# Patient Record
Sex: Female | Born: 1935 | Race: White | Hispanic: No | Smoking: Former smoker
Health system: Southern US, Community
[De-identification: ages and names within clinical notes are randomized; demographics above are authoritative.]

## PROBLEM LIST (undated history)

## (undated) DIAGNOSIS — J189 Pneumonia, unspecified organism: Secondary | ICD-10-CM

## (undated) DIAGNOSIS — R51 Headache: Secondary | ICD-10-CM

## (undated) DIAGNOSIS — I739 Peripheral vascular disease, unspecified: Secondary | ICD-10-CM

## (undated) DIAGNOSIS — I10 Essential (primary) hypertension: Secondary | ICD-10-CM

## (undated) DIAGNOSIS — R112 Nausea with vomiting, unspecified: Secondary | ICD-10-CM

## (undated) DIAGNOSIS — T7840XA Allergy, unspecified, initial encounter: Secondary | ICD-10-CM

## (undated) DIAGNOSIS — E785 Hyperlipidemia, unspecified: Secondary | ICD-10-CM

## (undated) DIAGNOSIS — I4891 Unspecified atrial fibrillation: Secondary | ICD-10-CM

## (undated) DIAGNOSIS — M810 Age-related osteoporosis without current pathological fracture: Secondary | ICD-10-CM

## (undated) DIAGNOSIS — Z9889 Other specified postprocedural states: Secondary | ICD-10-CM

## (undated) DIAGNOSIS — K219 Gastro-esophageal reflux disease without esophagitis: Secondary | ICD-10-CM

## (undated) DIAGNOSIS — I499 Cardiac arrhythmia, unspecified: Secondary | ICD-10-CM

## (undated) HISTORY — PX: FRACTURE SURGERY: SHX138

## (undated) HISTORY — PX: CARDIAC ELECTROPHYSIOLOGY MAPPING AND ABLATION: SHX1292

## (undated) HISTORY — PX: BUNIONECTOMY: SHX129

## (undated) HISTORY — DX: Allergy, unspecified, initial encounter: T78.40XA

## (undated) HISTORY — DX: Hyperlipidemia, unspecified: E78.5

## (undated) HISTORY — DX: Age-related osteoporosis without current pathological fracture: M81.0

## (undated) HISTORY — DX: Essential (primary) hypertension: I10

## (undated) SURGERY — Surgical Case
Anesthesia: *Unknown

---

## 1999-09-24 ENCOUNTER — Ambulatory Visit (HOSPITAL_COMMUNITY): Admission: RE | Admit: 1999-09-24 | Discharge: 1999-09-24 | Payer: Self-pay | Admitting: Obstetrics & Gynecology

## 2006-05-06 ENCOUNTER — Encounter: Admission: RE | Admit: 2006-05-06 | Discharge: 2006-05-06 | Payer: Self-pay | Admitting: Cardiology

## 2006-05-12 ENCOUNTER — Encounter (INDEPENDENT_AMBULATORY_CARE_PROVIDER_SITE_OTHER): Payer: Self-pay | Admitting: Cardiology

## 2006-05-12 ENCOUNTER — Ambulatory Visit (HOSPITAL_COMMUNITY): Admission: RE | Admit: 2006-05-12 | Discharge: 2006-05-12 | Payer: Self-pay | Admitting: Cardiology

## 2007-01-08 ENCOUNTER — Emergency Department (HOSPITAL_COMMUNITY): Admission: EM | Admit: 2007-01-08 | Discharge: 2007-01-08 | Payer: Self-pay | Admitting: Emergency Medicine

## 2007-09-14 ENCOUNTER — Ambulatory Visit (HOSPITAL_COMMUNITY): Admission: RE | Admit: 2007-09-14 | Discharge: 2007-09-14 | Payer: Self-pay | Admitting: Cardiology

## 2007-09-22 ENCOUNTER — Encounter: Admission: RE | Admit: 2007-09-22 | Discharge: 2007-09-22 | Payer: Self-pay | Admitting: Cardiology

## 2008-01-02 ENCOUNTER — Encounter: Admission: RE | Admit: 2008-01-02 | Discharge: 2008-01-02 | Payer: Self-pay | Admitting: Cardiology

## 2008-07-13 ENCOUNTER — Emergency Department (HOSPITAL_COMMUNITY): Admission: EM | Admit: 2008-07-13 | Discharge: 2008-07-13 | Payer: Self-pay | Admitting: Emergency Medicine

## 2008-09-25 ENCOUNTER — Ambulatory Visit (HOSPITAL_COMMUNITY): Admission: RE | Admit: 2008-09-25 | Discharge: 2008-09-25 | Payer: Self-pay | Admitting: Cardiology

## 2009-01-18 ENCOUNTER — Inpatient Hospital Stay (HOSPITAL_COMMUNITY): Admission: AD | Admit: 2009-01-18 | Discharge: 2009-01-23 | Payer: Self-pay | Admitting: Cardiology

## 2009-03-25 ENCOUNTER — Ambulatory Visit (HOSPITAL_COMMUNITY): Admission: RE | Admit: 2009-03-25 | Discharge: 2009-03-25 | Payer: Self-pay | Admitting: Cardiology

## 2009-03-25 ENCOUNTER — Encounter (INDEPENDENT_AMBULATORY_CARE_PROVIDER_SITE_OTHER): Payer: Self-pay | Admitting: Cardiology

## 2009-08-23 DIAGNOSIS — G43709 Chronic migraine without aura, not intractable, without status migrainosus: Secondary | ICD-10-CM | POA: Insufficient documentation

## 2010-06-19 ENCOUNTER — Ambulatory Visit (HOSPITAL_COMMUNITY)
Admission: AD | Admit: 2010-06-19 | Discharge: 2010-06-19 | Payer: Self-pay | Source: Home / Self Care | Admitting: Cardiology

## 2010-10-09 ENCOUNTER — Ambulatory Visit (HOSPITAL_COMMUNITY)
Admission: RE | Admit: 2010-10-09 | Discharge: 2010-10-09 | Payer: Self-pay | Source: Home / Self Care | Attending: Cardiovascular Disease | Admitting: Cardiovascular Disease

## 2010-12-22 LAB — BASIC METABOLIC PANEL
BUN: 21 mg/dL (ref 6–23)
CO2: 27 mEq/L (ref 19–32)
Chloride: 110 mEq/L (ref 96–112)
GFR calc non Af Amer: >60 mL/min (ref >60)
Potassium: 4.5 mEq/L (ref 3.5–5.1)
Sodium: 144 mEq/L (ref 135–145)

## 2010-12-22 LAB — TSH: TSH: 1.675 u[IU]/mL (ref 0.350–4.500)

## 2010-12-22 LAB — CBC
HCT: 47.4 % — ABNORMAL HIGH (ref 36.0–46.0)
Hemoglobin: 15.8 g/dL — ABNORMAL HIGH (ref 12.0–15.0)
MCHC: 33.3 g/dL (ref 30.0–36.0)
Platelets: 151 10*3/uL (ref 150–400)

## 2010-12-22 LAB — PROTIME-INR: Prothrombin Time: 26.2 seconds — ABNORMAL HIGH (ref 11.6–15.2)

## 2010-12-22 LAB — MAGNESIUM: Magnesium: 2.2 mg/dL (ref 1.5–2.5)

## 2010-12-25 LAB — CBC
HCT: 48.7 % — ABNORMAL HIGH (ref 36.0–46.0)
MCH: 30.3 pg (ref 26.0–34.0)
MCHC: 33.1 g/dL (ref 30.0–36.0)
MCV: 91.7 fL (ref 78.0–100.0)
RDW: 14.4 % (ref 11.5–15.5)

## 2010-12-25 LAB — BASIC METABOLIC PANEL
BUN: 20 mg/dL (ref 6–23)
GFR calc Af Amer: >60 mL/min (ref >60)
Sodium: 142 mEq/L (ref 135–145)

## 2011-01-19 LAB — PROTIME-INR
INR: 2.5 — ABNORMAL HIGH (ref 0.00–1.49)
Prothrombin Time: 28.4 seconds — ABNORMAL HIGH (ref 11.6–15.2)

## 2011-01-21 LAB — DIFFERENTIAL
Basophils Absolute: 0 10*3/uL (ref 0.0–0.1)
Basophils Relative: 0 % (ref 0–1)
Eosinophils Absolute: 0.3 10*3/uL (ref 0.0–0.7)
Eosinophils Relative: 5 % (ref 0–5)
Lymphocytes Relative: 21 % (ref 12–46)
Monocytes Absolute: 0.8 10*3/uL (ref 0.1–1.0)
Monocytes Relative: 12 % (ref 3–12)
Neutro Abs: 4.5 10*3/uL (ref 1.7–7.7)
Neutrophils Relative %: 55 % (ref 43–77)

## 2011-01-21 LAB — BASIC METABOLIC PANEL
BUN: 26 mg/dL — ABNORMAL HIGH (ref 6–23)
BUN: 26 mg/dL — ABNORMAL HIGH (ref 6–23)
BUN: 35 mg/dL — ABNORMAL HIGH (ref 6–23)
BUN: 36 mg/dL — ABNORMAL HIGH (ref 6–23)
CO2: 31 mEq/L (ref 19–32)
Calcium: 8.8 mg/dL (ref 8.4–10.5)
Calcium: 9.2 mg/dL (ref 8.4–10.5)
Calcium: 9.2 mg/dL (ref 8.4–10.5)
Chloride: 101 mEq/L (ref 96–112)
Creatinine, Ser: 1.14 mg/dL (ref 0.4–1.2)
Creatinine, Ser: 1.17 mg/dL (ref 0.4–1.2)
Creatinine, Ser: 2.08 mg/dL — ABNORMAL HIGH (ref 0.4–1.2)
GFR calc Af Amer: 28 mL/min — ABNORMAL LOW (ref >60)
GFR calc non Af Amer: 23 mL/min — ABNORMAL LOW (ref >60)
GFR calc non Af Amer: 36 mL/min — ABNORMAL LOW (ref >60)
GFR calc non Af Amer: 45 mL/min — ABNORMAL LOW (ref >60)
Glucose, Bld: 111 mg/dL — ABNORMAL HIGH (ref 70–99)
Glucose, Bld: 118 mg/dL — ABNORMAL HIGH (ref 70–99)
Potassium: 4.3 mEq/L (ref 3.5–5.1)
Sodium: 140 mEq/L (ref 135–145)

## 2011-01-21 LAB — CARDIAC PANEL(CRET KIN+CKTOT+MB+TROPI)
CK, MB: 1.7 ng/mL (ref 0.3–4.0)
CK, MB: 2 ng/mL (ref 0.3–4.0)
CK, MB: 2.3 ng/mL (ref 0.3–4.0)
Total CK: 66 U/L (ref 7–177)
Total CK: 72 U/L (ref 7–177)
Total CK: 81 U/L (ref 7–177)
Troponin I: <0.01 ng/mL (ref 0.00–0.06)
Troponin I: <0.01 ng/mL (ref 0.00–0.06)

## 2011-01-21 LAB — CBC
HCT: 41 % (ref 36.0–46.0)
Hemoglobin: 14 g/dL (ref 12.0–15.0)
Hemoglobin: 14.6 g/dL (ref 12.0–15.0)
MCHC: 33.7 g/dL (ref 30.0–36.0)
MCHC: 34.1 g/dL (ref 30.0–36.0)
Platelets: 171 10*3/uL (ref 150–400)
RBC: 4.38 MIL/uL (ref 3.87–5.11)
RBC: 4.54 MIL/uL (ref 3.87–5.11)
RDW: 13.7 % (ref 11.5–15.5)
WBC: 4.8 10*3/uL (ref 4.0–10.5)
WBC: 6.8 10*3/uL (ref 4.0–10.5)

## 2011-01-21 LAB — PROTIME-INR
INR: 2 — ABNORMAL HIGH (ref 0.00–1.49)
INR: 2.1 — ABNORMAL HIGH (ref 0.00–1.49)
INR: 2.2 — ABNORMAL HIGH (ref 0.00–1.49)
Prothrombin Time: 23.5 seconds — ABNORMAL HIGH (ref 11.6–15.2)
Prothrombin Time: 24.8 seconds — ABNORMAL HIGH (ref 11.6–15.2)
Prothrombin Time: 25 seconds — ABNORMAL HIGH (ref 11.6–15.2)
Prothrombin Time: 25.9 seconds — ABNORMAL HIGH (ref 11.6–15.2)

## 2011-01-21 LAB — COMPREHENSIVE METABOLIC PANEL
ALT: 18 U/L (ref 0–35)
AST: 25 U/L (ref 0–37)
CO2: 31 mEq/L (ref 19–32)
Calcium: 9.7 mg/dL (ref 8.4–10.5)
Chloride: 99 mEq/L (ref 96–112)
GFR calc non Af Amer: 55 mL/min — ABNORMAL LOW (ref >60)

## 2011-01-21 LAB — BRAIN NATRIURETIC PEPTIDE
Pro B Natriuretic peptide (BNP): 243 pg/mL — ABNORMAL HIGH (ref 0.0–100.0)
Pro B Natriuretic peptide (BNP): 296 pg/mL — ABNORMAL HIGH (ref 0.0–100.0)
Pro B Natriuretic peptide (BNP): 91 pg/mL (ref 0.0–100.0)

## 2011-02-24 NOTE — Cardiovascular Report (Signed)
NAMEHANIFA, ANTONETTI NO.:  [id number]   MEDICAL RECORD NO.:  [id number]          PATIENT TYPE:  OIB   LOCATION:  2854                         FACILITY:  [facility]   PHYSICIAN:  Cristy Hilts. Jacinto Halim, MD       DATE OF BIRTH:  08/24/1928   DATE OF PROCEDURE:  09/14/2007  DATE OF DISCHARGE:                            CARDIAC CATHETERIZATION   PROCEDURE PERFORMED:  Direct current cardioversion.   INDICATION:  Atrial fibrillation, shortness of breath and fatigue.   TECHNIQUE:  Using Pentothal for deep sedation, initially 100 joules of  synchronized direct current cardioversion was delivered to the patient  which was unsuccessful, and then 120 joules x2 was delivered with  successful cardioversion from atrial fibrillation to sinus bradycardia.  Patient tolerated the procedure.  No immediate complications noted.      Cristy Hilts. Jacinto Halim, MD  Electronically Signed     JRG/MEDQ  D:  09/14/2007  T:  09/14/2007  Job:  [id number]

## 2011-02-24 NOTE — H&P (Signed)
NAMEAUDRY, Lutz                  ACCOUNT NO.:  [id number]   MEDICAL RECORD NO.:  [id number]          PATIENT TYPE:  INP   LOCATION:                               FACILITY:  [facility]   PHYSICIAN:  Vonna Kotyk R. Jacinto Halim, MD       DATE OF BIRTH:  1928-03-04   DATE OF ADMISSION:  01/18/2009  DATE OF DISCHARGE:                              HISTORY & PHYSICAL   REASON FOR ADMISSION:  1. Intractable back pain from spinal stenosis.  2. Acute on chronic diastolic heart failure.   HISTORY:  [name] is a pleasant 77 year old female with  paroxysmal atrial fibrillation.  She has recently undergone direct  current cardioversion sometime in December - January 2009 and 2010.  Since then she was also placed on Multaz for maintaining sinus rhythm.  She is extremely symptomatic with atrial fibrillation.  She was doing  well until about a month ago when she started noticing increasing lower  extremity edema.  She was recently in [place] about 2 weeks ago where  she had increasing shortness of breath, increasing lower extremity edema  where she went to the emergency room and was found to have mild  congestive heart failure.  She was eventually discharged home with  reassurance and to follow-up with orthopedic surgery for her severe back  pain and spinal stenosis.  She has been taking fentanyl patch and also  Percocet without any relief of her back pain.   The patient describes shortness of breath as a continuous increase on  lying down over the last 3-4 weeks but has especially gotten worse in  the last 1 week.  She was seen by my partner about 2 to 3 days ago and  was placed on Lasix.  With this she has had significant improvement in  her lower extremity edema but however she is still extremely short of  breath.   More importantly her issues have been severe back pain and inability to  walk to the bathroom without having to scream out loud.  She was seen by  Dr. Ethelene Hal and  was advised back surgery  because of severe spinal  stenosis and complex spinal stenosis.  She has had radicular  pain in  both the lower extremities this for the last 2-3 weeks and it has been  getting worse.  She has not had any bowel or bladder incontinence.   REVIEW OF SYSTEMS:  She has not had any bleeding diathesis on Coumadin.  She has no significant bowel or bladder disorder.  No neurologic  weakness.  She has not had any chest pain.  Other symptoms are negative.   PRESENT MEDICATIONS:  1. Coumadin 5 mg p.o. daily as per INR.  2. Lipitor 10 mg p.o. daily.  3. Timolol 10 mg p.o. b.i.d.  4. Calcium 600  mg p.o. b.i.d.  5. Fish oil capsule 1200 mg p.o. daily.  6. Allegra 180 mg p.o. daily.  7. Effexor XR 150 mg p.o. daily.  8. Multaq 400 mg p.o. b.i.d..  9. Fentanyl patch 50 mcg q.72 h.  10.Neurontin 300 mg p.o. b.i.d..  11.Actonel 70 mg p.o. q. 1 week.  12.Lasix 20 mg p.o. daily.  13.Klor-Con 10 mEq p.o. daily.  14.Vitamin D 50,000 International  Units p.o. daily.  15.Oxycodone, 10/325 p.o. daily.  16.Hydrocodone 5/500 q.6h p.r.n.Marland Kitchen   ALLERGIES:  IVP CONTRAST, CODEINE, SULFA AND LATEX.   PHYSICAL EXAMINATION:  She is well- built and well-nourished.  She  appears to be in acute distress.  VITAL SIGNS:  Include a heart rate of 58 beats per minute, respirations  16, blood pressure 130/86 mmHg.  Weight of 184 pounds.  CARDIOVASCULAR:  S1-S2 was normal without any gallops or murmur.  CHEST:  Clear.  ABDOMEN:  Soft.  EXTREMITIES:  Edema.  Peripheral arterial exam was normal.   IMPRESSION:  1. Severe spinal stenosis with intractable back pain with radicular      pain to her legs.  2. Acute on chronic diastolic heart failure.  3. Paroxysmal atrial fibrillation maintaining sinus rhythm.  4. Fluid overload state.  I suspect Multaq is  probably contributing      to her lower extremity edema.   RECOMMENDATIONS:  Given the patient is actually very minimal because of  pain in spite of being on narcotic,  we are going to admit her to the  hospital for pain control and also for control of her acute on chronic  diastolic heart failure.  I have discussed these findings with Dr. Ethelene Hal  who is going to have his partner, Dr. Shon Baton to see the patient in the  inpatient setting.   If Dr. Shon Baton feels that inpatient spinal surgery is required, she can  certainly undergo this surgery with acceptable cardiovascular risk.   I am going to hold off on her Multaq as I  think it has caused her to  have fluid overload state.   Her last echocardiogram was in January 18, 2009 which revealed normal LV  systolic function.   Her last stress test was on July 18, 2008 which  revealed no evidence  of ischemia and normal ejection fraction.  Please see orders for  medications and lab.      Cristy Hilts. Jacinto Halim, MD  Electronically Signed     JRG/MEDQ  D:  01/18/2009  T:  01/18/2009  Job:  [id number]

## 2011-02-24 NOTE — Op Note (Signed)
[name], [name] NO.:  [id number]   MEDICAL RECORD NO.:  [id number]          PATIENT TYPE:  OIB   LOCATION:  2899                         FACILITY:  [facility]   PHYSICIAN:  Cristy Hilts. Jacinto Halim, MD       DATE OF BIRTH:  04-24-1928   DATE OF PROCEDURE:  09/25/2008  DATE OF DISCHARGE:  09/25/2008                               OPERATIVE REPORT   PROCEDURE PERFORMED:  Synchronized direct current cardioversion for  atrial fibrillation, new onset.   HISTORY:  [name] is a 77 year old female with history of paroxysmal  atrial fibrillation.  She gets extremely symptomatic with generalized  fatigue, not feeling well with recurrent atrial fibrillation.  She had  undergone cardioversion in 2007 with was successful and had maintained  sinus rhythm, recently has been having short runs of palpitations and  generalized weakness.  However, about a week ago, she developed  significant marked lethargy and she was found to be in atrial  fibrillation.  Now, she is loaded with Multaq 4 mg p.o. b.i.d. and she  is brought in for an outpatient direct current cardioversion.  Her INR  has been therapeutic, and she has been on chronic Coumadin.   PROCEDURE DATA:  Using 90 mg of propofol for achieving deep sedation,  120 joules of synchronized biphasic direct current  was delivered to the  patient with successful cardioversion of atrial fibrillation to normal  sinus rhythm.  The patient tolerated the procedure well.  No immediate  complications.      Cristy Hilts. Jacinto Halim, MD  Electronically Signed     JRG/MEDQ  D:  09/25/2008  T:  09/26/2008  Job:  [id number]   cc:   Larina Earthly, M.D.

## 2011-02-24 NOTE — H&P (Signed)
[name], BUSSA NO.:  [id number]   MEDICAL RECORD NO.:  [id number]          PATIENT TYPE:  AMB   LOCATION:  ENDO                         FACILITY:  [facility]   PHYSICIAN:  Ritta Slot, MD     DATE OF BIRTH:  04/27/1928   DATE OF ADMISSION:  03/25/2009  DATE OF DISCHARGE:  03/25/2009                              HISTORY & PHYSICAL   This is a TEE-guided cardioversion.   PATIENT PROFILE:  [name] is a very pleasant 77 year old Caucasian  female who has had paroxysmal atrial fibrillation for a year.  She has  been treated with medical therapy including amiodarone, Multaq, and now  sotalol 120 mg p.o. b.i.d.  She has been seen by EP for evaluation of  atrial fibrillation and it was decided to proceed with cardioversion.  Initially, she should maintain sinus rhythm.  She is symptomatic,  treated with severe lethargy and shortness of breath.  After TEE was  performed, which demonstrated no evidence of LAA thrombus, but simply  small amount of smoke with a pulse wave velocity of 35 cm/sec in the  left atrial appendage, it was decided to proceed with cardioversion.   She was given 40 mg of propofol intravenously by general anesthesia Dr.  Michelle Piper.  She underwent 100 joules synchronized shock x1 restoring normal  sinus rhythm straight away.  She remained in normal sinus rhythm without  any return of atrial fibrillation.   PLAN:  She will be discharged home after 2 hours and continue on with  the medication.  She will follow up with Dr. Jacinto Halim in due course in the  next couple of weeks.      Ritta Slot, MD  Electronically Signed     HS/MEDQ  D:  03/25/2009  T:  03/26/2009  Job:  [id number]

## 2011-02-24 NOTE — Consult Note (Signed)
[name], Lutz NO.:  [id number]   MEDICAL RECORD NO.:  [id number]          PATIENT TYPE:  INP   LOCATION:  4728                         FACILITY:  [facility]   PHYSICIAN:  Alvy Beal, MD    DATE OF BIRTH:  1928/03/28   DATE OF CONSULTATION:  01/22/2009  DATE OF DISCHARGE:                                 CONSULTATION   REASON FOR CONSULTATION:  Bilateral lower extremity burning  dysesthesias.  History obtained from the patient, her daughter, and the  medical records.   HISTORY OF PRESENT ILLNESS:  [name] is a very active 77 year old woman who  has never complained of any significant back pain.  She would have  occasional discomfort but nothing of any great severity that would  require her to see a physician.  She has a history of acute on chronic  diastolic heart failure and in December or early January was started on  medication called, Multaq.  She states that she had a cardioversion and  was started on the medication.  Shortly after starting this medication,  she started noting nighttime burning dysesthesias.  During the day, she  would have no symptoms.  She was able to be active.  She was able to  ambulate.  She had no difficulty bending, twisting, turning, and never  complained of any significant back pain.  The pain persisted despite  medications.  Ultimately, she was on vacation with her daughter in [place]  and the pain got so severe that an MRI was done.  She recently returned  here and because of the severity of the symptoms was admitted on January 18, 2009.  The Multaq was eventually stopped.  She has now been off that  medication for the last 5-7 days.  She reports that since being off the  medication, nighttime burning dysesthesias that she was suffering with  have begun to improve.  There is still symptoms but overall she is  noting improvement for the first time since she began the medication.   Because the MRI showed significant changes,  Orthopedic Spine  consultation was requested.   At no point in time that she noted history of back pain.   PAST MEDICAL HISTORY:  1. Recent diagnosis of severe spinal stenosis.  2. Acute on chronic diastolic heart failure.  3. Paroxysmal AFib, maintaining sinus rhythm.  4. Fluid overload.   MEDICATIONS:  States she is on Coumadin, Lipitor, timolol, calcium, fish  oil, Allegra, and Effexor.  She recently stopped the Multaq and fentanyl  patch which was also recently discontinued.  Neurontin which was causing  her some balance problems and Actonel, Lasix, Klor-Con, and vitamin D.   She is also on Vicodin and was on Percocet in the past.   ALLERGIES:  She is allergic to IVP CONTRAST, CODEINE, SULFA, and LATEX.   PHYSICAL EXAMINATION:  She is a pleasant woman who is alert and oriented  x3.  Cranial nerves II-XII were tested, they are intact.  No thoracic,  lumbar or cervical pain with range of motion.  She has excellent forward  flexion and extension without pain.  She has no pain or discomfort with  direct palpation.  She has no hip, knee or ankle pain with isolated  joint range of motion.  Negative nerve root tension signs in the lower  extremity.  2+ symmetrical deep tendon reflexes bilaterally.  Absent  clonus.  Negative Babinski.  No Achilles reflex.  Intact distal pulses  in the lower extremity.   IMAGING:  Her MRI from [facility] was reviewed.  It does show a grade 2  degenerative spondylolisthesis at L4-L5 with posterior disk protrusion  causing significant spinal stenosis.   IMPRESSION AND PLAN:  At this point in time, the patient reports no  acute traumatic event or significant back pain.  The MRI findings I  believe are more of a chronic condition and although they are  significantly based on MRI, clinically she has no symptoms that would  relate to them.  She has no radiculopathy, she has no focal neurological  deficits, and she has no radicular leg pain.  More importantly,  there is  also no significant back pain.  At this point in time, I do not feel as  though she has intractable back pain.  My recommendation is to convert  her to Lyrica for the burning dysesthesias that she has and using oral  pain medication.  I would stop the IV Dilaudid, the fentanyl patch, and  continue her generalized therapy program.  If she does develop back pain  or significant radiculopathy, then she may require something surgical to  be done, but at this point I do not feel her symptoms are related to her  spine.  More than likely what has occurred as a result of a side effect  of the Multaq, she developed a peripheral neuropathy and has been  complaining of dysesthesias related to medicine side effect.  Once this  medicine was stopped, her symptoms have begun to resolve.  I am happy to  see her again in the future in about 2 months to track her overall  performance.  I will set her up with physical therapy to be done as an  outpatient and more than likely if she does well on the oral medications  we prescribed today, she can be discharged tomorrow.      Alvy Beal, MD  Electronically Signed     DDB/MEDQ  D:  01/22/2009  T:  01/23/2009  Job:  [id number]

## 2011-02-24 NOTE — Discharge Summary (Signed)
[name], [name]                  ACCOUNT NO.:  [id number]   MEDICAL RECORD NO.:  [id number]          PATIENT TYPE:  INP   LOCATION:  4728                         FACILITY:  [facility]   PHYSICIAN:  Cristy Hilts. Jacinto Halim, MD       DATE OF BIRTH:  March 25, 1928   DATE OF ADMISSION:  01/18/2009  DATE OF DISCHARGE:  01/23/2009                               DISCHARGE SUMMARY   DISCHARGE DIAGNOSES:  1. Intractable back pain improved secondary to spinal stenosis.  2. Acute on chronic diastolic heart failure resolved.  3. Volume overload secondary to Multaq.  4. Paroxysmal atrial fibrillation, now back on Cardizem and      maintaining sinus bradycardia at discharge.  5. Mild renal insufficiency, improved at discharge.  6. Anticoagulation with Coumadin, stable during hospitalization.  7. Severe chronic pain, now improved.   DISCHARGE CONDITION:  Improved.   PROCEDURES:  None.   DISCHARGE MEDICATIONS:  1. Lipitor 10 mg at bedtime as before.  2. Timolol 10 mg twice a day.  3. Effexor XR 150 mg every morning.  4. Coumadin 2.5 mg every evening.  5. Stop Multaq.  6. Lasix 20 mg as needed daily.  7. Potassium 10 mEq as needed daily which she will continue.  8. Calcium 600 plus vitamin D daily.  9. Percocet 5/325 one tablet every 4-6 hours as needed.  10.Fish oil as before 1200 mg daily.  11.Cardizem CD 120 mg daily this is new.  12.Allegra 180 mg daily as before.  13.Robaxin 500 mg 1 every 8 hours as needed for spasms.  14.Lyrica 75 mg one every evening.  15.Actonel weekly as before.  16.Vitamin D 50,000 units weekly as before.  17.Stop fentanyl, stop Neurontin, stop Cafergot, stop oxycodone, and      stop hydrocodone.   DISCHARGE INSTRUCTIONS:  Low-sodium heart-healthy diet.  May walk up  steps.  May shower.   FOLLOWUP:  1. Follow up with Dr. Jacinto Halim on February 01, 2009 at 10:45.  Please      reschedule as this is not a good time for you.  2. Follow up with Dr. Shon Baton in 4 weeks.   HISTORY OF  PRESENT ILLNESS:  A 77 year old female with history of atrial  fib, was seen by Dr. Jacinto Halim in the office on January 18, 2009 for  intractable back pain from spinal stenosis.  She has a history of PAF  and undergone direct cardioversion in December or January.  Since then,  she had been placed on Multaq for sinus rhythm.  The patient has  multiple symptoms with her AFib and had been doing well until a month  prior to the admission, and she began having increasing lower extremity  edema, then increasing shortness of breath.  She was placed on Lasix and  potassium, and she also had severe back pain and spinal stenosis and is  on fentanyl patch and Percocet without relief.  Shortness of breath  continued.  Her back pain was severe, inability to walk to the bathroom  without screaming.  She was admitted by Dr. Jacinto Halim for further  management.   ALLERGIES:  IVP CONTRAST, CODEINE, SULFA, and LATEX.   The patient was admitted and placed on IV Dilaudid.  During the weekend,  she had increasing back pain.  Also, her creatinine started to climb  slightly, so her Lasix was decreased and she was improving from diuretic  standpoint as well as from edema.  Has a history of EF greater than 55%.  The patient would have episodic PAF.  We stopped her Multaq due to the  edema and she was stabilizing.  Ortho consult with Dr. Shon Baton was done  with medication adjustment, stopping most pain medicines and then slowly  adding back.   The patient slowly improved and actually at discharge, she was stable.  She had had some atrial fib the morning of discharge.  She was started  on Cardizem CD 120.  She walked and her heart rate remained sinus  rhythm, so she was felt stable for discharge though if she is home and  has continued episodes of PAF, she would most likely need to go home on  sotalol.   PHYSICAL EXAMINATION:  VITAL SIGNS:  At discharge, blood pressure  123/81, pulse 82, temperature 97.2, sat oxygen stable.   HEART:  On discharge, heart sounds.  LUNGS:  Clear.  No rhonchi.  LOWER EXTREMITIES:  Without edema.  ABDOMEN:  Soft, nontender.  Positive bowel sounds.   LABORATORY DATA:  On admission, hemoglobin 14.6, hematocrit 43, WBC 4.8,  platelets 161 and prior to discharge, hemoglobin 13, hematocrit 40, WBC  6.8, platelets 171.  PT on admission 23.5, INR of 2, this maintained  therapeutic and at discharge, her protime is 24.8 with INR of 2.1.   Comprehensive metabolic panel on admission, sodium 138, potassium 3.7,  chloride 99, CO2 of 31, glucose 129, BUN 23, creatinine 0.98, total bili  0.6, alkaline phos 70, SGOT 25, SGPT 18, calcium 9.7.  BNP was 296 on  admission, the next day 243 and prior to discharge 91.   Follow-up basic metabolic panel was stable and at discharge, sodium 140,  potassium 4.4, chloride 101, CO2 of 31, glucose 96, BUN 26, creatinine  1.14.   Cardiac panels were negative for MI with CK 72, MB 2 and troponin I less  than 0.01.   RADIOLOGY:  Chest x-ray on admission, mild cardiac enlargement without  acute pulmonary process.   EKGs, initially sinus rhythm.  No acute changes.  Prior to discharge,  she had some atrial fib, no acute ST changes.   HOSPITAL COURSE:  As stated, the patient was admitted, underwent  diuresing for volume overload secondary to Multaq.  Her Multaq was  discontinued.  She did have some paroxysmal AFib and was started on p.o.  Cardizem.  Her back pain was her biggest problem during the  hospitalization.  Ortho consult was obtained and medication  adjustments with discontinuing fentanyl patch, Neurontin, oxycodone and  hydrocodone.  She was started on Lyrica with understanding she did not  want to be on that any long period of time.  The patient will follow up  as an outpatient as well as with Physical Therapy through Dr. Shon Baton.      Darcella Gasman. [place], N.P.      Cristy Hilts. Jacinto Halim, MD  Electronically Signed    LRI/MEDQ  D:  01/23/2009  T:   01/24/2009  Job:  [id number]   cc:   Alvy Beal, MD  Larina Earthly, M.D.  Dr. Ethelene Hal

## 2011-02-27 NOTE — Op Note (Signed)
NAMECHERRI, YERA                  ACCOUNT NO.:  [id number]   MEDICAL RECORD NO.:  [id number]          PATIENT TYPE:  AMB   LOCATION:  ENDO                         FACILITY:  [facility]   PHYSICIAN:  Vonna Kotyk R. Jacinto Halim, MD       DATE OF BIRTH:  01-19-1928   DATE OF PROCEDURE:  DATE OF DISCHARGE:                                 OPERATIVE REPORT   SURGEON:  Vonna Kotyk R. Jacinto Halim, MD.   PROCEDURE:  Transesophageal esophageal echocardiographically guided  electrical cardioversion.   INDICATIONS:  Miss Wendt is a 77 year old female with new onset of atrial  fibrillation.  Given the fact this is new onset atrial fibrillation and she  is well anticoagulated beyond 2 weeks, she was brought to the endoscopy  suite for a TEE-guided cardioversion.   DESCRIPTION OF PROCEDURE:  After the clot in the left atrial appendage and  the left atrium was ruled out, a direct-current  cardioversion was performed  using Pentothal for deep sedation; 80 mg Pentothal was administered  intravenously.  Direct-current cardioversion was performed in a synchronized  fashion using a biphasic defibrillator initially at 100 J x1, then 120 J x2,  with successful cardioversion from atrial fibrillation to normal sinus  rhythm.  The patient tolerated the procedure.  No immediate complication  noted.      Cristy Hilts. Jacinto Halim, MD  Electronically Signed     JRG/MEDQ  D:  05/12/2006  T:  05/12/2006  Job:  [id number]   cc:   Larina Earthly, M.D.

## 2011-04-02 ENCOUNTER — Ambulatory Visit (HOSPITAL_COMMUNITY)
Admission: RE | Admit: 2011-04-02 | Discharge: 2011-04-02 | Disposition: A | Discharge status: Home / Self Care | Payer: Medicare Other | Source: Ambulatory Visit | Attending: Cardiology | Admitting: Cardiology

## 2011-04-02 DIAGNOSIS — Z79899 Other long term (current) drug therapy: Secondary | ICD-10-CM | POA: Insufficient documentation

## 2011-04-02 DIAGNOSIS — M810 Age-related osteoporosis without current pathological fracture: Secondary | ICD-10-CM | POA: Insufficient documentation

## 2011-04-02 DIAGNOSIS — Z7901 Long term (current) use of anticoagulants: Secondary | ICD-10-CM | POA: Insufficient documentation

## 2011-04-02 DIAGNOSIS — I4891 Unspecified atrial fibrillation: Secondary | ICD-10-CM | POA: Insufficient documentation

## 2011-04-02 DIAGNOSIS — F329 Major depressive disorder, single episode, unspecified: Secondary | ICD-10-CM | POA: Insufficient documentation

## 2011-04-02 DIAGNOSIS — F3289 Other specified depressive episodes: Secondary | ICD-10-CM | POA: Insufficient documentation

## 2011-04-02 DIAGNOSIS — E7800 Pure hypercholesterolemia, unspecified: Secondary | ICD-10-CM | POA: Insufficient documentation

## 2011-04-07 NOTE — Op Note (Signed)
  NAMESOCORRO, KANITZ NO.:  [id number]  MEDICAL RECORD NO.:  [id number]  LOCATION:  [facility]                         FACILITY:  [facility]  PHYSICIAN:  Pamella Pert, MD DATE OF BIRTH:  09-14-1928  DATE OF PROCEDURE:  04/02/2011 DATE OF DISCHARGE:                              OPERATIVE REPORT   PROCEDURE PERFORMED:  Symptomatic direct current cardioversion.  INDICATION:  Ms. Jeslin Bazinet is a patient with paroxysmal atrial fibrillation.  She gets extremely symptomatic with atrial fibrillation and has had at least 4 cardioversions in the past for paroxysmal atrial fibrillation.  She is presently maintained on sinus rhythm on sotalol. However, she presented to my office to establish with me and to was back in atrial fibrillation, surely enough.  EKG demonstrates atrial fibrillation.  After explaining the risks, benefits, and alternatives, [facility].TECHNIQUE:  Using 100 mg of Pentothal to achieve deep sedation, synchronized direct current cardioversion was performed by generating 100 joules of direct current.  She converted to sinus rhythm from atrial fibrillation with first shock.  She tolerated the procedure well.  No immediate complications were noted.  RECOMMENDATIONS:  She will be discharged home today with outpatient followup.  I may consider her for EP ablation, which she has considered in the past.  I may even consider increasing her sotalol to 120 mg twice a day.     Pamella Pert, MD     JRG/MEDQ  D:  04/02/2011  T:  04/03/2011  Job:  [id number]  Electronically Signed by Yates Decamp MD on 04/07/2011 10:23:02 AM

## 2011-04-29 ENCOUNTER — Ambulatory Visit (HOSPITAL_COMMUNITY)
Admission: RE | Admit: 2011-04-29 | Discharge: 2011-04-29 | Disposition: A | Discharge status: Home / Self Care | Payer: Medicare Other | Source: Ambulatory Visit | Attending: Cardiology | Admitting: Cardiology

## 2011-04-29 DIAGNOSIS — M129 Arthropathy, unspecified: Secondary | ICD-10-CM | POA: Insufficient documentation

## 2011-04-29 DIAGNOSIS — Z7901 Long term (current) use of anticoagulants: Secondary | ICD-10-CM | POA: Insufficient documentation

## 2011-04-29 DIAGNOSIS — I4891 Unspecified atrial fibrillation: Secondary | ICD-10-CM | POA: Insufficient documentation

## 2011-04-29 DIAGNOSIS — Z79899 Other long term (current) drug therapy: Secondary | ICD-10-CM | POA: Insufficient documentation

## 2011-04-29 LAB — CBC
HCT: 46.8 % — ABNORMAL HIGH (ref 36.0–46.0)
Hemoglobin: 15.4 g/dL — ABNORMAL HIGH (ref 12.0–15.0)
MCH: 29.7 pg (ref 26.0–34.0)
MCHC: 32.9 g/dL (ref 30.0–36.0)
RDW: 14.3 % (ref 11.5–15.5)

## 2011-04-29 LAB — BASIC METABOLIC PANEL
BUN: 27 mg/dL — ABNORMAL HIGH (ref 6–23)
CO2: 24 mEq/L (ref 19–32)
Chloride: 108 mEq/L (ref 96–112)
Creatinine, Ser: 0.79 mg/dL (ref 0.50–1.10)

## 2011-04-29 LAB — PROTIME-INR: Prothrombin Time: 28.4 seconds — ABNORMAL HIGH (ref 11.6–15.2)

## 2011-05-02 NOTE — Progress Notes (Signed)
  NAMEDOSHA, BROSHEARS NO.:  [id number]  MEDICAL RECORD NO.:  [id number]  LOCATION:  [facility]                         FACILITY:  [facility]  PHYSICIAN:  Pamella Pert, MD DATE OF BIRTH:  1928-06-05                                PROGRESS NOTE   PROCEDURE PERFORMED: Synchronized direct current cardioversion.  INDICATIONS: Recurrent asymptomatic atrial fibrillation in spite of being on antiarrhythmic therapy.  BRIEF HISTORY: [name] is an 77 year old patient who has recurrent paroxysmal atrial fibrillation.  She is extremely symptomatic with atrial fibrillation in the form of fatigue.  She had underwent successful cardioversion just about a week ago.  Called our office yesterday, complaining about fatigue and new onset of atrial fibrillation, and EKG confirmed shows back in atrial fibrillation.  [facility].  TECHNIQUE: Using 40 mg of propofol to achieve conscious sedation, 100 joules was synchronized direct current cardioversion was performed by delivery of 100 joules of electricity to the patient.  The patient converted from atrial fibrillation to sinus bradycardia.  The patient tolerated procedure well.  No immediate complications were noted.     Pamella Pert, MD     JRG/MEDQ  D:  04/29/2011  T:  04/29/2011  Job:  [id number]  Electronically Signed by Yates Decamp MD on 05/02/2011 02:00:15 PM

## 2011-05-25 ENCOUNTER — Ambulatory Visit (HOSPITAL_COMMUNITY)
Admission: RE | Admit: 2011-05-25 | Discharge: 2011-05-25 | Disposition: A | Discharge status: Home / Self Care | Payer: Medicare Other | Source: Ambulatory Visit | Attending: Cardiology | Admitting: Cardiology

## 2011-05-25 DIAGNOSIS — I4891 Unspecified atrial fibrillation: Secondary | ICD-10-CM | POA: Insufficient documentation

## 2011-05-25 DIAGNOSIS — R5381 Other malaise: Secondary | ICD-10-CM | POA: Insufficient documentation

## 2011-05-25 DIAGNOSIS — Z7901 Long term (current) use of anticoagulants: Secondary | ICD-10-CM | POA: Insufficient documentation

## 2011-05-25 LAB — BASIC METABOLIC PANEL
BUN: 26 mg/dL — ABNORMAL HIGH (ref 6–23)
CO2: 27 mEq/L (ref 19–32)
Chloride: 106 mEq/L (ref 96–112)
Creatinine, Ser: 0.81 mg/dL (ref 0.50–1.10)
Glucose, Bld: 86 mg/dL (ref 70–99)

## 2011-05-25 LAB — CBC
HCT: 46.5 % — ABNORMAL HIGH (ref 36.0–46.0)
MCHC: 34 g/dL (ref 30.0–36.0)
MCV: 89.3 fL (ref 78.0–100.0)
RDW: 14.4 % (ref 11.5–15.5)

## 2011-05-27 NOTE — Progress Notes (Signed)
  [name], [name] NO.:  [id number]  MEDICAL RECORD NO.:  [id number]  LOCATION:  [facility]                         FACILITY:  [facility]  PHYSICIAN:  Pamella Pert, MD DATE OF BIRTH:  [birth year]                                PROGRESS NOTE   PROCEDURE PERFORMED: Synchronized direct current cardioversion using propofol for achieving conscious sedation.  INDICATION: [name] is an 77 year old very pleasant female with recurrent atrial fibrillation, paroxysmal atrial fibrillation.  She is extremely symptomatic with atrial fibrillation.  She has had unfortunately third cardioversion within the last couple months.  She came into my office today complaining of fatigue and recurrence of atrial fibrillation.  She was confirmed to have atrial fibrillation and she is now scheduled for elective cardioversion this evening.  The patient is on chronic Coumadin therapy and her INR has been therapeutic.  TECHNIQUE: After obtaining informed consent, discussing risks, benefits, and alternatives to cardioversion, 100 joules of synchronized direct current was delivered to the patient with successful cardioversion from atrial fibrillation to sinus rhythm.  A 90 mg of propofol was utilized for achieving conscious sedation by Anesthesia.  The patient tolerated procedure well.  No immediate complications.  RECOMMENDATIONS: The patient will be discharged home on the home medications.  Her sotalol has been increased to 120 mg in the morning and 180 mg in the evening.  The evening dose has been increased from 120-180.     Pamella Pert, MD     JRG/MEDQ  D:  05/25/2011  T:  05/26/2011  Job:  [id number]  Electronically Signed by Yates Decamp MD on 05/27/2011 07:06:49 PM

## 2011-06-30 ENCOUNTER — Ambulatory Visit (HOSPITAL_COMMUNITY)
Admission: RE | Admit: 2011-06-30 | Discharge: 2011-06-30 | Disposition: A | Discharge status: Home / Self Care | Payer: Medicare Other | Source: Ambulatory Visit | Attending: Cardiology | Admitting: Cardiology

## 2011-06-30 DIAGNOSIS — R9431 Abnormal electrocardiogram [ECG] [EKG]: Secondary | ICD-10-CM | POA: Insufficient documentation

## 2011-07-13 LAB — DIFFERENTIAL
Basophils Relative: 0
Eosinophils Absolute: 0.2
Monocytes Relative: 11
Neutrophils Relative %: 52

## 2011-07-13 LAB — TSH: TSH: 1.951

## 2011-07-13 LAB — COMPREHENSIVE METABOLIC PANEL
ALT: 21
Alkaline Phosphatase: 59
CO2: 31
Chloride: 103
Glucose, Bld: 80
Potassium: 4.2
Sodium: 140
Total Bilirubin: 0.9
Total Protein: 6.2

## 2011-07-13 LAB — CBC
HCT: 42.3
Hemoglobin: 14.2
MCHC: 33.6
MCV: 92.4
RBC: 4.58

## 2011-07-13 LAB — PROTIME-INR: Prothrombin Time: 26.5 — ABNORMAL HIGH

## 2011-07-20 LAB — APTT: aPTT: 34

## 2011-07-20 LAB — PROTIME-INR: Prothrombin Time: 28.5 — ABNORMAL HIGH

## 2011-07-30 DIAGNOSIS — M4800 Spinal stenosis, site unspecified: Secondary | ICD-10-CM | POA: Insufficient documentation

## 2011-09-29 DIAGNOSIS — T50995A Adverse effect of other drugs, medicaments and biological substances, initial encounter: Secondary | ICD-10-CM | POA: Insufficient documentation

## 2011-09-30 DIAGNOSIS — K219 Gastro-esophageal reflux disease without esophagitis: Secondary | ICD-10-CM | POA: Insufficient documentation

## 2011-10-26 ENCOUNTER — Encounter (HOSPITAL_COMMUNITY): Payer: Self-pay | Admitting: Pharmacy Technician

## 2011-10-27 ENCOUNTER — Other Ambulatory Visit (HOSPITAL_COMMUNITY): Payer: Self-pay | Admitting: Cardiology

## 2011-10-27 DIAGNOSIS — I4891 Unspecified atrial fibrillation: Secondary | ICD-10-CM

## 2011-10-28 ENCOUNTER — Ambulatory Visit (HOSPITAL_COMMUNITY)
Admission: RE | Admit: 2011-10-28 | Discharge: 2011-10-28 | Disposition: A | Discharge status: Home / Self Care | Payer: Medicare Other | Source: Ambulatory Visit | Attending: Cardiology | Admitting: Cardiology

## 2011-10-28 ENCOUNTER — Other Ambulatory Visit: Payer: Self-pay

## 2011-10-28 ENCOUNTER — Encounter (HOSPITAL_COMMUNITY)
Admission: RE | Disposition: A | Discharge status: Home / Self Care | Payer: Self-pay | Source: Ambulatory Visit | Attending: Cardiology

## 2011-10-28 DIAGNOSIS — Z01810 Encounter for preprocedural cardiovascular examination: Secondary | ICD-10-CM | POA: Insufficient documentation

## 2011-10-28 SURGERY — CARDIOVERSION
Anesthesia: Monitor Anesthesia Care

## 2011-10-28 NOTE — H&P (Signed)
  Please see paper chart  

## 2011-10-28 NOTE — Progress Notes (Signed)
12 Lead EKG appears to be NSR, Ekg faxed to Dr. Jacinto Halim who instructed for pt to be discharged and f/u in his office in 2 weeks, pt informed.

## 2012-03-22 DIAGNOSIS — I872 Venous insufficiency (chronic) (peripheral): Secondary | ICD-10-CM | POA: Insufficient documentation

## 2012-03-29 ENCOUNTER — Encounter (HOSPITAL_BASED_OUTPATIENT_CLINIC_OR_DEPARTMENT_OTHER): Payer: Medicare Other | Attending: General Surgery

## 2012-03-29 DIAGNOSIS — S81009A Unspecified open wound, unspecified knee, initial encounter: Secondary | ICD-10-CM | POA: Insufficient documentation

## 2012-03-29 DIAGNOSIS — S0180XA Unspecified open wound of other part of head, initial encounter: Secondary | ICD-10-CM | POA: Insufficient documentation

## 2012-03-29 DIAGNOSIS — W19XXXA Unspecified fall, initial encounter: Secondary | ICD-10-CM | POA: Insufficient documentation

## 2012-03-29 DIAGNOSIS — I4891 Unspecified atrial fibrillation: Secondary | ICD-10-CM | POA: Insufficient documentation

## 2012-03-29 DIAGNOSIS — E785 Hyperlipidemia, unspecified: Secondary | ICD-10-CM | POA: Insufficient documentation

## 2012-03-29 DIAGNOSIS — IMO0002 Reserved for concepts with insufficient information to code with codable children: Secondary | ICD-10-CM | POA: Insufficient documentation

## 2012-03-29 DIAGNOSIS — I10 Essential (primary) hypertension: Secondary | ICD-10-CM | POA: Insufficient documentation

## 2012-03-29 DIAGNOSIS — Z7901 Long term (current) use of anticoagulants: Secondary | ICD-10-CM | POA: Insufficient documentation

## 2012-03-29 DIAGNOSIS — M810 Age-related osteoporosis without current pathological fracture: Secondary | ICD-10-CM | POA: Insufficient documentation

## 2012-03-29 NOTE — H&P (Signed)
[name], [name] NO.:  [id number]  MEDICAL RECORD NO.:  [id number]  LOCATION:  [facility]                         FACILITY:  [facility]  PHYSICIAN:  Joanne Gavel, M.D.        DATE OF BIRTH:  1928-03-30  DATE OF ADMISSION:  03/29/2012 DATE OF DISCHARGE:                             HISTORY & PHYSICAL   CHIEF COMPLAINT:  Wounds of right leg.  HISTORY OF PRESENT ILLNESS:  This patient fell approximately 2 weeks ago and tore some skin from the anterior surface of the leg.  She also has extensive bruising on the right side of her body.  They have been treating the wound with triple antibiotic ointment.  PAST MEDICAL HISTORY:  Significant for, 1. Hyperlipidemia. 2. Osteoporosis. 3. Migraine headache. 4. Mild renal insufficiency. 5. Atrial fibrillation. 6. Skin cancer.  PAST SURGICAL HISTORY: 1. Several colonoscopies. 2. Cataract surgery. 3. Bunion surgery. 4. Vein stripping. 5. Facial skin cancer removal. 6. Tonsillectomy. 7. Adenoidectomy.  CIGARETTES:  None for many years.  ALCOHOL:  Occasional wine.  MEDICATIONS:  Protonix, sotalol, Effexor, Coumadin, Bystolic, Allegra, vitamin D, Actonel, pravastatin, doxycycline, tramadol, acidophilus, and Imodium.  ALLERGIES:  LATEX, SULFA, FLUORIDE, IVP DYE, and AMIODARONE.  REVIEW OF SYSTEMS:  As above.  PHYSICAL EXAMINATION:  VITAL SIGNS:  Temperature 98.4, pulse 66 and regular, respirations 18, blood pressure 120/80. GENERAL:  Well developed, well nourished with some bruising on the right cheek. CHEST:  Clear. HEART:  Regular rhythm. ABDOMEN:  Not examined. EXTREMITIES:  Examination of the right lower extremity reveals good peripheral pulses, many spider veins, bruising on the anterior surface of the leg.  There is a 3.4 x 1.3 very superficial wound with the skin torn off and a small wound 0.6 x 0.2 also very superficial.  IMPRESSION:  Posttraumatic wound, right leg.  PLAN OF TREATMENT:  Start with Santyl  and Profore Lite.  When the wound cleans up little bit, I believe we will be able to switch to collagen.     Joanne Gavel, M.D.     RA/MEDQ  D:  03/29/2012  T:  03/29/2012  Job:  [id number]

## 2012-04-12 ENCOUNTER — Encounter (HOSPITAL_BASED_OUTPATIENT_CLINIC_OR_DEPARTMENT_OTHER): Payer: Medicare Other | Attending: General Surgery

## 2012-04-12 DIAGNOSIS — E785 Hyperlipidemia, unspecified: Secondary | ICD-10-CM | POA: Insufficient documentation

## 2012-04-12 DIAGNOSIS — X58XXXA Exposure to other specified factors, initial encounter: Secondary | ICD-10-CM | POA: Insufficient documentation

## 2012-04-12 DIAGNOSIS — S81009A Unspecified open wound, unspecified knee, initial encounter: Secondary | ICD-10-CM | POA: Insufficient documentation

## 2012-04-12 DIAGNOSIS — M810 Age-related osteoporosis without current pathological fracture: Secondary | ICD-10-CM | POA: Insufficient documentation

## 2012-04-12 DIAGNOSIS — Z79899 Other long term (current) drug therapy: Secondary | ICD-10-CM | POA: Insufficient documentation

## 2012-04-12 DIAGNOSIS — I4891 Unspecified atrial fibrillation: Secondary | ICD-10-CM | POA: Insufficient documentation

## 2012-04-12 DIAGNOSIS — S81809A Unspecified open wound, unspecified lower leg, initial encounter: Secondary | ICD-10-CM | POA: Insufficient documentation

## 2012-04-12 DIAGNOSIS — Z7901 Long term (current) use of anticoagulants: Secondary | ICD-10-CM | POA: Insufficient documentation

## 2012-04-26 ENCOUNTER — Encounter (HOSPITAL_BASED_OUTPATIENT_CLINIC_OR_DEPARTMENT_OTHER): Payer: Medicare Other

## 2012-04-26 NOTE — Progress Notes (Signed)
[facility]  NAMELAKAISHA, DANISH NO.:  [id number]  MEDICAL RECORD NO.:  [id number]      DATE OF BIRTH:  1927-10-14  PHYSICIAN:  Ardath Sax, M.D.           VISIT DATE:                                  OFFICE VISIT   She is an 78 year old lady who suffered trauma to the anterior aspect of her right leg.  She is a diabetic.  She has had a heart surgery before and is presently on Coumadin.  She was seen in the wound clinic for the first time on June 18 because of this trauma to her anterior aspect of her right leg.  We cleaned the wound and started her on collagen dressings every day and they healed up this wound very nicely.  She is able to go back to her doctor to follow up on this and send her back here p.r.n. but for in the present it is well healed with no problems. Today, all her vital signs were normal including her temperature.  She had a blood pressure of 120/80.  We cleared this up with collagen dressings and everything is totally healed.     Ardath Sax, M.D.     PP/MEDQ  D:  04/26/2012  T:  04/26/2012  Job:  [id number]

## 2012-10-17 ENCOUNTER — Ambulatory Visit
Admission: RE | Admit: 2012-10-17 | Discharge: 2012-10-17 | Disposition: A | Discharge status: Home / Self Care | Payer: Medicare Other | Source: Ambulatory Visit | Attending: Orthopedic Surgery | Admitting: Orthopedic Surgery

## 2012-10-17 ENCOUNTER — Other Ambulatory Visit: Payer: Self-pay | Admitting: Orthopedic Surgery

## 2012-10-17 DIAGNOSIS — M79642 Pain in left hand: Secondary | ICD-10-CM

## 2013-08-16 ENCOUNTER — Ambulatory Visit: Payer: Self-pay | Admitting: Podiatry

## 2013-09-18 DIAGNOSIS — L03116 Cellulitis of left lower limb: Secondary | ICD-10-CM | POA: Insufficient documentation

## 2013-09-26 ENCOUNTER — Encounter (HOSPITAL_BASED_OUTPATIENT_CLINIC_OR_DEPARTMENT_OTHER): Payer: Medicare Other | Attending: General Surgery

## 2013-09-26 DIAGNOSIS — W19XXXA Unspecified fall, initial encounter: Secondary | ICD-10-CM | POA: Insufficient documentation

## 2013-09-26 DIAGNOSIS — S8010XA Contusion of unspecified lower leg, initial encounter: Secondary | ICD-10-CM | POA: Insufficient documentation

## 2013-09-26 DIAGNOSIS — E785 Hyperlipidemia, unspecified: Secondary | ICD-10-CM | POA: Insufficient documentation

## 2013-09-26 DIAGNOSIS — Z7901 Long term (current) use of anticoagulants: Secondary | ICD-10-CM | POA: Insufficient documentation

## 2013-09-26 DIAGNOSIS — K219 Gastro-esophageal reflux disease without esophagitis: Secondary | ICD-10-CM | POA: Insufficient documentation

## 2013-09-26 DIAGNOSIS — I4891 Unspecified atrial fibrillation: Secondary | ICD-10-CM | POA: Insufficient documentation

## 2013-09-26 DIAGNOSIS — Z79899 Other long term (current) drug therapy: Secondary | ICD-10-CM | POA: Insufficient documentation

## 2013-09-26 DIAGNOSIS — I739 Peripheral vascular disease, unspecified: Secondary | ICD-10-CM | POA: Insufficient documentation

## 2013-09-27 NOTE — H&P (Signed)
[name], SAMSON NO.:  [id number]  MEDICAL RECORD NO.:  [id number]  LOCATION:  [facility]                         FACILITY:  [facility]  PHYSICIAN:  Joanne Gavel, M.D.        DATE OF BIRTH:  04-30-1928  DATE OF ADMISSION:  09/26/2013 DATE OF DISCHARGE:                             HISTORY & PHYSICAL   CHIEF COMPLAINT:  Wound, left leg.  HISTORY OF PRESENT ILLNESS:  This patient fell several weeks ago, developed a sizable hematoma which has been throbbing.  She has been placed on doxycycline, but has had essentially no wound care.  PAST MEDICAL HISTORY:  Significant for GERD, hypercholesterolemia, atrial fibrillation treated by cardiac ablation, peripheral vascular disease, arthritis, and sinusitis.  PAST SURGICAL HISTORY:  Cardio ablation, vein stripping, and bunionectomy.  SOCIAL HISTORY:  Cigarettes none.  Alcohol none.  MEDICATIONS:  Coumadin, sotalol, venlafaxine, Actonel, pravastatin, Protonix, Allegra, doxycycline, and Tylenol.  ALLERGIES:  Contrast media, latex, sulfa antibiotics, and __________.  REVIEW OF SYSTEMS:  As above.  PHYSICAL EXAMINATION:  VITAL SIGNS:  Temperature 98.3, pulse 64, respirations 17, blood pressure 123/83. GENERAL APPEARANCE:  Well developed, well nourished, no distress. CHEST:  Clear. HEART:  Regular rhythm. EXTREMITIES:  The lower extremity reveals a large hematoma 3.5 x 3.6. After the hematoma was evacuated, there were areas of overhang circumferentially around a sizable skin defect.  There is no sign of infection, although there is a little congestion around this wound.  ABI was not measured.  Peripheral pulses are weakly palpable.  IMPRESSION:  Trauma with a subcutaneous hematoma and full-thickness skin loss.  PLAN OF TREATMENT:  We will start with silver alginate packing since the skin opening is so large, we may be able to apply a VAC.  We will see her in 7 days.     Joanne Gavel, M.D.     RA/MEDQ  D:   09/26/2013  T:  09/27/2013  Job:  [id number]

## 2013-10-17 ENCOUNTER — Encounter (HOSPITAL_BASED_OUTPATIENT_CLINIC_OR_DEPARTMENT_OTHER): Payer: Medicare Other | Attending: General Surgery

## 2013-10-17 DIAGNOSIS — L97909 Non-pressure chronic ulcer of unspecified part of unspecified lower leg with unspecified severity: Principal | ICD-10-CM

## 2013-10-17 DIAGNOSIS — I87339 Chronic venous hypertension (idiopathic) with ulcer and inflammation of unspecified lower extremity: Secondary | ICD-10-CM | POA: Insufficient documentation

## 2013-11-14 ENCOUNTER — Encounter (HOSPITAL_BASED_OUTPATIENT_CLINIC_OR_DEPARTMENT_OTHER): Payer: Medicare Other | Attending: General Surgery

## 2013-11-14 DIAGNOSIS — I87339 Chronic venous hypertension (idiopathic) with ulcer and inflammation of unspecified lower extremity: Secondary | ICD-10-CM | POA: Insufficient documentation

## 2013-11-14 DIAGNOSIS — L97909 Non-pressure chronic ulcer of unspecified part of unspecified lower leg with unspecified severity: Principal | ICD-10-CM

## 2014-01-08 ENCOUNTER — Encounter: Payer: Self-pay | Admitting: Podiatry

## 2014-01-08 ENCOUNTER — Ambulatory Visit (INDEPENDENT_AMBULATORY_CARE_PROVIDER_SITE_OTHER): Payer: Medicare Other | Admitting: Podiatry

## 2014-01-08 VITALS — BP 145/87 | HR 72 | Resp 16 | Ht 68.0 in | Wt 165.0 lb

## 2014-01-08 DIAGNOSIS — B351 Tinea unguium: Secondary | ICD-10-CM

## 2014-01-08 DIAGNOSIS — M79609 Pain in unspecified limb: Secondary | ICD-10-CM

## 2014-01-09 NOTE — Progress Notes (Signed)
Patient ID: [name], female   DOB: Oct 01, 1928, 80 y.o.   MRN: [id number]  Subjective: This patient presents for debridement of symptomatic mycotic toenails. The last visit for this service was 05/24/2013.  Objective: Elongated, discolored, hypertrophic, incurvated, toenails x10 noted  Assessment: Neglected symptomatic onychomycoses x10  Plan: Nails x10 are debrided without any bleeding. Reappoint at three-month intervals.

## 2014-04-07 ENCOUNTER — Inpatient Hospital Stay (HOSPITAL_COMMUNITY)
Admission: EM | Admit: 2014-04-07 | Discharge: 2014-04-11 | DRG: 469 | Disposition: A | Discharge status: Skilled Nursing Facility | Payer: Medicare Other | Attending: Internal Medicine | Admitting: Internal Medicine

## 2014-04-07 ENCOUNTER — Encounter (HOSPITAL_COMMUNITY): Payer: Self-pay | Admitting: Emergency Medicine

## 2014-04-07 DIAGNOSIS — J690 Pneumonitis due to inhalation of food and vomit: Secondary | ICD-10-CM

## 2014-04-07 DIAGNOSIS — D62 Acute posthemorrhagic anemia: Secondary | ICD-10-CM | POA: Diagnosis not present

## 2014-04-07 DIAGNOSIS — S72009A Fracture of unspecified part of neck of unspecified femur, initial encounter for closed fracture: Principal | ICD-10-CM

## 2014-04-07 DIAGNOSIS — E785 Hyperlipidemia, unspecified: Secondary | ICD-10-CM | POA: Diagnosis present

## 2014-04-07 DIAGNOSIS — I482 Chronic atrial fibrillation, unspecified: Secondary | ICD-10-CM

## 2014-04-07 DIAGNOSIS — W19XXXA Unspecified fall, initial encounter: Secondary | ICD-10-CM

## 2014-04-07 DIAGNOSIS — M810 Age-related osteoporosis without current pathological fracture: Secondary | ICD-10-CM | POA: Diagnosis present

## 2014-04-07 DIAGNOSIS — I4891 Unspecified atrial fibrillation: Secondary | ICD-10-CM | POA: Diagnosis present

## 2014-04-07 DIAGNOSIS — Z79899 Other long term (current) drug therapy: Secondary | ICD-10-CM

## 2014-04-07 DIAGNOSIS — W010XXA Fall on same level from slipping, tripping and stumbling without subsequent striking against object, initial encounter: Secondary | ICD-10-CM | POA: Diagnosis present

## 2014-04-07 DIAGNOSIS — E669 Obesity, unspecified: Secondary | ICD-10-CM

## 2014-04-07 DIAGNOSIS — Z87891 Personal history of nicotine dependence: Secondary | ICD-10-CM

## 2014-04-07 DIAGNOSIS — I10 Essential (primary) hypertension: Secondary | ICD-10-CM | POA: Diagnosis present

## 2014-04-07 DIAGNOSIS — S72001P Fracture of unspecified part of neck of right femur, subsequent encounter for closed fracture with malunion: Secondary | ICD-10-CM

## 2014-04-07 DIAGNOSIS — Z7901 Long term (current) use of anticoagulants: Secondary | ICD-10-CM

## 2014-04-07 DIAGNOSIS — S72001A Fracture of unspecified part of neck of right femur, initial encounter for closed fracture: Secondary | ICD-10-CM

## 2014-04-07 HISTORY — DX: Peripheral vascular disease, unspecified: I73.9

## 2014-04-07 HISTORY — DX: Other specified postprocedural states: Z98.890

## 2014-04-07 HISTORY — DX: Pneumonia, unspecified organism: J18.9

## 2014-04-07 HISTORY — DX: Headache: R51

## 2014-04-07 HISTORY — DX: Other specified postprocedural states: R11.2

## 2014-04-07 HISTORY — DX: Unspecified atrial fibrillation: I48.91

## 2014-04-07 HISTORY — DX: Cardiac arrhythmia, unspecified: I49.9

## 2014-04-07 HISTORY — DX: Gastro-esophageal reflux disease without esophagitis: K21.9

## 2014-04-07 NOTE — ED Notes (Addendum)
Per EMS- pt was on newly polished hard wood floors, fell and landed on right hip. Shortening and rotation noted. Good pedal pulse. 9/10 pain. Denies LOC/head/neck pain. Given 100 Fentanyl to 20 G L Hand PIV.

## 2014-04-08 ENCOUNTER — Emergency Department (HOSPITAL_COMMUNITY): Payer: Medicare Other

## 2014-04-08 ENCOUNTER — Inpatient Hospital Stay (HOSPITAL_COMMUNITY): Payer: Medicare Other | Admitting: Anesthesiology

## 2014-04-08 ENCOUNTER — Encounter (HOSPITAL_COMMUNITY): Payer: Self-pay | Admitting: Internal Medicine

## 2014-04-08 ENCOUNTER — Encounter (HOSPITAL_COMMUNITY)
Admission: EM | Disposition: A | Discharge status: Skilled Nursing Facility | Payer: Self-pay | Source: Home / Self Care | Attending: Internal Medicine

## 2014-04-08 ENCOUNTER — Encounter (HOSPITAL_COMMUNITY): Payer: Medicare Other | Admitting: Anesthesiology

## 2014-04-08 ENCOUNTER — Inpatient Hospital Stay (HOSPITAL_COMMUNITY): Payer: Medicare Other

## 2014-04-08 DIAGNOSIS — I4891 Unspecified atrial fibrillation: Secondary | ICD-10-CM | POA: Diagnosis present

## 2014-04-08 DIAGNOSIS — I10 Essential (primary) hypertension: Secondary | ICD-10-CM

## 2014-04-08 DIAGNOSIS — M810 Age-related osteoporosis without current pathological fracture: Secondary | ICD-10-CM | POA: Diagnosis present

## 2014-04-08 DIAGNOSIS — S72001A Fracture of unspecified part of neck of right femur, initial encounter for closed fracture: Secondary | ICD-10-CM

## 2014-04-08 DIAGNOSIS — E785 Hyperlipidemia, unspecified: Secondary | ICD-10-CM

## 2014-04-08 DIAGNOSIS — S72009A Fracture of unspecified part of neck of unspecified femur, initial encounter for closed fracture: Principal | ICD-10-CM

## 2014-04-08 HISTORY — PX: HEMIARTHROPLASTY HIP: SUR652

## 2014-04-08 HISTORY — PX: HIP ARTHROPLASTY: SHX981

## 2014-04-08 LAB — URINALYSIS, ROUTINE W REFLEX MICROSCOPIC
Bilirubin Urine: NEGATIVE
Glucose, UA: NEGATIVE mg/dL
KETONES UR: NEGATIVE mg/dL
Leukocytes, UA: NEGATIVE
NITRITE: NEGATIVE
Protein, ur: NEGATIVE mg/dL
Specific Gravity, Urine: 1.027 (ref 1.005–1.030)
Urobilinogen, UA: 0.2 mg/dL (ref 0.0–1.0)
pH: 5 (ref 5.0–8.0)

## 2014-04-08 LAB — PROTIME-INR
INR: 1.79 — AB (ref 0.00–1.49)
Prothrombin Time: 20.8 seconds — ABNORMAL HIGH (ref 11.6–15.2)

## 2014-04-08 LAB — TYPE AND SCREEN
ABO/RH(D): O POS
ANTIBODY SCREEN: NEGATIVE

## 2014-04-08 LAB — CBC WITH DIFFERENTIAL/PLATELET
Basophils Absolute: 0 10*3/uL (ref 0.0–0.1)
Basophils Relative: 0 % (ref 0–1)
Eosinophils Absolute: 0.1 10*3/uL (ref 0.0–0.7)
Eosinophils Relative: 1 % (ref 0–5)
HCT: 42.4 % (ref 36.0–46.0)
HEMOGLOBIN: 13.5 g/dL (ref 12.0–15.0)
LYMPHS ABS: 1.2 10*3/uL (ref 0.7–4.0)
LYMPHS PCT: 16 % (ref 12–46)
MCH: 27.7 pg (ref 26.0–34.0)
MCHC: 31.8 g/dL (ref 30.0–36.0)
MCV: 86.9 fL (ref 78.0–100.0)
MONOS PCT: 6 % (ref 3–12)
Monocytes Absolute: 0.4 10*3/uL (ref 0.1–1.0)
NEUTROS PCT: 77 % (ref 43–77)
Neutro Abs: 5.7 10*3/uL (ref 1.7–7.7)
PLATELETS: 149 10*3/uL — AB (ref 150–400)
RBC: 4.88 MIL/uL (ref 3.87–5.11)
RDW: 14.9 % (ref 11.5–15.5)
WBC: 7.4 10*3/uL (ref 4.0–10.5)

## 2014-04-08 LAB — BASIC METABOLIC PANEL
BUN: 25 mg/dL — ABNORMAL HIGH (ref 6–23)
CO2: 27 meq/L (ref 19–32)
Calcium: 9.2 mg/dL (ref 8.4–10.5)
Chloride: 103 mEq/L (ref 96–112)
Creatinine, Ser: 0.77 mg/dL (ref 0.50–1.10)
GFR calc Af Amer: 87 mL/min — ABNORMAL LOW (ref >90)
GFR calc non Af Amer: 75 mL/min — ABNORMAL LOW (ref >90)
GLUCOSE: 115 mg/dL — AB (ref 70–99)
POTASSIUM: 4.3 meq/L (ref 3.7–5.3)
SODIUM: 143 meq/L (ref 137–147)

## 2014-04-08 LAB — ABO/RH: ABO/RH(D): O POS

## 2014-04-08 LAB — URINE MICROSCOPIC-ADD ON

## 2014-04-08 LAB — MRSA PCR SCREENING: MRSA by PCR: NEGATIVE

## 2014-04-08 LAB — APTT: APTT: 27 s (ref 24–37)

## 2014-04-08 SURGERY — HEMIARTHROPLASTY, HIP, DIRECT ANTERIOR APPROACH, FOR FRACTURE
Anesthesia: General | Laterality: Right

## 2014-04-08 MED ORDER — OXYCODONE HCL 5 MG PO TABS
5.0000 mg | ORAL_TABLET | ORAL | Status: DC | PRN
Start: 2014-04-08 — End: 2014-04-09
  Administered 2014-04-08: 10 mg via ORAL
  Administered 2014-04-09: 5 mg via ORAL
  Administered 2014-04-09: 10 mg via ORAL
  Filled 2014-04-08: qty 1
  Filled 2014-04-08: qty 2

## 2014-04-08 MED ORDER — OXYCODONE HCL 5 MG PO TABS
ORAL_TABLET | ORAL | Status: AC
  Administered 2014-04-08: 10 mg via ORAL
  Filled 2014-04-08: qty 2

## 2014-04-08 MED ORDER — MIDAZOLAM HCL 2 MG/2ML IJ SOLN
INTRAMUSCULAR | Status: AC
  Filled 2014-04-08: qty 2

## 2014-04-08 MED ORDER — MIDAZOLAM HCL 2 MG/2ML IJ SOLN
INTRAMUSCULAR | Status: DC | PRN
  Administered 2014-04-08: 2 mg via INTRAVENOUS

## 2014-04-08 MED ORDER — NEOSTIGMINE METHYLSULFATE 10 MG/10ML IV SOLN
INTRAVENOUS | Status: DC | PRN
  Administered 2014-04-08: 2 mg via INTRAVENOUS

## 2014-04-08 MED ORDER — ROCURONIUM BROMIDE 50 MG/5ML IV SOLN
INTRAVENOUS | Status: AC
  Filled 2014-04-08: qty 1

## 2014-04-08 MED ORDER — ALUM & MAG HYDROXIDE-SIMETH 200-200-20 MG/5ML PO SUSP
30.0000 mL | ORAL | Status: DC | PRN
Start: 2014-04-08 — End: 2014-04-11

## 2014-04-08 MED ORDER — MORPHINE SULFATE 2 MG/ML IJ SOLN
0.5000 mg | INTRAMUSCULAR | Status: DC | PRN
  Filled 2014-04-08: qty 1

## 2014-04-08 MED ORDER — WARFARIN - PHARMACIST DOSING INPATIENT: Freq: Every day | Status: DC

## 2014-04-08 MED ORDER — VENLAFAXINE HCL 75 MG PO TABS
75.0000 mg | ORAL_TABLET | Freq: Every day | ORAL | Status: DC
  Administered 2014-04-08 – 2014-04-11 (×4): 75 mg via ORAL
  Filled 2014-04-08 (×4): qty 1

## 2014-04-08 MED ORDER — GLYCOPYRROLATE 0.2 MG/ML IJ SOLN
INTRAMUSCULAR | Status: DC | PRN
  Administered 2014-04-08: 0.4 mg via INTRAVENOUS

## 2014-04-08 MED ORDER — ONDANSETRON HCL 4 MG/2ML IJ SOLN
INTRAMUSCULAR | Status: AC
  Filled 2014-04-08: qty 2

## 2014-04-08 MED ORDER — METOCLOPRAMIDE HCL 5 MG/ML IJ SOLN
5.0000 mg | Freq: Three times a day (TID) | INTRAMUSCULAR | Status: DC | PRN
  Administered 2014-04-09: 10 mg via INTRAVENOUS
  Filled 2014-04-08 (×2): qty 2

## 2014-04-08 MED ORDER — ACETAMINOPHEN 325 MG PO TABS: 650.0000 mg | ORAL_TABLET | Freq: Four times a day (QID) | ORAL | Status: DC | PRN

## 2014-04-08 MED ORDER — HYDROCODONE-ACETAMINOPHEN 5-325 MG PO TABS
1.0000 | ORAL_TABLET | Freq: Four times a day (QID) | ORAL | Status: DC | PRN
  Administered 2014-04-08: 2 via ORAL
  Filled 2014-04-08: qty 2

## 2014-04-08 MED ORDER — ONDANSETRON HCL 4 MG/2ML IJ SOLN
4.0000 mg | Freq: Four times a day (QID) | INTRAMUSCULAR | Status: DC | PRN
  Administered 2014-04-08: 4 mg via INTRAVENOUS
  Filled 2014-04-08 (×2): qty 2

## 2014-04-08 MED ORDER — SODIUM CHLORIDE 0.9 % IR SOLN
Status: DC | PRN
  Administered 2014-04-08: 1000 mL

## 2014-04-08 MED ORDER — BISACODYL 10 MG RE SUPP
10.0000 mg | Freq: Every day | RECTAL | Status: DC | PRN
  Administered 2014-04-10: 10 mg via RECTAL
  Filled 2014-04-08: qty 1

## 2014-04-08 MED ORDER — METHOCARBAMOL 1000 MG/10ML IJ SOLN
500.0000 mg | Freq: Four times a day (QID) | INTRAMUSCULAR | Status: DC | PRN
  Filled 2014-04-08: qty 5

## 2014-04-08 MED ORDER — FENTANYL CITRATE 0.05 MG/ML IJ SOLN
INTRAMUSCULAR | Status: AC
  Filled 2014-04-08: qty 5

## 2014-04-08 MED ORDER — METHOCARBAMOL 500 MG PO TABS
ORAL_TABLET | ORAL | Status: AC
  Administered 2014-04-08: 500 mg via ORAL
  Filled 2014-04-08: qty 1

## 2014-04-08 MED ORDER — ONDANSETRON HCL 4 MG/2ML IJ SOLN
4.0000 mg | Freq: Once | INTRAMUSCULAR | Status: AC
  Administered 2014-04-08: 4 mg via INTRAVENOUS
  Filled 2014-04-08: qty 2

## 2014-04-08 MED ORDER — SIMVASTATIN 20 MG PO TABS: 20.0000 mg | ORAL_TABLET | Freq: Every day | ORAL | Status: DC

## 2014-04-08 MED ORDER — HYDROMORPHONE HCL PF 1 MG/ML IJ SOLN
0.5000 mg | INTRAMUSCULAR | Status: DC | PRN
  Administered 2014-04-08 (×4): 0.5 mg via INTRAVENOUS
  Filled 2014-04-08 (×4): qty 1

## 2014-04-08 MED ORDER — VITAMIN D (ERGOCALCIFEROL) 1.25 MG (50000 UNIT) PO CAPS
50000.0000 [IU] | ORAL_CAPSULE | ORAL | Status: DC
  Administered 2014-04-11: 50000 [IU] via ORAL
  Filled 2014-04-08: qty 1

## 2014-04-08 MED ORDER — ACETAMINOPHEN 650 MG RE SUPP: 650.0000 mg | Freq: Four times a day (QID) | RECTAL | Status: DC | PRN

## 2014-04-08 MED ORDER — DOCUSATE SODIUM 100 MG PO CAPS
100.0000 mg | ORAL_CAPSULE | Freq: Two times a day (BID) | ORAL | Status: DC
  Administered 2014-04-08 – 2014-04-11 (×6): 100 mg via ORAL
  Filled 2014-04-08 (×8): qty 1

## 2014-04-08 MED ORDER — HYDROMORPHONE HCL PF 1 MG/ML IJ SOLN
0.5000 mg | Freq: Once | INTRAMUSCULAR | Status: AC
  Administered 2014-04-08: 0.5 mg via INTRAVENOUS
  Filled 2014-04-08: qty 1

## 2014-04-08 MED ORDER — SODIUM CHLORIDE 0.9 % IV SOLN
INTRAVENOUS | Status: DC
  Administered 2014-04-08: 21:00:00 via INTRAVENOUS

## 2014-04-08 MED ORDER — PROPOFOL 10 MG/ML IV BOLUS
INTRAVENOUS | Status: DC | PRN
  Administered 2014-04-08: 50 mg via INTRAVENOUS

## 2014-04-08 MED ORDER — CEFAZOLIN SODIUM-DEXTROSE 2-3 GM-% IV SOLR
2.0000 g | Freq: Four times a day (QID) | INTRAVENOUS | Status: AC
  Administered 2014-04-08 – 2014-04-09 (×2): 2 g via INTRAVENOUS
  Filled 2014-04-08 (×2): qty 50

## 2014-04-08 MED ORDER — METOCLOPRAMIDE HCL 10 MG PO TABS: 5.0000 mg | ORAL_TABLET | Freq: Three times a day (TID) | ORAL | Status: DC | PRN

## 2014-04-08 MED ORDER — EPHEDRINE SULFATE 50 MG/ML IJ SOLN
INTRAMUSCULAR | Status: DC | PRN
  Administered 2014-04-08 (×2): 15 mg via INTRAVENOUS
  Administered 2014-04-08: 20 mg via INTRAVENOUS

## 2014-04-08 MED ORDER — HYDROMORPHONE HCL PF 1 MG/ML IJ SOLN
INTRAMUSCULAR | Status: AC
  Filled 2014-04-08: qty 1

## 2014-04-08 MED ORDER — HYDROCODONE-ACETAMINOPHEN 5-325 MG PO TABS: 1.0000 | ORAL_TABLET | Freq: Four times a day (QID) | ORAL | Status: DC | PRN

## 2014-04-08 MED ORDER — FENTANYL CITRATE 0.05 MG/ML IJ SOLN
25.0000 ug | INTRAMUSCULAR | Status: DC | PRN
  Administered 2014-04-08 (×2): 25 ug via INTRAVENOUS

## 2014-04-08 MED ORDER — HYDROMORPHONE HCL PF 1 MG/ML IJ SOLN: 0.1000 mg | INTRAMUSCULAR | Status: DC | PRN

## 2014-04-08 MED ORDER — POTASSIUM CHLORIDE ER 10 MEQ PO TBCR
10.0000 meq | EXTENDED_RELEASE_TABLET | Freq: Two times a day (BID) | ORAL | Status: DC
  Administered 2014-04-08 – 2014-04-11 (×7): 10 meq via ORAL
  Filled 2014-04-08 (×8): qty 1

## 2014-04-08 MED ORDER — SOTALOL HCL 120 MG PO TABS
120.0000 mg | ORAL_TABLET | Freq: Two times a day (BID) | ORAL | Status: DC
  Administered 2014-04-08 – 2014-04-09 (×3): 120 mg via ORAL
  Filled 2014-04-08 (×4): qty 1

## 2014-04-08 MED ORDER — ROCURONIUM BROMIDE 100 MG/10ML IV SOLN
INTRAVENOUS | Status: DC | PRN
  Administered 2014-04-08: 40 mg via INTRAVENOUS

## 2014-04-08 MED ORDER — LORATADINE 10 MG PO TABS
10.0000 mg | ORAL_TABLET | Freq: Every day | ORAL | Status: DC
  Administered 2014-04-08 – 2014-04-11 (×4): 10 mg via ORAL
  Filled 2014-04-08 (×4): qty 1

## 2014-04-08 MED ORDER — WARFARIN SODIUM 4 MG PO TABS
4.0000 mg | ORAL_TABLET | Freq: Once | ORAL | Status: AC
  Administered 2014-04-08: 4 mg via ORAL
  Filled 2014-04-08: qty 1

## 2014-04-08 MED ORDER — TRAMADOL HCL 50 MG PO TABS: 50.0000 mg | ORAL_TABLET | Freq: Four times a day (QID) | ORAL | Status: DC | PRN

## 2014-04-08 MED ORDER — PRAVASTATIN SODIUM 40 MG PO TABS
40.0000 mg | ORAL_TABLET | Freq: Every day | ORAL | Status: DC
  Administered 2014-04-09 – 2014-04-10 (×2): 40 mg via ORAL
  Filled 2014-04-08 (×4): qty 1

## 2014-04-08 MED ORDER — POLYETHYLENE GLYCOL 3350 17 G PO PACK
17.0000 g | PACK | Freq: Every day | ORAL | Status: DC | PRN
  Filled 2014-04-08 (×2): qty 1

## 2014-04-08 MED ORDER — FENTANYL CITRATE 0.05 MG/ML IJ SOLN
INTRAMUSCULAR | Status: DC | PRN
  Administered 2014-04-08 (×2): 50 ug via INTRAVENOUS

## 2014-04-08 MED ORDER — VITAMIN K1 10 MG/ML IJ SOLN
5.0000 mg | Freq: Once | INTRAVENOUS | Status: DC
  Filled 2014-04-08: qty 0.5

## 2014-04-08 MED ORDER — FENTANYL CITRATE 0.05 MG/ML IJ SOLN
INTRAMUSCULAR | Status: AC
  Administered 2014-04-08: 25 ug via INTRAVENOUS
  Filled 2014-04-08: qty 2

## 2014-04-08 MED ORDER — NEOSTIGMINE METHYLSULFATE 10 MG/10ML IV SOLN
INTRAVENOUS | Status: AC
  Filled 2014-04-08: qty 1

## 2014-04-08 MED ORDER — GLYCOPYRROLATE 0.2 MG/ML IJ SOLN
INTRAMUSCULAR | Status: AC
  Filled 2014-04-08: qty 3

## 2014-04-08 MED ORDER — LACTATED RINGERS IV SOLN
INTRAVENOUS | Status: DC | PRN
  Administered 2014-04-08 (×3): via INTRAVENOUS

## 2014-04-08 MED ORDER — SUCRALFATE 1 G PO TABS
1.0000 g | ORAL_TABLET | Freq: Three times a day (TID) | ORAL | Status: DC
  Administered 2014-04-08 – 2014-04-11 (×11): 1 g via ORAL
  Filled 2014-04-08 (×17): qty 1

## 2014-04-08 MED ORDER — LIDOCAINE HCL (CARDIAC) 20 MG/ML IV SOLN
INTRAVENOUS | Status: AC
  Filled 2014-04-08: qty 5

## 2014-04-08 MED ORDER — HYDROMORPHONE HCL PF 1 MG/ML IJ SOLN
1.0000 mg | INTRAMUSCULAR | Status: DC | PRN
  Administered 2014-04-08 – 2014-04-09 (×2): 1 mg via INTRAVENOUS
  Filled 2014-04-08 (×3): qty 1

## 2014-04-08 MED ORDER — MENTHOL 3 MG MT LOZG
1.0000 | LOZENGE | OROMUCOSAL | Status: DC | PRN
  Administered 2014-04-11: 3 mg via ORAL
  Filled 2014-04-08: qty 9

## 2014-04-08 MED ORDER — MORPHINE SULFATE 4 MG/ML IJ SOLN
4.0000 mg | Freq: Once | INTRAMUSCULAR | Status: AC
  Administered 2014-04-08: 4 mg via INTRAVENOUS
  Filled 2014-04-08: qty 1

## 2014-04-08 MED ORDER — ONDANSETRON HCL 4 MG PO TABS: 4.0000 mg | ORAL_TABLET | Freq: Four times a day (QID) | ORAL | Status: DC | PRN

## 2014-04-08 MED ORDER — PROPOFOL 10 MG/ML IV BOLUS
INTRAVENOUS | Status: AC
  Filled 2014-04-08: qty 20

## 2014-04-08 MED ORDER — CEFAZOLIN SODIUM-DEXTROSE 2-3 GM-% IV SOLR
INTRAVENOUS | Status: AC
  Filled 2014-04-08: qty 50

## 2014-04-08 MED ORDER — CEFAZOLIN SODIUM-DEXTROSE 2-3 GM-% IV SOLR
INTRAVENOUS | Status: DC | PRN
  Administered 2014-04-08: 2 g via INTRAVENOUS

## 2014-04-08 MED ORDER — PANTOPRAZOLE SODIUM 40 MG PO TBEC
40.0000 mg | DELAYED_RELEASE_TABLET | Freq: Two times a day (BID) | ORAL | Status: DC
  Administered 2014-04-08 – 2014-04-11 (×7): 40 mg via ORAL
  Filled 2014-04-08 (×7): qty 1

## 2014-04-08 MED ORDER — HYDROMORPHONE HCL PF 1 MG/ML IJ SOLN
0.5000 mg | INTRAMUSCULAR | Status: DC | PRN
  Administered 2014-04-08 – 2014-04-09 (×2): 0.5 mg via INTRAVENOUS
  Filled 2014-04-08: qty 1

## 2014-04-08 MED ORDER — POLYETHYLENE GLYCOL 3350 17 G PO PACK
17.0000 g | PACK | Freq: Every day | ORAL | Status: DC | PRN
  Filled 2014-04-08: qty 1

## 2014-04-08 MED ORDER — MORPHINE SULFATE 2 MG/ML IJ SOLN: 0.5000 mg | INTRAMUSCULAR | Status: DC | PRN

## 2014-04-08 MED ORDER — PHENYLEPHRINE HCL 10 MG/ML IJ SOLN
10.0000 mg | INTRAMUSCULAR | Status: DC | PRN
  Administered 2014-04-08: 80 ug/min via INTRAVENOUS

## 2014-04-08 MED ORDER — METHOCARBAMOL 500 MG PO TABS
500.0000 mg | ORAL_TABLET | Freq: Four times a day (QID) | ORAL | Status: DC | PRN
  Administered 2014-04-08 – 2014-04-11 (×5): 500 mg via ORAL
  Filled 2014-04-08 (×4): qty 1

## 2014-04-08 MED ORDER — ONDANSETRON HCL 4 MG/2ML IJ SOLN
4.0000 mg | Freq: Four times a day (QID) | INTRAMUSCULAR | Status: DC | PRN
  Administered 2014-04-08: 4 mg via INTRAVENOUS

## 2014-04-08 MED ORDER — EPHEDRINE SULFATE 50 MG/ML IJ SOLN
INTRAMUSCULAR | Status: AC
  Filled 2014-04-08: qty 1

## 2014-04-08 MED ORDER — PHENOL 1.4 % MT LIQD
1.0000 | OROMUCOSAL | Status: DC | PRN
  Filled 2014-04-08: qty 177

## 2014-04-08 SURGICAL SUPPLY — 38 items
BLADE SAGITTAL (BLADE) ×2
BLADE SAW THK.89X75X18XSGTL (BLADE) ×1 IMPLANT
CAPT HIP FX BIPOLAR/UNIPOLAR ×1 IMPLANT
COVER SURGICAL LIGHT HANDLE (MISCELLANEOUS) ×2 IMPLANT
DRAPE HIP W/POCKET STRL (DRAPE) ×2 IMPLANT
DRAPE INCISE IOBAN 85X60 (DRAPES) ×4 IMPLANT
DRAPE U-SHAPE 47X51 STRL (DRAPES) ×2 IMPLANT
DRSG MEPILEX BORDER 4X8 (GAUZE/BANDAGES/DRESSINGS) ×2 IMPLANT
DURAPREP 26ML APPLICATOR (WOUND CARE) ×2 IMPLANT
ELECT BLADE 6.5 EXT (BLADE) IMPLANT
ELECT CAUTERY BLADE 6.4 (BLADE) ×2 IMPLANT
ELECT REM PT RETURN 9FT ADLT (ELECTROSURGICAL) ×2
ELECTRODE REM PT RTRN 9FT ADLT (ELECTROSURGICAL) ×1 IMPLANT
EVACUATOR 1/8 PVC DRAIN (DRAIN) IMPLANT
GLOVE BIOGEL PI IND STRL 7.5 (GLOVE) ×1 IMPLANT
GLOVE BIOGEL PI IND STRL 8 (GLOVE) ×1 IMPLANT
GLOVE BIOGEL PI INDICATOR 7.5 (GLOVE) ×1
GLOVE BIOGEL PI INDICATOR 8 (GLOVE) ×1
GLOVE ORTHO TXT STRL SZ7.5 (GLOVE) ×4 IMPLANT
GOWN STRL REUS W/ TWL LRG LVL3 (GOWN DISPOSABLE) ×2 IMPLANT
GOWN STRL REUS W/ TWL XL LVL3 (GOWN DISPOSABLE) ×4 IMPLANT
GOWN STRL REUS W/TWL LRG LVL3 (GOWN DISPOSABLE) ×4
GOWN STRL REUS W/TWL XL LVL3 (GOWN DISPOSABLE) ×8
KIT BASIN OR (CUSTOM PROCEDURE TRAY) ×2 IMPLANT
KIT ROOM TURNOVER OR (KITS) ×2 IMPLANT
MANIFOLD NEPTUNE II (INSTRUMENTS) ×1 IMPLANT
NS IRRIG 1000ML POUR BTL (IV SOLUTION) ×2 IMPLANT
PACK TOTAL JOINT (CUSTOM PROCEDURE TRAY) ×2 IMPLANT
PAD ARMBOARD 7.5X6 YLW CONV (MISCELLANEOUS) ×4 IMPLANT
STAPLER VISISTAT 35W (STAPLE) ×2 IMPLANT
SUT ETHIBOND NAB CT1 #1 30IN (SUTURE) ×4 IMPLANT
SUT VIC AB 1 CTB1 27 (SUTURE) ×4 IMPLANT
SUT VIC AB 2-0 CT1 27 (SUTURE) ×4
SUT VIC AB 2-0 CT1 TAPERPNT 27 (SUTURE) ×2 IMPLANT
TOWEL OR 17X24 6PK STRL BLUE (TOWEL DISPOSABLE) ×2 IMPLANT
TOWEL OR 17X26 10 PK STRL BLUE (TOWEL DISPOSABLE) ×2 IMPLANT
TRAY FOLEY CATH 16FRSI W/METER (SET/KITS/TRAYS/PACK) IMPLANT
WATER STERILE IRR 1000ML POUR (IV SOLUTION) ×6 IMPLANT

## 2014-04-08 NOTE — Transfer of Care (Signed)
Immediate Anesthesia Transfer of Care Note  Patient: [name]  Procedure(s) Performed: Procedure(s): ARTHROPLASTY BIPOLAR HIP (Right)  Patient Location: PACU  Anesthesia Type: general  Level of Consciousness: sedated  Airway & Oxygen Therapy: Patient Spontanous Breathing and Patient connected to face mask oxygen  Post-op Assessment: Report given to PACU RN and Post -op Vital signs reviewed and stable  Post vital signs: Reviewed and stable  Complications: No apparent anesthesia complications

## 2014-04-08 NOTE — Anesthesia Postprocedure Evaluation (Signed)
  Anesthesia Post-op Note  Patient: [name]  Procedure(s) Performed: Procedure(s): ARTHROPLASTY BIPOLAR HIP (Right)  Patient Location: PACU  Anesthesia Type:General  Level of Consciousness: awake and alert   Airway and Oxygen Therapy: Patient Spontanous Breathing  Post-op Pain: none  Post-op Assessment: Post-op Vital signs reviewed, Patient's Cardiovascular Status Stable and Respiratory Function Stable  Post-op Vital Signs: Reviewed  Filed Vitals:   04/08/14 1624  BP: 155/89  Pulse:   Temp: 36.7 C  Resp:     Complications: No apparent anesthesia complications

## 2014-04-08 NOTE — ED Provider Notes (Signed)
CSN: [id number]     Arrival date & time 04/07/14  2333 History   First MD Initiated Contact with Patient 04/08/14 0012     Chief Complaint  Patient presents with  . Fall     (Consider location/radiation/quality/duration/timing/severity/associated sxs/prior Treatment) HPI Patient is an 80 year old woman who is anticoagulated with Coumadin for a history of atrial fibrillation.  Today, she bent over to try to pick up a paper clip, lost her balance, fell and landed against her right hip. She denies head trauma and loss of consciousness.  As the injury, she has had severe, 10 over 10 right-sided hip pain which is worse with any movement of the right leg and any attempts to move her torso. The pain is nonradiating. She denies paresthesias and motor weakness. She is not experience chest pain or shortness of breath.  The patient's daughter insists that the patient have operative repair reformed by Dr. Veverly Fells.     Past Medical History  Diagnosis Date  . Hyperlipidemia   . Allergy   . Osteoporosis   . Hypertension    Past Surgical History  Procedure Laterality Date  . Cardiac electrophysiology mapping and ablation     History reviewed. No pertinent family history. History  Substance Use Topics  . Smoking status: Former Research scientist (life sciences)  . Smokeless tobacco: Not on file  . Alcohol Use: Not on file   OB History   Grav Para Term Preterm Abortions TAB SAB Ect Mult Living                 Review of Systems Ten point review of symptoms performed and is negative with the exception of symptoms noted above.     Allergies  Amiodarone; Contrast media; Flecainide; Latex; Multaq; and Sulfa antibiotics  Home Medications   Prior to Admission medications   Medication Sig Start Date End Date Taking? Authorizing Provider  acetaminophen (TYLENOL) 500 MG tablet Take 500 mg by mouth every 6 (six) hours as needed. For pain    Historical Provider, MD  Cholecalciferol (VITAMIN D PO) Take by mouth.     Historical Provider, MD  fexofenadine (ALLEGRA) 180 MG tablet Take 180 mg by mouth every morning.    Historical Provider, MD  pantoprazole (PROTONIX) 40 MG tablet Take 40 mg by mouth every 12 (twelve) hours.    Historical Provider, MD  potassium chloride (K-DUR) 10 MEQ tablet Take 10 mEq by mouth daily.    Historical Provider, MD  pravastatin (PRAVACHOL) 40 MG tablet Take 40 mg by mouth at bedtime.    Historical Provider, MD  risedronate (ACTONEL) 35 MG tablet Take 35 mg by mouth every 7 (seven) days. Sunday. with water on empty stomach, nothing by mouth or lie down for next 30 minutes.    Historical Provider, MD  sotalol (BETAPACE) 160 MG tablet Take 160 mg by mouth 2 (two) times daily.    Historical Provider, MD  sucralfate (CARAFATE) 1 G tablet Take 1 g by mouth 4 (four) times daily -  with meals and at bedtime.    Historical Provider, MD  venlafaxine (EFFEXOR-XR) 150 MG 24 hr capsule Take 150 mg by mouth every morning.    Historical Provider, MD  warfarin (COUMADIN) 2.5 MG tablet Take 2.5-3.75 mg by mouth daily. Take 1 tablet all days except Wednesday & Saturday take 1.5 tablet    Historical Provider, MD   BP 157/91  Pulse 78  Temp(Src) 98.9 F (37.2 C) (Oral)  Resp 19  Ht 5\' 8"  (1.727 m)  Wt 167 lb (75.751 kg)  BMI 25.40 kg/m2  SpO2 87% Physical Exam Gen: well developed and well nourished appearing Head: NCAT Eyes: PERL, EOMI Nose: no epistaixis or rhinorrhea Mouth/throat: mucosa is moist and pink Neck: supple, no stridor Lungs: CTA B, no wheezing, rhonchi or rales CV: iirreg irreg, no murmur, extremities appear well perfused.  Abd: soft, notender, nondistended Back: no ttp, no cva ttp Skin: warm and dry Ext: right hip is shortened and externally rotated, no attempt made to range the right hip or knee due to fx identified on xray. Knee, lower leg, foot are nontender and without deformity, NVI.  LLE: normal to inspection and without ttp, both UE: nontender.  Neuro: CN ii-xii  grossly intact, no focal deficits Psyche; normal affect,  calm and cooperative.   ED Course  Procedures (including critical care time) Labs Review  Results for orders placed during the hospital encounter of 04/07/14 (from the past 24 hour(s))  CBC WITH DIFFERENTIAL     Status: Abnormal   Collection Time    04/08/14  1:21 AM      Result Value Ref Range   WBC 7.4  4.0 - 10.5 K/uL   RBC 4.88  3.87 - 5.11 MIL/uL   Hemoglobin 13.5  12.0 - 15.0 g/dL   HCT 42.4  36.0 - 46.0 %   MCV 86.9  78.0 - 100.0 fL   MCH 27.7  26.0 - 34.0 pg   MCHC 31.8  30.0 - 36.0 g/dL   RDW 14.9  11.5 - 15.5 %   Platelets 149 (*) 150 - 400 K/uL   Neutrophils Relative % 77  43 - 77 %   Neutro Abs 5.7  1.7 - 7.7 K/uL   Lymphocytes Relative 16  12 - 46 %   Lymphs Abs 1.2  0.7 - 4.0 K/uL   Monocytes Relative 6  3 - 12 %   Monocytes Absolute 0.4  0.1 - 1.0 K/uL   Eosinophils Relative 1  0 - 5 %   Eosinophils Absolute 0.1  0.0 - 0.7 K/uL   Basophils Relative 0  0 - 1 %   Basophils Absolute 0.0  0.0 - 0.1 K/uL  BASIC METABOLIC PANEL     Status: Abnormal   Collection Time    04/08/14  1:21 AM      Result Value Ref Range   Sodium 143  137 - 147 mEq/L   Potassium 4.3  3.7 - 5.3 mEq/L   Chloride 103  96 - 112 mEq/L   CO2 27  19 - 32 mEq/L   Glucose, Bld 115 (*) 70 - 99 mg/dL   BUN 25 (*) 6 - 23 mg/dL   Creatinine, Ser 0.77  0.50 - 1.10 mg/dL   Calcium 9.2  8.4 - 10.5 mg/dL   GFR calc non Af Amer 75 (*) >90 mL/min   GFR calc Af Amer 87 (*) >90 mL/min  TYPE AND SCREEN     Status: None   Collection Time    04/08/14  1:22 AM      Result Value Ref Range   ABO/RH(D) O POS     Antibody Screen PENDING     Sample Expiration 04/11/2014      Imaging Review Dg Hip Complete Right  04/08/2014   CLINICAL DATA:  Fall.  EXAM: RIGHT HIP - COMPLETE 2+ VIEW  COMPARISON:  None.  FINDINGS: Severely displaced and angulated fracture involving the proximal right femoral neck is noted. Femoral head is located within  the  acetabulum.  IMPRESSION: Displaced and angulated proximal right femoral neck fracture.   Electronically Signed   By: Sabino Dick M.D.   On: 04/08/2014 01:10    EKG: nsr, no acute ischemic changes, normal intervals, normal axis, normal qrs complex  MDM   DDX: hip contusion v. Fracture v. Dislocation.   XRAYS confirm right femoral neck fx with displacement.   We are agressively managing pain associated with hip fracture.   Case discussed with Dr. Onnie Graham of [facility] who will consult later this morning. Case discussed with Dr. Reece Levy who has agrees to admit to the [facility]. I will place temp orders on his behalf.   Of note, the patient refuses a foley catheter.    Elyn Peers, MD 04/08/14 [phone]

## 2014-04-08 NOTE — Progress Notes (Signed)
OT Cancellation Note  Patient Details Name: [name] MRN: [id number] DOB: 01/21/1928   Cancelled Treatment:    Reason Eval/Treat Not Completed: Other (comment) Pt's current D/C plan is SNF. No apparent immediate acute care OT needs, therefore will defer OT to SNF. If OT eval is needed please call Acute Rehab Dept. at [phone] or text page OT at [phone].    Almon Register [id number] 04/08/2014, 9:10 PM

## 2014-04-08 NOTE — Consult Note (Signed)
Reason for Consult:  Right hip fracture Referring Physician: [facility]  [name] is an 80 y.o. female.  HPI:   80 yo female who fell late last evening injuring her right hip.  Was admitted to the Hospitalist's service early this am.  Another Ortho group in town was consulted per the family's request, but then the family decided that they did no want the person on-call to address the injury.  I have been now consulted to evaluate and treat her right hip fracture.  She is a very active 80 yo female with no previous right hip pain.  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  She fully understands her situation and her daughter is at the bedside and also understands.  Past Medical History  Diagnosis Date  . Hyperlipidemia   . Allergy   . Osteoporosis   . Hypertension   . A-fib     On chronic anticoagulation    Past Surgical History  Procedure Laterality Date  . Cardiac electrophysiology mapping and ablation      Family History  Problem Relation Age of Onset  . Aneurysm Mother   . Tuberculosis Father     Social History:  reports that she has quit smoking. She does not have any smokeless tobacco history on file. Her alcohol and drug histories are not on file.  Allergies:  Allergies  Allergen Reactions  . Contrast Media [Iodinated Diagnostic Agents] Anaphylaxis  . Amiodarone     Pulmonary edema  . Flecainide     Parkinson like raction  . Latex Itching and Swelling  . Multaq [Dronedarone Hydrochloride] Swelling  . Sulfa Antibiotics Other (See Comments)    Makes her sicker & it causes her to have dark rings around her eyes    Medications: I have reviewed the patient's current medications.  Results for orders placed during the hospital encounter of 04/07/14 (from the past 48 hour(s))  CBC WITH DIFFERENTIAL     Status: Abnormal   Collection Time    04/08/14  1:21 AM      Result Value Ref Range   WBC 7.4  4.0 - 10.5 K/uL   RBC 4.88   3.87 - 5.11 MIL/uL   Hemoglobin 13.5  12.0 - 15.0 g/dL   HCT 42.4  36.0 - 46.0 %   MCV 86.9  78.0 - 100.0 fL   MCH 27.7  26.0 - 34.0 pg   MCHC 31.8  30.0 - 36.0 g/dL   RDW 14.9  11.5 - 15.5 %   Platelets 149 (*) 150 - 400 K/uL   Neutrophils Relative % 77  43 - 77 %   Neutro Abs 5.7  1.7 - 7.7 K/uL   Lymphocytes Relative 16  12 - 46 %   Lymphs Abs 1.2  0.7 - 4.0 K/uL   Monocytes Relative 6  3 - 12 %   Monocytes Absolute 0.4  0.1 - 1.0 K/uL   Eosinophils Relative 1  0 - 5 %   Eosinophils Absolute 0.1  0.0 - 0.7 K/uL   Basophils Relative 0  0 - 1 %   Basophils Absolute 0.0  0.0 - 0.1 K/uL  BASIC METABOLIC PANEL     Status: Abnormal   Collection Time    04/08/14  1:21 AM      Result Value Ref Range   Sodium 143  137 - 147 mEq/L   Potassium 4.3  3.7 - 5.3 mEq/L   Chloride 103  96 - 112 mEq/L   CO2 27  19 - 32 mEq/L   Glucose, Bld 115 (*) 70 - 99 mg/dL   BUN 25 (*) 6 - 23 mg/dL   Creatinine, Ser 0.77  0.50 - 1.10 mg/dL   Calcium 9.2  8.4 - 10.5 mg/dL   GFR calc non Af Amer 75 (*) >90 mL/min   GFR calc Af Amer 87 (*) >90 mL/min   Comment: (NOTE)     The eGFR has been calculated using the CKD EPI equation.     This calculation has not been validated in all clinical situations.     eGFR's persistently <90 mL/min signify possible Chronic Kidney     Disease.  PROTIME-INR     Status: Abnormal   Collection Time    04/08/14  1:21 AM      Result Value Ref Range   Prothrombin Time 20.8 (*) 11.6 - 15.2 seconds   INR 1.79 (*) 0.00 - 1.49  APTT     Status: None   Collection Time    04/08/14  1:21 AM      Result Value Ref Range   aPTT 27  24 - 37 seconds  TYPE AND SCREEN     Status: None   Collection Time    04/08/14  1:22 AM      Result Value Ref Range   ABO/RH(D) O POS     Antibody Screen NEG     Sample Expiration 04/11/2014    ABO/RH     Status: None   Collection Time    04/08/14  1:22 AM      Result Value Ref Range   ABO/RH(D) O POS      Dg Chest 1 View  04/08/2014    CLINICAL DATA:  Fall.  EXAM: CHEST - 1 VIEW  COMPARISON:  Chest radiograph October 09, 2010  FINDINGS: The cardiac silhouette remains mild to moderately enlarged. Tortuous, possibly ectatic calcified aortic is similar. Mild chronic interstitial changes, elevated right hemidiaphragm. No pleural effusions or focal consolidations. No pneumothorax. Soft tissue planes are nonsuspicious. Moderate age indeterminate approximate T12 compression fracture appears new from prior chest radiograph.  IMPRESSION: Stable cardiomegaly, chronic interstitial changes.  Age indeterminate at least moderate T12 compression fracture, recommend correlation with point tenderness.   Electronically Signed   By: Elon Alas   On: 04/08/2014 01:15   Dg Hip Complete Right  04/08/2014   CLINICAL DATA:  Fall.  EXAM: RIGHT HIP - COMPLETE 2+ VIEW  COMPARISON:  None.  FINDINGS: Severely displaced and angulated fracture involving the proximal right femoral neck is noted. Femoral head is located within the acetabulum.  IMPRESSION: Displaced and angulated proximal right femoral neck fracture.   Electronically Signed   By: Sabino Dick M.D.   On: 04/08/2014 01:10    ROS Blood pressure 114/78, pulse 85, temperature 97.6 F (36.4 C), temperature source Oral, resp. rate 21, height _0  (1.727 m), weight 75.751 kg (167 lb), SpO2 95.00%. Physical Exam  Musculoskeletal:       Right hip: She exhibits decreased range of motion, decreased strength and deformity.  Her right leg is shortened and externally rotated.  Assessment/Plan: Right hip displaced femoral neck fracture 1)  I have discussed with [name] the risks and benefits of surgery including the risks of acute blood loss anemia, nerve and vessel injury, fracture, infection, DVT, dislocation and even death.  She understands the goals are decreased pain and improved mobility. We will proceed to the  OR today.  Mcarthur Rossetti 04/08/2014, 10:47 AM

## 2014-04-08 NOTE — Clinical Social Work Placement (Signed)
Clinical Social Work Department CLINICAL SOCIAL WORK PLACEMENT NOTE 04/08/2014  Patient:  [name], [name]  Account Number:  [id number] Admit date:  04/07/2014  Clinical Social Worker:  Carrington Clamp, LCSWA  Date/time:  04/08/2014 11:37 AM  Clinical Social Work is seeking post-discharge placement for this patient at the following level of care:   [place]   (*CSW will update this form in Epic as items are completed)   04/08/2014  Patient/family provided with [facility] Department of Clinical Social Work's list of facilities offering this level of care within the geographic area requested by the patient (or if unable, by the patient's family).  04/08/2014  Patient/family informed of their freedom to choose among providers that offer the needed level of care, that participate in Medicare, Medicaid or managed care program needed by the patient, have an available bed and are willing to accept the patient.  04/08/2014  Patient/family informed of [facility]' ownership interest in [facility], as well as of the fact that they are under no obligation to receive care at this facility.  PASARR submitted to EDS on 04/08/2014 PASARR number received on 04/08/2014  FL2 transmitted to all facilities in geographic area requested by pt/family on   FL2 transmitted to all facilities within larger geographic area on   Patient informed that his/her managed care company has contracts with or will negotiate with  certain facilities, including the following:     Patient/family informed of bed offers received:   Patient chooses bed at  Physician recommends and patient chooses bed at    Patient to be transferred to  on   Patient to be transferred to facility by  Patient and family notified of transfer on  Name of family member notified:    The following physician request were entered in Epic:   Additional Comments:  [place], [place] Weekend Clinical Social  Worker [phone]

## 2014-04-08 NOTE — Anesthesia Procedure Notes (Signed)
Procedure Name: Intubation Date/Time: 04/08/2014 2:59 PM Performed by: Marinda Elk A Pre-anesthesia Checklist: Patient identified, Timeout performed, Emergency Drugs available, Suction available and Patient being monitored Patient Re-evaluated:Patient Re-evaluated prior to inductionOxygen Delivery Method: Circle system utilized Preoxygenation: Pre-oxygenation with 100% oxygen Intubation Type: IV induction Ventilation: Mask ventilation without difficulty Tube type: Oral Tube size: 7.5 mm Number of attempts: 1 Airway Equipment and Method: Stylet and Video-laryngoscopy Placement Confirmation: ETT inserted through vocal cords under direct vision,  positive ETCO2 and breath sounds checked- equal and bilateral Secured at: 21 cm Tube secured with: Tape Dental Injury: Teeth and Oropharynx as per pre-operative assessment

## 2014-04-08 NOTE — Anesthesia Preprocedure Evaluation (Addendum)
Anesthesia Evaluation  Patient identified by MRN, date of birth, ID band Patient awake    Reviewed: Allergy & Precautions, H&P , NPO status , Patient's Chart, lab work & pertinent test results, reviewed documented beta blocker date and time   Airway Mallampati: III TM Distance: >3 FB Neck ROM: Full    Dental no notable dental hx. (+) Teeth Intact, Dental Advisory Given   Pulmonary neg pulmonary ROS, former smoker,    Pulmonary exam normal       Cardiovascular hypertension, On Medications and On Home Beta Blockers     Neuro/Psych negative neurological ROS  negative psych ROS   GI/Hepatic negative GI ROS, Neg liver ROS,   Endo/Other  negative endocrine ROS  Renal/GU negative Renal ROS  negative genitourinary   Musculoskeletal   Abdominal   Peds  Hematology negative hematology ROS (+)   Anesthesia Other Findings   Reproductive/Obstetrics negative OB ROS                         Anesthesia Physical Anesthesia Plan  ASA: III  Anesthesia Plan: General   Post-op Pain Management:    Induction: Intravenous  Airway Management Planned: Oral ETT  Additional Equipment:   Intra-op Plan:   Post-operative Plan: Extubation in OR  Informed Consent: I have reviewed the patients History and Physical, chart, labs and discussed the procedure including the risks, benefits and alternatives for the proposed anesthesia with the patient or authorized representative who has indicated his/her understanding and acceptance.   Dental advisory given  Plan Discussed with: CRNA  Anesthesia Plan Comments:         Anesthesia Quick Evaluation

## 2014-04-08 NOTE — ED Notes (Signed)
Pt undressed, clothes cut. Pt does not want bra cut, states they do not make them anymore,.

## 2014-04-08 NOTE — Clinical Social Work Psychosocial (Signed)
Clinical Social Work Department BRIEF PSYCHOSOCIAL ASSESSMENT 04/08/2014  Patient:  [name], [name]     Account Number:  [id number]     Admit date:  04/07/2014  Clinical Social Worker:  Hubert Azure  Date/Time:  04/08/2014 09:44 AM  Referred by:  Physician  Date Referred:  04/08/2014 Referred for  SNF Placement   Other Referral:   Interview type:  Patient Other interview type:   CSW additoinally spoke with patient's daughter [name] who was present at bedside.    PSYCHOSOCIAL DATA Living Status:  HUSBAND Admitted from facility:   Level of care:   Primary support name:  [name] Primary support relationship to patient:  SPOUSE Degree of support available:   Good    CURRENT CONCERNS Current Concerns  Post-Acute Placement   Other Concerns:    SOCIAL WORK ASSESSMENT / PLAN CSW met with patient who was alert and oriented x4. Patient's daughter [name]) was also present at bedside. CSW introduced self and explained role. CSW discussed d/c plan. Per daughter patient is very active working her garden and "throwing parties." Daughter stated patient is in a garden club and travels all the time with her spouse. Per daughter, patient will miss several parties this week as she is hospitalized. Patient reported losing her balance as she bent to pick up a paper clip at the bottom of the steps. Patient and daughter are agreeable to SNF placement and prefer [facility]. Per daughter, patient's husband broke hip 4 years ago and received therapy at [facility]. Daughter stated [facility] would be second preference in the event [facility] is not available.   Assessment/plan status:  Psychosocial Support/Ongoing Assessment of Needs Other assessment/ plan:   CSW to complete FL2 and update family on bed offers when received.   Information/referral to community resources:    PATIENT'S/FAMILY'S RESPONSE TO PLAN OF CARE: Patient and daughter were pleasant and expressed optimism  with SNF placement at [facility]. Patient and daughter thanked CSW for assistance with d/c plan.   [place], [place] Weekend Clinical Social Worker [phone]

## 2014-04-08 NOTE — Progress Notes (Addendum)
-  Post-op VSS on arrival to [facility] [place] -pt resting quietly w/minimal pain -family at bedside

## 2014-04-08 NOTE — Op Note (Signed)
NAMEROYLENE, HEATON NO.:  [id number]  MEDICAL RECORD NO.:  [id number]  LOCATION:  [id number]                        FACILITY:  [facility]  PHYSICIAN:  Lind Guest. Ninfa Linden, M.D.DATE OF BIRTH:  [birth year]  DATE OF PROCEDURE:  04/08/2014 DATE OF DISCHARGE:                              OPERATIVE REPORT   PREOPERATIVE DIAGNOSIS:  Displaced right hip femoral neck fracture.  POSTOPERATIVE DIAGNOSIS:  Displaced right hip femoral neck fracture.  PROCEDURE:  Right hip hemiarthroplasty.  IMPLANTS:  DePuy Summit Basic press-fit femoral component size 4, size 48+ 0 unipolar metal femoral head.  SURGEON:  Jean Rosenthal MD.  ASSISTING:  Erskine Emery, PA-C  ANESTHESIA:  General.  ANTIBIOTICS:  2 g IV Ancef.  BLOOD LOSS:  200-250 mL.  COMPLICATIONS:  None.  INDICATIONS:  Ms. Castilla is a 80 year old female female who sustained an accidental mechanical fall when she slipped at her daughter's home last evening.  She landed directly on her right hip and had the inability to ambulate.  She was seen at the [facility] emergency room and admitted to hospital service with a right hip fracture.  She was on Coumadin, but is now nontherapeutic on her INR.  I have talked to the family in length about the risks and benefits of surgery including the risk of acute blood loss anemia, nerve and vessel injury, fracture infection, dislocation, DVT, and death.  They understand the goals are decreased pain, improved mobility, and overall pro quality of life.  PROCEDURE DESCRIPTION:  After informed consent was obtained, appropriate right hip was marked.  She was brought to the operating room.  General anesthesia was obtained while she was on a stretcher.  She was next placed supine on the table and turned in lateral decubitus position with the right operative hip up, padding of the down on operative left leg, hip positioners in the front and the back with padding, and axillary roll  in place and adequate positioning of her arms, hip, and neck.  Her right operative hip was then prepped and draped with DuraPrep and sterile drapes.  Time-out was called and she was identified as correct patient, correct right hip.  I then made incision of the greater trochanter and carried this proximally and distally.  I dissected down to the iliotibial band.  The iliotibial band was divided longitudinally. I then proceeded with an anterolateral approach to the hip.  I took off a small sleeve of gluteus medius and minimus tendons off of the greater trochanter and reflected these anteriorly.  I opened up the hip capsule and found a large hematoma and displaced femoral neck fracture.  I placed Cobra retractors around the remnants of the femoral neck and made my femoral neck cut proximal to the lesser trochanter.  I removed remnants of the neck and then I placed a corkscrew guide in the femoral head and removed the femoral head in its entirety.  We trialed different size for size 48 unipolar head.  Attention was then turned to the femur. With the leg flexed and externally rotated off the field and down into a leg bag, we were able to use initiating reamer up the femoral canal  on a canal finding guide as well lateralizing reamer.  We then began reaming, broaching from a size 1 broach using a press fit broaching system from DePuy up to size 4  With a size 4 feeling tight and stable, we trialed a 48 head with a +0 spacer and reduced this in acetabulum, it was stable throughout its arc of motion.  I was pleased with her leg lengths as well.  I then dislocated the hip and removed the trial components.  I then placed the real press-fit Summit Basic femoral component size 4 and the real 48 +0 unipolar metal head.  We reduced this back and the acetabulum was stable again.  We copiously irrigated the soft tissues with normal saline solution and closed the joint capsule with interrupted #1 Ethibond  suture.  We reapproximated the gluteus medius and minimus tendons, carried back to the greater trochanter, and oversew of these with #1 Ethibond suture.  We closed the iliotibial band with #1 Vicryl followed by 0 Vicryl in the deep tissue, 2-0 Vicryl in subcutaneous tissue, and staples on the skin.  Xeroform and a well- padded sterile dressing was applied.  She was turned back into supine position, awakened, extubated, and taken to recovery room in stable condition.  All final counts correct.  There were no complications noted.  Postoperatively, I will initiate therapy with weightbearing as tolerated and the anterior lateral hip precautions.  Of note, Erskine Emery, PA-C, assisted in the entire case and this assistance was crucial in facilitating this case.     Lind Guest. Ninfa Linden, M.D.     CYB/MEDQ  D:  04/08/2014  T:  04/08/2014  Job:  [id number]

## 2014-04-08 NOTE — H&P (Signed)
Patient's PCP: Tivis Ringer, MD Patient's cardiologist: Dr. Einar Gip  Chief Complaint: Fall and right hip pain  History of Present Illness: [name] is a 80 y.o. Caucasian female this history of hyperlipidemia, hypertension, osteoporosis, multiple allergies, and atrial fibrillation on chronic anticoagulation who presents with the above complaints.  Patient reports that around 1020 p.m. last night, she bent down to pick up a paper clip.  She lost balance and stumbled backwards few steps, and landed on her right hip and right side.  Subsequently after the fall, she had significant right hip pain.  She denies hitting her head or losing consciousness.  EMS was called to bring the patient to the emergency department.  Imaging showed displaced and angulated proximal right femoral neck fracture.  Hospitalist service was asked to admit the patient for further care and management.  She denies any recent fevers, chills, nausea, vomiting, chest pain, shortness of breath, abdominal pain, diarrhea, headaches or vision changes.  Review of Systems: All systems reviewed with the patient and positive as per history of present illness, otherwise all other systems are negative.  Past Medical History  Diagnosis Date  . Hyperlipidemia   . Allergy   . Osteoporosis   . Hypertension   . A-fib     On chronic anticoagulation   Past Surgical History  Procedure Laterality Date  . Cardiac electrophysiology mapping and ablation     Family History  Problem Relation Age of Onset  . Aneurysm Mother   . Tuberculosis Father    History   Social History  . Marital Status: Married    Spouse Name: N/A    Number of Children: N/A  . Years of Education: N/A   Occupational History  . Not on file.   Social History Main Topics  . Smoking status: Former Research scientist (life sciences)  . Smokeless tobacco: Not on file  . Alcohol Use: Not on file  . Drug Use: Not on file  . Sexual Activity: Not on file   Other Topics Concern  . Not  on file   Social History Narrative  . No narrative on file   Allergies: Contrast media; Amiodarone; Flecainide; Latex; Multaq; and Sulfa antibiotics  Home Meds: Prior to Admission medications   Medication Sig Start Date End Date Taking? Authorizing Provider  acetaminophen (TYLENOL) 500 MG tablet Take 500 mg by mouth every 6 (six) hours as needed. For pain   Yes Historical Provider, MD  fexofenadine (ALLEGRA) 180 MG tablet Take 180 mg by mouth every morning.   Yes Historical Provider, MD  pantoprazole (PROTONIX) 40 MG tablet Take 40 mg by mouth every 12 (twelve) hours.   Yes Historical Provider, MD  potassium chloride (K-DUR) 10 MEQ tablet Take 10 mEq by mouth 2 (two) times daily.    Yes Historical Provider, MD  pravastatin (PRAVACHOL) 40 MG tablet Take 40 mg by mouth at bedtime.   Yes Historical Provider, MD  risedronate (ACTONEL) 35 MG tablet Take 35 mg by mouth every 7 (seven) days. Sunday. with water on empty stomach, nothing by mouth or lie down for next 30 minutes.   Yes Historical Provider, MD  sotalol (BETAPACE) 120 MG tablet Take 120 mg by mouth 2 (two) times daily.   Yes Historical Provider, MD  sucralfate (CARAFATE) 1 G tablet Take 1 g by mouth 4 (four) times daily -  with meals and at bedtime.   Yes Historical Provider, MD  traMADol (ULTRAM) 50 MG tablet Take 50 mg by mouth every 6 (six) hours  as needed for moderate pain.   Yes Historical Provider, MD  venlafaxine (EFFEXOR) 75 MG tablet Take 75 mg by mouth daily.   Yes Historical Provider, MD  Vitamin D, Ergocalciferol, (DRISDOL) 50000 UNITS CAPS capsule Take 50,000 Units by mouth every 7 (seven) days. Wednesday   Yes Historical Provider, MD  warfarin (COUMADIN) 2.5 MG tablet Take 2.5-3.75 mg by mouth daily. Take 2.5mg  on all days except Wednesday. Ttake 3.5 tablet   Yes Historical Provider, MD    Physical Exam: Blood pressure 158/91, pulse 87, temperature 98.9 F (37.2 C), temperature source Oral, resp. rate 22, height 5\' 8"   (1.727 m), weight 75.751 kg (167 lb), SpO2 93.00%. General: Awake, Oriented x3, No acute distress. HEENT: EOMI, Moist mucous membranes Neck: Supple CV: S1 and S2 Lungs: Clear to ascultation bilaterally Abdomen: Soft, Nontender, Nondistended, +bowel sounds. Ext: Trace edema. No clubbing or cyanosis noted.  Bilateral lower extremities slightly cool, but good pulses. Neuro: Cranial Nerves II-XII grossly intact. Has 5/5 motor strength in upper and lower extremities.  Lab results:  Recent Labs  04/08/14 0121  NA 143  K 4.3  CL 103  CO2 27  GLUCOSE 115*  BUN 25*  CREATININE 0.77  CALCIUM 9.2   No results found for this basename: AST, ALT, ALKPHOS, BILITOT, PROT, ALBUMIN,  in the last 72 hours No results found for this basename: LIPASE, AMYLASE,  in the last 72 hours  Recent Labs  04/08/14 0121  WBC 7.4  NEUTROABS 5.7  HGB 13.5  HCT 42.4  MCV 86.9  PLT 149*   No results found for this basename: CKTOTAL, CKMB, CKMBINDEX, TROPONINI,  in the last 72 hours No components found with this basename: POCBNP,  No results found for this basename: DDIMER,  in the last 72 hours No results found for this basename: HGBA1C,  in the last 72 hours No results found for this basename: CHOL, HDL, LDLCALC, TRIG, CHOLHDL, LDLDIRECT,  in the last 72 hours No results found for this basename: TSH, T4TOTAL, FREET3, T3FREE, THYROIDAB,  in the last 72 hours No results found for this basename: VITAMINB12, FOLATE, FERRITIN, TIBC, IRON, RETICCTPCT,  in the last 72 hours Imaging results:  Dg Chest 1 View  04/08/2014   CLINICAL DATA:  Fall.  EXAM: CHEST - 1 VIEW  COMPARISON:  Chest radiograph October 09, 2010  FINDINGS: The cardiac silhouette remains mild to moderately enlarged. Tortuous, possibly ectatic calcified aortic is similar. Mild chronic interstitial changes, elevated right hemidiaphragm. No pleural effusions or focal consolidations. No pneumothorax. Soft tissue planes are nonsuspicious. Moderate age  indeterminate approximate T12 compression fracture appears new from prior chest radiograph.  IMPRESSION: Stable cardiomegaly, chronic interstitial changes.  Age indeterminate at least moderate T12 compression fracture, recommend correlation with point tenderness.   Electronically Signed   By: Elon Alas   On: 04/08/2014 01:15   Dg Hip Complete Right  04/08/2014   CLINICAL DATA:  Fall.  EXAM: RIGHT HIP - COMPLETE 2+ VIEW  COMPARISON:  None.  FINDINGS: Severely displaced and angulated fracture involving the proximal right femoral neck is noted. Femoral head is located within the acetabulum.  IMPRESSION: Displaced and angulated proximal right femoral neck fracture.   Electronically Signed   By: Sabino Dick M.D.   On: 04/08/2014 01:10   Other results: EKG: Sinus rhythm.  Assessment & Plan by Problem: Right femoral neck fracture Management as per orthopedic service.  Patient is currently n.p.o.  Patient is anticoagulated on Coumadin, defer to orthopedic service  to what degree INR needs to be reversed prior to surgery.  Given patient's risk factors, patient is at moderate risk for surgery, recommend no further cardiac workup.  Patient's will likely need PT/OT evaluation for consideration of placement after surgery.  Hypertension Resume home antihypertensive medications.  Hyperlipidemia Continue statin.  Osteoporosis Held Bisphosphonates for now.  Continue vitamin D.  Atrial fibrillation Continue home medications.  Rate controlled.  Coumadin held in anticipation for surgery.  Prophylaxis INR 1.79, subtherapeutic. Patient placed on compressions.  Depending on patient's surgery, will need to determine modality of anticoagulation.  CODE STATUS  Full code.  Disposition Pending.  Likely will need placement.  Time spent on admission, talking to the patient, and coordinating care was: 50 mins.  Jeriko Kowalke A, MD 04/08/2014, 2:47 AM

## 2014-04-08 NOTE — Brief Op Note (Signed)
04/07/2014 - 04/08/2014  4:01 PM  PATIENT:  [name]  80 y.o. female  PRE-OPERATIVE DIAGNOSIS:  right femoral neck fracture  POST-OPERATIVE DIAGNOSIS:  same  PROCEDURE:  Procedure(s): ARTHROPLASTY BIPOLAR HIP (Right)  SURGEON:  Surgeon(s) and Role:    * Mcarthur Rossetti, MD - Primary  PHYSICIAN ASSISTANT: Benita Stabile, PA-C  ANESTHESIA:   general  EBL:  Total I/O In: 1000 [I.V.:1000] Out: 335 [Urine:85; Blood:250]  BLOOD ADMINISTERED:none  DRAINS: none   LOCAL MEDICATIONS USED:  NONE  SPECIMEN:  No Specimen  DISPOSITION OF SPECIMEN:  N/A  COUNTS:  YES  TOURNIQUET:  * No tourniquets in log *  DICTATION: .Other Dictation: Dictation Number [id number]  PLAN OF CARE: Admit to inpatient   PATIENT DISPOSITION:  PACU - hemodynamically stable.   Delay start of Pharmacological VTE agent (>24hrs) due to surgical blood loss or risk of bleeding: no

## 2014-04-08 NOTE — Progress Notes (Signed)
Patient seen and examined earlier today by my colleague Dr. Reece Levy. Patient seen and examined, and data base reviewed. Seen with daughter at bedside who is a Marine scientist at the behavioral health center. Pain is controlled with IV Dilaudid, patient is n.p.o. INR is 1.79. Spoke with Dr. Zollie Beckers who is going to. Likely in the afternoon today. Family informed.  Birdie Hopes Pager: [phone] 04/08/2014, 9:58 AM

## 2014-04-08 NOTE — Progress Notes (Signed)
ANTICOAGULATION CONSULT NOTE - Initial Consult  Pharmacy Consult for Coumadin Indication: atrial fibrillation, VTE px s/p ortho procedure  Allergies  Allergen Reactions  . Contrast Media [Iodinated Diagnostic Agents] Anaphylaxis  . Amiodarone     Pulmonary edema  . Flecainide     Parkinson like raction  . Latex Itching and Swelling  . Multaq [Dronedarone Hydrochloride] Swelling  . Sulfa Antibiotics Other (See Comments)    Makes her sicker & it causes her to have dark rings around her eyes    Patient Measurements: Height: 5\' 8"  (172.7 cm) Weight: 167 lb (75.751 kg) IBW/kg (Calculated) : 63.9 Heparin Dosing Weight:   Vital Signs: Temp: 98.1 F (36.7 C) (06/28 1800) Temp src: Oral (06/28 0825) BP: 120/75 mmHg (06/28 1800) Pulse Rate: 67 (06/28 1800)  Labs:  Recent Labs  04/08/14 0121  HGB 13.5  HCT 42.4  PLT 149*  APTT 27  LABPROT 20.8*  INR 1.79*  CREATININE 0.77    Estimated Creatinine Clearance: 52.8 ml/min (by C-G formula based on Cr of 0.77).   Medical History: Past Medical History  Diagnosis Date  . Hyperlipidemia   . Allergy   . Osteoporosis   . Hypertension   . A-fib     On chronic anticoagulation  . PONV (postoperative nausea and vomiting)   . Dysrhythmia   . Peripheral vascular disease   . Pneumonia   . GERD (gastroesophageal reflux disease)   . Headache(784.0)     Medications:  Prescriptions prior to admission  Medication Sig Dispense Refill  . acetaminophen (TYLENOL) 500 MG tablet Take 500 mg by mouth every 6 (six) hours as needed. For pain      . fexofenadine (ALLEGRA) 180 MG tablet Take 180 mg by mouth every morning.      . pantoprazole (PROTONIX) 40 MG tablet Take 40 mg by mouth every 12 (twelve) hours.      . potassium chloride (K-DUR) 10 MEQ tablet Take 10 mEq by mouth 2 (two) times daily.       . pravastatin (PRAVACHOL) 40 MG tablet Take 40 mg by mouth at bedtime.      . risedronate (ACTONEL) 35 MG tablet Take 35 mg by mouth  every 7 (seven) days. Sunday. with water on empty stomach, nothing by mouth or lie down for next 30 minutes.      . sotalol (BETAPACE) 120 MG tablet Take 120 mg by mouth 2 (two) times daily.      . sucralfate (CARAFATE) 1 G tablet Take 1 g by mouth 4 (four) times daily -  with meals and at bedtime.      . traMADol (ULTRAM) 50 MG tablet Take 50 mg by mouth every 6 (six) hours as needed for moderate pain.      Marland Kitchen venlafaxine (EFFEXOR) 75 MG tablet Take 75 mg by mouth daily.      . Vitamin D, Ergocalciferol, (DRISDOL) 50000 UNITS CAPS capsule Take 50,000 Units by mouth every 7 (seven) days. Wednesday      . warfarin (COUMADIN) 2.5 MG tablet Take 1.25-2.5 mg by mouth daily. 2.5mg  daily except 1.25mg  on Wednesdays        Assessment: 86yof s/p ortho hip procedure to continue Coumadin for Afib and VTE prophylaxis. INR was 1.79 this AM. Per patient, home regimen is 2.5mg  daily except 1.25mg  on Wednesdays - will give increased dose with subtherapeutic INR and follow-up AM INR. - H/H and Plts wnl - No significant bleeding reported  Goal of Therapy:  INR 2-3  Plan:  1. Coumadin 4mg  po x 1 today 2. Daily INR  Earleen Newport [phone] 04/08/2014,6:26 PM

## 2014-04-09 ENCOUNTER — Ambulatory Visit: Payer: Medicare Other | Admitting: Podiatry

## 2014-04-09 ENCOUNTER — Encounter (HOSPITAL_COMMUNITY): Payer: Self-pay | Admitting: General Practice

## 2014-04-09 DIAGNOSIS — M810 Age-related osteoporosis without current pathological fracture: Secondary | ICD-10-CM

## 2014-04-09 DIAGNOSIS — W19XXXA Unspecified fall, initial encounter: Secondary | ICD-10-CM

## 2014-04-09 DIAGNOSIS — Z7901 Long term (current) use of anticoagulants: Secondary | ICD-10-CM

## 2014-04-09 LAB — BASIC METABOLIC PANEL
BUN: 20 mg/dL (ref 6–23)
CHLORIDE: 103 meq/L (ref 96–112)
CO2: 27 mEq/L (ref 19–32)
Calcium: 8.1 mg/dL — ABNORMAL LOW (ref 8.4–10.5)
Creatinine, Ser: 0.9 mg/dL (ref 0.50–1.10)
GFR calc Af Amer: 66 mL/min — ABNORMAL LOW (ref >90)
GFR, EST NON AFRICAN AMERICAN: 57 mL/min — AB (ref >90)
GLUCOSE: 148 mg/dL — AB (ref 70–99)
POTASSIUM: 4.4 meq/L (ref 3.7–5.3)
Sodium: 140 mEq/L (ref 137–147)

## 2014-04-09 LAB — PROTIME-INR
INR: 2.1 — ABNORMAL HIGH (ref 0.00–1.49)
Prothrombin Time: 23.6 seconds — ABNORMAL HIGH (ref 11.6–15.2)

## 2014-04-09 LAB — CBC
HEMATOCRIT: 37.1 % (ref 36.0–46.0)
HEMOGLOBIN: 11.6 g/dL — AB (ref 12.0–15.0)
MCH: 28 pg (ref 26.0–34.0)
MCHC: 31.3 g/dL (ref 30.0–36.0)
MCV: 89.6 fL (ref 78.0–100.0)
Platelets: 124 10*3/uL — ABNORMAL LOW (ref 150–400)
RBC: 4.14 MIL/uL (ref 3.87–5.11)
RDW: 15.1 % (ref 11.5–15.5)
WBC: 6 10*3/uL (ref 4.0–10.5)

## 2014-04-09 MED ORDER — WARFARIN SODIUM 2.5 MG PO TABS
2.5000 mg | ORAL_TABLET | ORAL | Status: DC
  Administered 2014-04-09 – 2014-04-10 (×2): 2.5 mg via ORAL
  Filled 2014-04-09 (×2): qty 1

## 2014-04-09 MED ORDER — WARFARIN 1.25 MG HALF TABLET
1.2500 mg | ORAL_TABLET | ORAL | Status: DC
  Filled 2014-04-09: qty 1

## 2014-04-09 MED ORDER — SODIUM CHLORIDE 0.9 % IV BOLUS (SEPSIS)
500.0000 mL | Freq: Once | INTRAVENOUS | Status: AC
  Administered 2014-04-09: 500 mL via INTRAVENOUS

## 2014-04-09 MED ORDER — SOTALOL HCL 120 MG PO TABS
120.0000 mg | ORAL_TABLET | Freq: Two times a day (BID) | ORAL | Status: DC
  Administered 2014-04-09 – 2014-04-11 (×4): 120 mg via ORAL
  Filled 2014-04-09 (×5): qty 1

## 2014-04-09 MED ORDER — HYDROCODONE-ACETAMINOPHEN 5-325 MG PO TABS: 1.0000 | ORAL_TABLET | ORAL | Status: DC | PRN

## 2014-04-09 MED ORDER — HYDROCODONE-ACETAMINOPHEN 5-325 MG PO TABS
1.0000 | ORAL_TABLET | Freq: Four times a day (QID) | ORAL | Status: DC | PRN
  Administered 2014-04-09: 1 via ORAL
  Filled 2014-04-09 (×2): qty 1

## 2014-04-09 MED ORDER — HYDROMORPHONE HCL PF 1 MG/ML IJ SOLN: 0.5000 mg | Freq: Two times a day (BID) | INTRAMUSCULAR | Status: DC | PRN

## 2014-04-09 NOTE — Progress Notes (Signed)
Subjective: 1 Day Post-Op Procedure(s) (LRB): ARTHROPLASTY BIPOLAR HIP (Right) Patient reports pain as moderate.  Vitals stable.  Minimal drop in hemogoblin (minimla acute blood loss anemia).  Tolerated right hip surgery yesterday.  Objective: Vital signs in last 24 hours: Temp:  [97.6 F (36.4 C)-98.1 F (36.7 C)] 98 F (36.7 C) (06/29 0609) Pulse Rate:  [67-85] 73 (06/29 0609) Resp:  [13-21] 18 (06/29 0609) BP: (100-155)/(58-89) 100/64 mmHg (06/29 0609) SpO2:  [95 %-99 %] 98 % (06/29 0609)  Intake/Output from previous day: 06/28 0701 - 06/29 0700 In: 2245 [P.O.:120; I.V.:2125] Out: 610 [Urine:360; Blood:250] Intake/Output this shift: Total I/O In: 120 [P.O.:120] Out: -    Recent Labs  04/08/14 0121 04/09/14 0612  HGB 13.5 11.6*    Recent Labs  04/08/14 0121 04/09/14 0612  WBC 7.4 6.0  RBC 4.88 4.14  HCT 42.4 37.1  PLT 149* 124*    Recent Labs  04/08/14 0121  NA 143  K 4.3  CL 103  CO2 27  BUN 25*  CREATININE 0.77  GLUCOSE 115*  CALCIUM 9.2    Recent Labs  04/08/14 0121 04/09/14 0612  INR 1.79* 2.10*    Sensation intact distally Intact pulses distally Dorsiflexion/Plantar flexion intact Incision: dressing C/D/I  Assessment/Plan: 1 Day Post-Op Procedure(s) (LRB): ARTHROPLASTY BIPOLAR HIP (Right) Up with therapy, PT/OT - WBAT right hip; anterior hip precautions. Resumed coumadin  BLACKMAN,CHRISTOPHER Y 04/09/2014, 6:56 AM

## 2014-04-09 NOTE — Evaluation (Signed)
Physical Therapy Evaluation Patient Details Name: [name] MRN: [id number] DOB: 1928-01-25 Today's Date: 04/09/2014   History of Present Illness  Pt is an 80 y/o female presenting with R femur fracture after mechanical fall at home. Pt is now s/p R bipolar hip arthroplasty with ant-lat hip precautions per op-note.  Clinical Impression  This patient presents with acute pain and decreased functional independence following the above mentioned procedure. At the time of PT eval, pt was limited by pain and was often so guarded in movement that it was difficult for the therapist to assist her with mobility. Was able to eventually stand-pivot-transfer from bed to chair with mod assist. This patient is appropriate for skilled PT interventions to address functional limitations, improve safety and independence with functional mobility, and return to PLOF.     Follow Up Recommendations SNF;Supervision/Assistance - 24 hour    Equipment Recommendations  Rolling walker with 5" wheels;3in1 (PT)    Recommendations for Other Services       Precautions / Restrictions Precautions Precautions: Anterior Hip;Fall Precaution Comments: Pt has orders for both posterior and anterior hip precautions. Per op-note, pt will have anterior hip precautions. Discussed with pt.  Restrictions Weight Bearing Restrictions: Yes RLE Weight Bearing: Weight bearing as tolerated      Mobility  Bed Mobility Overal bed mobility: Needs Assistance Bed Mobility: Supine to Sit     Supine to sit: Mod assist;HOB elevated     General bed mobility comments: VC's for sequencing and technique. Assist for movement of bilateral LE's as pt very guarded with any movement. Mod assist to elevate trunk to full sitting position.   Transfers Overall transfer level: Needs assistance Equipment used: Rolling walker (2 wheeled) Transfers: Sit to/from [facility] Sit to Stand: Max assist;From elevated surface Stand pivot  transfers: Mod assist       General transfer comment: Max assist to achieve standing from elevated bed. Pt impulsive with leaning over onto RW and has difficulty maintaining upright posture due to pain. Pt required very specific cueing for sequencing to pivot around to the recliner, attempting to sit once prior to reaching the chair.   Ambulation/Gait                Stairs            Wheelchair Mobility    Modified Rankin (Stroke Patients Only)       Balance Overall balance assessment: History of Falls;Needs assistance Sitting-balance support: Feet supported;Bilateral upper extremity supported Sitting balance-Leahy Scale: Poor     Standing balance support: Bilateral upper extremity supported Standing balance-Leahy Scale: Poor                               Pertinent Vitals/Pain Vitals stable throughout session. Was on RA for most of session with sats remaining in the mid-high 90's.     Home Living Family/patient expects to be discharged to:: Skilled nursing facility Living Arrangements: Spouse/significant other                    Prior Function Level of Independence: Independent         Comments: Still driving, Doing same household activities that she has been for years.      Hand Dominance   Dominant Hand: Right    Extremity/Trunk Assessment   Upper Extremity Assessment: Defer to OT evaluation           Lower  Extremity Assessment: RLE deficits/detail RLE Deficits / Details: Decreased strength and AROM consistent with bipolar hip    Cervical / Trunk Assessment: Kyphotic  Communication   Communication: No difficulties  Cognition Arousal/Alertness: Awake/alert Behavior During Therapy: WFL for tasks assessed/performed Overall Cognitive Status: Within Functional Limits for tasks assessed                      General Comments      Exercises Total Joint Exercises Ankle Circles/Pumps: 10 reps Quad Sets: 10  reps      Assessment/Plan    PT Assessment Patient needs continued PT services  PT Diagnosis Difficulty walking;Acute pain   PT Problem List Decreased strength;Decreased range of motion;Decreased activity tolerance;Decreased balance;Decreased mobility;Decreased knowledge of use of DME;Decreased safety awareness;Decreased knowledge of precautions;Pain  PT Treatment Interventions DME instruction;Gait training;Stair training;Functional mobility training;Therapeutic activities;Therapeutic exercise;Neuromuscular re-education;Patient/family education   PT Goals (Current goals can be found in the Care Plan section) Acute Rehab PT Goals Patient Stated Goal: Decrease pain PT Goal Formulation: With patient Time For Goal Achievement: 04/23/14 Potential to Achieve Goals: Good    Frequency Min 3X/week   Barriers to discharge        Co-evaluation               End of Session Equipment Utilized During Treatment: Gait belt Activity Tolerance: Patient limited by pain Patient left: in chair;with chair alarm set;with call bell/phone within reach Nurse Communication: Mobility status         Time: 1109-1200 PT Time Calculation (min): 51 min   Charges:   PT Evaluation $Initial PT Evaluation Tier I: 1 Procedure PT Treatments $Therapeutic Activity: 38-52 mins   PT G CodesJolyn Lent 04/09/2014, 1:59 PM  Jolyn Lent, PT, DPT Acute Rehabilitation Services Pager: [phone]

## 2014-04-09 NOTE — Progress Notes (Signed)
CARE MANAGEMENT NOTE 04/09/2014  Patient:  ALESHKA, CORNEY   Account Number:  [id number]  Date Initiated:  04/09/2014  Documentation initiated by:  [facility]  Subjective/Objective Assessment:   admitted with rt femur fx, had rt hip hemiarthroplasty 04/08/14     Action/Plan:   PT/OT evals- recommended SNF   Anticipated DC Date:  04/10/2014   Anticipated DC Plan:  SKILLED NURSING FACILITY  In-house referral  Clinical Social Worker      DC Planning Services  CM consult      Choice offered to / List presented to:             Status of service:  In process, will continue to follow Medicare Important Message given?   (If response is "NO", the following Medicare IM given date fields will be blank) Date Medicare IM given:   Date Additional Medicare IM given:    Discharge Disposition:    Per UR Regulation:    If discussed at Long Length of Stay Meetings, dates discussed:    Comments:  04/09/14 PT eval recommending SNF. Referral made to CSW, patient and family agreeable with SNF. CM will continue to follow for d/c needs.Fuller Plan RN, BSn, CCM

## 2014-04-09 NOTE — Progress Notes (Signed)
I  Spoke with [employer] onsite reviewer on rehab venue options for pt. They will not approve an inpt rehab admission for this diagnosis at this time. I will alert RN CM. [phone]

## 2014-04-09 NOTE — Progress Notes (Signed)
INITIAL NUTRITION ASSESSMENT  DOCUMENTATION CODES Per approved criteria  -Not Applicable   INTERVENTION: Encouraged high protein intake to promote healing. Declined oral nutrition supplements. RD to continue to follow nutrition care plan.  NUTRITION DIAGNOSIS: Increased nutrient needs related to post-op healing as evidenced by estimated needs.   Goal: Intake to meet >90% of estimated nutrition needs.  Monitor:  weight trends, lab trends, I/O's, PO intake, supplement tolerance  Reason for Assessment: MD Consult for Nutrition Assessment  80 y.o. female  Admitting Dx: Fracture of femoral neck, right  ASSESSMENT: PMHx significant for HLD, HTN, osteoporosis, and afib. Admitted s/p fall. Work-up reveals displaced and angulated proximal right femoral neck fracture.  Underwent R bipolar arthroplasty on 6/28.  Currently ordered for a Regular diet. Family at bedside report that pt is eating about 40% of her meals. Pt states that her appetite is fair and she declines oral nutrition supplements. States that she is eating well. Discussed foods high in protein, such as meats, and she states that she didn't eat any of the chicken last night because it was "too heavy" and family states it was because she just got out of surgery. Encouraged intake as able.  Pt appears well-nourished.  Height: Ht Readings from Last 1 Encounters:  04/08/14 5\' 8"  (1.727 m)    Weight: Wt Readings from Last 1 Encounters:  04/08/14 167 lb (75.751 kg)    Ideal Body Weight: 140 lb  % Ideal Body Weight: 119%  Wt Readings from Last 10 Encounters:  04/08/14 167 lb (75.751 kg)  04/08/14 167 lb (75.751 kg)  01/08/14 165 lb (74.844 kg)    Usual Body Weight: 165 lb  % Usual Body Weight: 101%   BMI:  Body mass index is 25.4 kg/(m^2). Overweight  Estimated Nutritional Needs: Kcal: 1600 - 1800 Protein: 80 - 90 g Fluid: 1.8 - 2 liters  Skin: R hip incision  Diet Order: General  EDUCATION  NEEDS: -Education not appropriate at this time   Intake/Output Summary (Last 24 hours) at 04/09/14 1214 Last data filed at 04/09/14 1000  Gross per 24 hour  Intake   2365 ml  Output    360 ml  Net   2005 ml    Last BM: 6/26  Labs:   Recent Labs Lab 04/08/14 0121 04/09/14 0612  NA 143 140  K 4.3 4.4  CL 103 103  CO2 27 27  BUN 25* 20  CREATININE 0.77 0.90  CALCIUM 9.2 8.1*  GLUCOSE 115* 148*    CBG (last 3)  No results found for this basename: GLUCAP,  in the last 72 hours  Scheduled Meds: . docusate sodium  100 mg Oral BID  . loratadine  10 mg Oral Daily  . pantoprazole  40 mg Oral Q12H  . potassium chloride  10 mEq Oral BID  . pravastatin  40 mg Oral Daily  . sotalol  120 mg Oral BID  . sucralfate  1 g Oral TID WC & HS  . venlafaxine  75 mg Oral Daily  . [START ON 04/11/2014] Vitamin D (Ergocalciferol)  50,000 Units Oral Q Wed  . Warfarin - Pharmacist Dosing Inpatient   Does not apply q1800    Continuous Infusions:  none  Past Medical History  Diagnosis Date  . Hyperlipidemia   . Allergy   . Osteoporosis   . Hypertension   . A-fib     On chronic anticoagulation  . PONV (postoperative nausea and vomiting)   . Peripheral vascular disease   .  Pneumonia   . GERD (gastroesophageal reflux disease)   . Headache(784.0)   . Dysrhythmia     hx of atrial fibrilation    Past Surgical History  Procedure Laterality Date  . Cardiac electrophysiology mapping and ablation    . Bunionectomy    . Fracture surgery    . Hemiarthroplasty hip Right 04/08/2014    Inda Coke MS, RD, LDN Inpatient Registered Dietitian Pager: [phone] After-hours pager: [phone]

## 2014-04-09 NOTE — Progress Notes (Signed)
Addendum  Patient seen and examined, chart and data base reviewed.  I agree with the above assessment and plan.  For full details please see Mrs. Imogene Burn PA note.  Right hip fracture, status post right hemiarthroplasty by Dr. Ninfa Linden.  SNF upon discharge.   Birdie Hopes, MD [facility] Pager: [phone] 04/09/2014, 2:00 PM

## 2014-04-09 NOTE — Discharge Instructions (Signed)
Full weight bearing as tolerated right hip. Anterior hip precautions. Up only with a walker. Can get incision wet daily in the shower starting 04/13/14; then new dry dressing daily to protect the staples.

## 2014-04-09 NOTE — Progress Notes (Signed)
PROGRESS NOTE  [name] GGE:[id number] DOB: [birth year] DOA: 04/07/2014 PCP: Tivis Ringer, MD  HPI/Subjective: Pt reports only mild pain in R hip, good appetite and +BM since surgery. No additional complaints.  Family asks for patient to have continued IV dilaudid.  Unfortunately dilaudid dropped her SBP to 85.  I have placed hold parameters and encouraged the family to use oral narcotics rather than IV dilaudid.     Assessment/Plan: Right femoral neck fracture  Displaced and angulated proximal R femoral neck fracture, following mechanical fall in home Post-op day 1, R hip arthoplasty performed 6/28 by Dr. Ninfa Linden Per Ortho, resumed coumadin this am Pt up with therapy today, PT/OT evals scheduled Continue Oxycodone 5mg  prn pain.  Use minimal amount of dilaudid if SBP is > 120 CIR vs. SNF upon discharge - per CSW  Acute blood loss anemia Hgb 11.6 following R hip arthroplasty, baseline approx. 15.8 Pt asymptomatic, will continue to monitor CBC  Atrial fibrillation  Rate controlled, INR 2.1 this am, Resumed Coumadin per pharmacy  Hypertension  Continue Sotalol.   Patient had an episode of low BP 6/29 (SBP of 85).  Give 500 ml bolus and discouraged dilaudid.  Hyperlipidemia  Continue pravastatin  Osteoporosis  Hold bisphosphonates for now, continue vitamin D.     DVT Prophylaxis:  INR 2.1, resumed Coumadin   Code Status: Full code  Family Communication: Daughters and husband at bedside  Disposition Plan: Remain inpatient - CIR vs. SNF upon discharge on 6/30   Consultants:  Orthopedics - Dr. Ninfa Linden   Objective: Filed Vitals:   04/09/14 0800 04/09/14 1044 04/09/14 1139 04/09/14 1230  BP:  89/57  99/67  Pulse:  78  71  Temp:  97.5 F (36.4 C)    TempSrc:  Oral    Resp: 18  20   Height:      Weight:      SpO2: 94%  94%     Intake/Output Summary (Last 24 hours) at 04/09/14 1314 Last data filed at 04/09/14 1000  Gross per 24 hour  Intake    2365 ml  Output    360 ml  Net   2005 ml   Filed Weights   04/07/14 2339 04/08/14 0332  Weight: 75.751 kg (167 lb) 75.751 kg (167 lb)    Exam: General: Well developed, well nourished, NAD, appears stated age, pleasant HEENT:  PERR, EOMI, Anicteic Sclera, MMM. No pharyngeal erythema or exudates  Neck: Supple, no JVD, no masses  Cardiovascular: RRR, S1 S2, no m/g/r  Respiratory: Clear to auscultation bilaterally with equal chest rise  Abdomen: Soft, nontender, nondistended, +BS Extremities: warm dry without cyanosis clubbing or edema, incision on lateral aspect of R hip with clean dressing, no erythema or drainage Neuro: AAOx3, cranial nerves grossly intact Skin: Without rashes Psych: Normal affect and demeanor with intact judgement and insight   Data Reviewed: Basic Metabolic Panel:  Recent Labs Lab 04/08/14 0121 04/09/14 0612  NA 143 140  K 4.3 4.4  CL 103 103  CO2 27 27  GLUCOSE 115* 148*  BUN 25* 20  CREATININE 0.77 0.90  CALCIUM 9.2 8.1*   CBC:  Recent Labs Lab 04/08/14 0121 04/09/14 0612  WBC 7.4 6.0  NEUTROABS 5.7  --   HGB 13.5 11.6*  HCT 42.4 37.1  MCV 86.9 89.6  PLT 149* 124*    Recent Results (from the past 240 hour(s))  MRSA PCR SCREENING     Status: None   Collection Time  04/08/14 12:58 PM      Result Value Ref Range Status   MRSA by PCR NEGATIVE  NEGATIVE Final   Comment:            The GeneXpert MRSA Assay (FDA     approved for NASAL specimens     only), is one component of a     comprehensive MRSA colonization     surveillance program. It is not     intended to diagnose MRSA     infection nor to guide or     monitor treatment for     MRSA infections.     Studies: Dg Chest 1 View  04/08/2014   CLINICAL DATA:  Fall.  EXAM: CHEST - 1 VIEW  COMPARISON:  Chest radiograph October 09, 2010  FINDINGS: The cardiac silhouette remains mild to moderately enlarged. Tortuous, possibly ectatic calcified aortic is similar. Mild chronic  interstitial changes, elevated right hemidiaphragm. No pleural effusions or focal consolidations. No pneumothorax. Soft tissue planes are nonsuspicious. Moderate age indeterminate approximate T12 compression fracture appears new from prior chest radiograph.  IMPRESSION: Stable cardiomegaly, chronic interstitial changes.  Age indeterminate at least moderate T12 compression fracture, recommend correlation with point tenderness.   Electronically Signed   By: Elon Alas   On: 04/08/2014 01:15   Dg Hip Complete Right  04/08/2014   CLINICAL DATA:  Fall.  EXAM: RIGHT HIP - COMPLETE 2+ VIEW  COMPARISON:  None.  FINDINGS: Severely displaced and angulated fracture involving the proximal right femoral neck is noted. Femoral head is located within the acetabulum.  IMPRESSION: Displaced and angulated proximal right femoral neck fracture.   Electronically Signed   By: Sabino Dick M.D.   On: 04/08/2014 01:10   Dg Pelvis Portable  04/08/2014   CLINICAL DATA:  80 year old female status post surgery. Initial encounter.  EXAM: PORTABLE PELVIS 1-2 VIEWS  COMPARISON:  0017 hr the same day.  FINDINGS: Portable AP supine view at 1651 hrs. Sequelae of right proximal femur arthroplasty. Overlying soft tissue postoperative changes. Unchanged heterotopic ossification about the lateral proximal right femoral shaft, better demonstrated on the comparison and significance unclear. Hardware appears intact. Pelvis and left femur appear stable and intact.  IMPRESSION: 1. Right proximal femur arthroplasty with no adverse features. 2. Heterotopic bone versus periosteal reaction along the proximal third right femur shaft of unclear significance. Followup dedicated right femur series to fully visualize is recommended.   Electronically Signed   By: Lars Pinks M.D.   On: 04/08/2014 17:55    Scheduled Meds: . docusate sodium  100 mg Oral BID  . loratadine  10 mg Oral Daily  . pantoprazole  40 mg Oral Q12H  . potassium chloride  10 mEq  Oral BID  . pravastatin  40 mg Oral Daily  . sotalol  120 mg Oral BID  . sucralfate  1 g Oral TID WC & HS  . venlafaxine  75 mg Oral Daily  . [START ON 04/11/2014] Vitamin D (Ergocalciferol)  50,000 Units Oral Q Wed  . warfarin  2.5 mg Oral Once per day on Sun Mon Tue Thu Fri Sat   And  . [START ON 04/11/2014] warfarin  1.25 mg Oral Q Wed-1800  . Warfarin - Pharmacist Dosing Inpatient   Does not apply q1800   Continuous Infusions:    Principal Problem:   Fracture of femoral neck, right Active Problems:   Hip fracture   Hyperlipidemia   Osteoporosis   Hypertension   A-fib  Tammi Klippel, PA-S Imogene Burn, [place] [facility] Pager [phone]. If 7PM-7AM, please contact night-coverage at [email or web address], password [email or web address] 04/09/2014, 1:14 PM  LOS: 2 days

## 2014-04-09 NOTE — Progress Notes (Signed)
ANTICOAGULATION CONSULT NOTE - Follow Up Consult  Pharmacy Consult for Coumadin Indication: atrial fibrillation and VTE prophylaxis s/p ortho procedure  Allergies  Allergen Reactions  . Contrast Media [Iodinated Diagnostic Agents] Anaphylaxis  . Amiodarone     Pulmonary edema  . Flecainide     Parkinson like raction  . Latex Itching and Swelling  . Multaq [Dronedarone Hydrochloride] Swelling  . Sulfa Antibiotics Other (See Comments)    Makes her sicker & it causes her to have dark rings around her eyes    Patient Measurements: Height: 5\' 8"  (172.7 cm) Weight: 167 lb (75.751 kg) IBW/kg (Calculated) : 63.9  Vital Signs: Temp: 97.5 F (36.4 C) (06/29 1044) Temp src: Oral (06/29 1044) BP: 99/67 mmHg (06/29 1230) Pulse Rate: 71 (06/29 1230)  Labs:  Recent Labs  04/08/14 0121 04/09/14 0612  HGB 13.5 11.6*  HCT 42.4 37.1  PLT 149* 124*  APTT 27  --   LABPROT 20.8* 23.6*  INR 1.79* 2.10*  CREATININE 0.77 0.90    Estimated Creatinine Clearance: 46.9 ml/min (by C-G formula based on Cr of 0.9).   Medications:  Scheduled:  . docusate sodium  100 mg Oral BID  . loratadine  10 mg Oral Daily  . pantoprazole  40 mg Oral Q12H  . potassium chloride  10 mEq Oral BID  . pravastatin  40 mg Oral Daily  . sotalol  120 mg Oral BID  . sucralfate  1 g Oral TID WC & HS  . venlafaxine  75 mg Oral Daily  . [START ON 04/11/2014] Vitamin D (Ergocalciferol)  50,000 Units Oral Q Wed  . Warfarin - Pharmacist Dosing Inpatient   Does not apply q1800    Assessment: 80 yo F on Coumadin PTA for hx of afib.  Pt is POD#1 R THA.  INR today is therapeutic.  Will resume home Coumadin dose.   Goal of Therapy:  INR 2-3 Monitor platelets by anticoagulation protocol: Yes   Plan:  Coumadin 2.5mg  PO daily except 1.25mg  on Weds Continue daily INR.  [employer], Pharm.D., BCPS Clinical Pharmacist Pager [phone] 04/09/2014 1:00 PM

## 2014-04-09 NOTE — Progress Notes (Signed)
Utilization review completed.  

## 2014-04-10 ENCOUNTER — Encounter (HOSPITAL_COMMUNITY): Payer: Self-pay | Admitting: Orthopaedic Surgery

## 2014-04-10 ENCOUNTER — Inpatient Hospital Stay (HOSPITAL_COMMUNITY): Payer: Medicare Other

## 2014-04-10 DIAGNOSIS — J690 Pneumonitis due to inhalation of food and vomit: Secondary | ICD-10-CM

## 2014-04-10 LAB — BASIC METABOLIC PANEL WITH GFR
BUN: 19 mg/dL (ref 6–23)
CO2: 26 meq/L (ref 19–32)
Calcium: 8.4 mg/dL (ref 8.4–10.5)
Chloride: 101 meq/L (ref 96–112)
Creatinine, Ser: 0.8 mg/dL (ref 0.50–1.10)
GFR calc Af Amer: 76 mL/min — ABNORMAL LOW
GFR calc non Af Amer: 66 mL/min — ABNORMAL LOW
Glucose, Bld: 144 mg/dL — ABNORMAL HIGH (ref 70–99)
Potassium: 4.6 meq/L (ref 3.7–5.3)
Sodium: 137 meq/L (ref 137–147)

## 2014-04-10 LAB — PROTIME-INR
INR: 2.36 — ABNORMAL HIGH (ref 0.00–1.49)
PROTHROMBIN TIME: 25.8 s — AB (ref 11.6–15.2)

## 2014-04-10 LAB — URINE CULTURE
Colony Count: NO GROWTH
Culture: NO GROWTH

## 2014-04-10 LAB — CBC
HCT: 33.2 % — ABNORMAL LOW (ref 36.0–46.0)
Hemoglobin: 10.7 g/dL — ABNORMAL LOW (ref 12.0–15.0)
MCH: 28 pg (ref 26.0–34.0)
MCHC: 32.2 g/dL (ref 30.0–36.0)
MCV: 86.9 fL (ref 78.0–100.0)
Platelets: 120 10*3/uL — ABNORMAL LOW (ref 150–400)
RBC: 3.82 MIL/uL — ABNORMAL LOW (ref 3.87–5.11)
RDW: 15 % (ref 11.5–15.5)
WBC: 7.1 10*3/uL (ref 4.0–10.5)

## 2014-04-10 MED ORDER — SODIUM CHLORIDE 0.9 % IV SOLN
3.0000 g | Freq: Once | INTRAVENOUS | Status: AC
  Administered 2014-04-10: 3 g via INTRAVENOUS
  Filled 2014-04-10: qty 3

## 2014-04-10 MED ORDER — SODIUM CHLORIDE 0.9 % IV SOLN
3.0000 g | Freq: Four times a day (QID) | INTRAVENOUS | Status: DC
  Administered 2014-04-10 – 2014-04-11 (×3): 3 g via INTRAVENOUS
  Filled 2014-04-10 (×6): qty 3

## 2014-04-10 MED ORDER — MAGIC MOUTHWASH W/LIDOCAINE
10.0000 mL | Freq: Three times a day (TID) | ORAL | Status: DC
  Administered 2014-04-10 – 2014-04-11 (×4): 10 mL via ORAL
  Filled 2014-04-10 (×6): qty 10

## 2014-04-10 MED ORDER — MAGNESIUM HYDROXIDE 400 MG/5ML PO SUSP
15.0000 mL | Freq: Once | ORAL | Status: AC
  Administered 2014-04-10: 15 mL via ORAL
  Filled 2014-04-10: qty 30

## 2014-04-10 MED ORDER — SIMETHICONE 80 MG PO CHEW
160.0000 mg | CHEWABLE_TABLET | Freq: Three times a day (TID) | ORAL | Status: DC
  Administered 2014-04-10 – 2014-04-11 (×5): 160 mg via ORAL
  Filled 2014-04-10 (×9): qty 2

## 2014-04-10 NOTE — Progress Notes (Signed)
ANTICOAGULATION and ANTIBIOTIC CONSULT NOTE - Follow Up Consult  Pharmacy Consult for Coumadin, Unasyn Indication: atrial fibrillation and Aspiration pneumonia  Allergies  Allergen Reactions  . Contrast Media [Iodinated Diagnostic Agents] Anaphylaxis  . Amiodarone     Pulmonary edema  . Flecainide     Parkinson like raction  . Latex Itching and Swelling  . Multaq [Dronedarone Hydrochloride] Swelling  . Sulfa Antibiotics Other (See Comments)    Makes her sicker & it causes her to have dark rings around her eyes    Patient Measurements: Height: 5\' 8"  (172.7 cm) Weight: 167 lb (75.751 kg) IBW/kg (Calculated) : 63.9 Heparin Dosing Weight:   Vital Signs: Temp: 98.2 F (36.8 C) (06/30 0605) Temp src: Oral (06/30 0605) BP: 101/66 mmHg (06/30 0605) Pulse Rate: 75 (06/30 0605)  Labs:  Recent Labs  04/08/14 0121 04/09/14 0612 04/10/14 0504  HGB 13.5 11.6* 10.7*  HCT 42.4 37.1 33.2*  PLT 149* 124* 120*  APTT 27  --   --   LABPROT 20.8* 23.6* 25.8*  INR 1.79* 2.10* 2.36*  CREATININE 0.77 0.90 0.80    Estimated Creatinine Clearance: 52.8 ml/min (by C-G formula based on Cr of 0.8).   Medications:  Scheduled:  . ampicillin-sulbactam (UNASYN) IV  3 g Intravenous Once  . ampicillin-sulbactam (UNASYN) IV  3 g Intravenous Q6H  . docusate sodium  100 mg Oral BID  . loratadine  10 mg Oral Daily  . magic mouthwash w/lidocaine  10 mL Oral TID  . pantoprazole  40 mg Oral Q12H  . potassium chloride  10 mEq Oral BID  . pravastatin  40 mg Oral Daily  . simethicone  160 mg Oral TID AC & HS  . sotalol  120 mg Oral BID  . sucralfate  1 g Oral TID WC & HS  . venlafaxine  75 mg Oral Daily  . [START ON 04/11/2014] Vitamin D (Ergocalciferol)  50,000 Units Oral Q Wed  . warfarin  2.5 mg Oral Once per day on Sun Mon Tue Thu Fri Sat   And  . [START ON 04/11/2014] warfarin  1.25 mg Oral Q Wed-1800  . Warfarin - Pharmacist Dosing Inpatient   Does not apply q1800    Assessment: 80yo  female with AFib, s/p THA,  and now with symptoms of aspiration pneumonia with T 101.1 last PM and (+)cough.  She is currently on her home dose of Coumadin and to start Unasyn.  INR 2.36, Hg 10.7- trending down but no bleeding noted.  Goal of Therapy:  INR 2-3 Monitor platelets by anticoagulation protocol: Yes Resolution of infection   Plan:  1-  Continue home dose of Coumadin 2-  Unasyn 3g IV q6 3-  Trend renal fxn and fever curve 4-  Daily INR  Gracy Bruins, PharmD Clinical Pharmacist [facility]

## 2014-04-10 NOTE — Clinical Social Work Placement (Signed)
Clinical Social Work Department CLINICAL SOCIAL WORK PLACEMENT NOTE 04/10/2014  Patient:  [name], [name]  Account Number:  [id number] Admit date:  04/07/2014  Clinical Social Worker:  Carrington Clamp, LCSWA  Date/time:  04/08/2014 11:37 AM  Clinical Social Work is seeking post-discharge placement for this patient at the following level of care:   [place]   (*CSW will update this form in Epic as items are completed)   04/08/2014  Patient/family provided with [place] Department of Clinical Social Work's list of facilities offering this level of care within the geographic area requested by the patient (or if unable, by the patient's family).  04/08/2014  Patient/family informed of their freedom to choose among providers that offer the needed level of care, that participate in Medicare, Medicaid or managed care program needed by the patient, have an available bed and are willing to accept the patient.  04/08/2014  Patient/family informed of [facility]' ownership interest in [facility], as well as of the fact that they are under no obligation to receive care at this facility.  PASARR submitted to EDS on 04/08/2014 PASARR number received on 04/08/2014  FL2 transmitted to all facilities in geographic area requested by pt/family on  04/09/2014 FL2 transmitted to all facilities within larger geographic area on   Patient informed that his/her managed care company has contracts with or will negotiate with  certain facilities, including the following:     Patient/family informed of bed offers received:  04/09/2014 Patient chooses bed at [facility] Physician recommends and patient chooses bed at    Patient to be transferred to [place] on   Patient to be transferred to facility by  Patient and family notified of transfer on  Name of family member notified:    The following physician request were entered in Epic:   Additional Comments:    Liz Beach MSW, [place], [place], [phone]

## 2014-04-10 NOTE — Discharge Summary (Addendum)
Physician Discharge Summary  [name] QQI:[id number] DOB: 10/20/1927 DOA: 04/07/2014  PCP: Tivis Ringer, MD  Admit date: 04/07/2014 Discharge date: 04/11/2014  Time spent: 60 minutes  Recommendations for Outpatient Follow-up:  1. Continue po Augmentin 875-125mg  BID x 5 days for PNA 2. Recheck CBC to ensure Hgb returns to baseline (approx Hgb 15) 3. Recheck INR to ensure levels remain therapeutic 4. Discharged to [facility] 5. Discharged with rolling walker and PT services  Discharge Diagnoses:  Principal Problem:   Fracture of femoral neck, right Active Problems:   Hip fracture   Hyperlipidemia   Osteoporosis   Hypertension   A-fib   Aspiration pneumonia   Discharge Condition: Stable   Diet recommendation: General, heart healthy  Filed Weights   04/07/14 2339 04/08/14 0332  Weight: 75.751 kg (167 lb) 75.751 kg (167 lb)    History of present illness:  [name] is a 80 y.o. Caucasian female this history of hyperlipidemia, hypertension, osteoporosis, multiple allergies, and atrial fibrillation on chronic anticoagulation who presents with the above complaints. Pt reports that around 1020 p.m on 6/28, she bent down to pick up a paper clip. She lost balance and stumbled backwards few steps, and landed on her right hip and right side. After the fall, she had significant right hip pain. She denies hitting her head or losing consciousness. EMS was called to bring the patient to the emergency department for further evaluation.  In the ED, imaging showed displaced and angulated proximal right femoral neck fracture. Hospitalist service was asked to admit the patient for further care and management. She denies any recent fevers, chills, nausea, vomiting, chest pain, shortness of breath, abdominal pain, diarrhea, headaches or vision changes.  Hospital Course:  Right femoral neck fracture  Displaced and angulated proximal R femoral neck fracture, following mechanical fall in  home  R hip arthoplasty performed 6/28 by Dr. Ninfa Linden Per Ortho, resumed coumadin 6/29  PT/OT evals completed, discharge to SNF with rolling walker and continued PT services. Oxycodone 5mg  prn pain upon discharge  Pneumonia Likely aspiration PNA  Pt's daughter reported pt was coughing,  CXR showed slightly increased opacity medially at R lung bases, cannot exclude developing PNA.  Started on IV Unasyn, switched to po Augmentin this am Pt afebrile, minimal coughing, no leukocytosis Continue Augmentin 875-125mg  BID for 5 days after discharge.    Acute blood loss anemia  Hgb stable for past 3 days in mid-10s, baseline approx. 15.8  Pt remains asymptomatic, recheck CBC at follow-up with PCP  Atrial fibrillation  Rate controlled, INR 3.09 on 7/1 Resumed Coumadin per pharmacy, recheck INR at follow-up with PCP   Hypertension  Continue Sotalol.  Patient had an episode of low BP 6/29 (SBP of 85). Give 500 ml bolus and discouraged dilaudid. Now stable  Hyperlipidemia  Continue pravastatin   Osteoporosis  Continue vitamin D, Held bisphosphonates during admission, resume upon discharge    Discharge Exam: Filed Vitals:   04/11/14 0745  BP:   Pulse:   Temp:   Resp: 15    General: Well developed, well nourished, NAD, appears stated age 80: PERR, EOMI, Anicteic Sclera, MMM.  Neck: Supple, no JVD, no masses  Cardiovascular: RRR, S1 S2, no m/g/r, no wheezes or ronchi  Respiratory: CTAB with equal chest rise and normal respiratory effort Abdomen: Soft, nontender, nondistended, +BS  Extremities: warm dry without cyanosis clubbing or edema, incision on lateral aspect of R hip with clean dressing, no erythema, warmth, or drainage  Neuro:  AAOx3, strength 5/5 in upper extremities Skin: Without rashes or exudates Psych: Normal affect and demeanor with intact judgement and insight      Discharge Instructions   Call MD for:  persistant nausea and vomiting    Complete by:  As  directed      Call MD for:  redness, tenderness, or signs of infection (pain, swelling, redness, odor or green/yellow discharge around incision site)    Complete by:  As directed      Call MD for:  severe uncontrolled pain    Complete by:  As directed      Call MD for:  temperature >100.4    Complete by:  As directed      Diet - low sodium heart healthy    Complete by:  As directed      Diet - low sodium heart healthy    Complete by:  As directed      Increase activity slowly    Complete by:  As directed      Increase activity slowly    Complete by:  As directed             Medication List    STOP taking these medications       traMADol 50 MG tablet  Commonly known as:  ULTRAM      TAKE these medications       acetaminophen 500 MG tablet  Commonly known as:  TYLENOL  Take 500 mg by mouth every 6 (six) hours as needed. For pain     amoxicillin-clavulanate 875-125 MG per tablet  Commonly known as:  AUGMENTIN  Take 1 tablet by mouth every 12 (twelve) hours.     fexofenadine 180 MG tablet  Commonly known as:  ALLEGRA  Take 180 mg by mouth every morning.     HYDROcodone-acetaminophen 5-325 MG per tablet  Commonly known as:  NORCO/VICODIN  Take 1 tablet by mouth every 6 (six) hours as needed for moderate pain.     methocarbamol 500 MG tablet  Commonly known as:  ROBAXIN  Take 1 tablet (500 mg total) by mouth every 6 (six) hours as needed for muscle spasms.     pantoprazole 40 MG tablet  Commonly known as:  PROTONIX  Take 40 mg by mouth every 12 (twelve) hours.     polyethylene glycol packet  Commonly known as:  MIRALAX / GLYCOLAX  Take 17 g by mouth daily as needed for mild constipation.     potassium chloride 10 MEQ tablet  Commonly known as:  K-DUR  Take 10 mEq by mouth 2 (two) times daily.     pravastatin 40 MG tablet  Commonly known as:  PRAVACHOL  Take 40 mg by mouth at bedtime.     risedronate 35 MG tablet  Commonly known as:  ACTONEL  Take 35 mg by  mouth every 7 (seven) days. Sunday. with water on empty stomach, nothing by mouth or lie down for next 30 minutes.     saccharomyces boulardii 250 MG capsule  Commonly known as:  FLORASTOR  Take 1 capsule (250 mg total) by mouth 2 (two) times daily.     sotalol 120 MG tablet  Commonly known as:  BETAPACE  Take 120 mg by mouth 2 (two) times daily.     sucralfate 1 G tablet  Commonly known as:  CARAFATE  Take 1 g by mouth 4 (four) times daily -  with meals and at bedtime.     venlafaxine  75 MG tablet  Commonly known as:  EFFEXOR  Take 75 mg by mouth daily.     Vitamin D (Ergocalciferol) 50000 UNITS Caps capsule  Commonly known as:  DRISDOL  Take 50,000 Units by mouth every 7 (seven) days. Wednesday     warfarin 2.5 MG tablet  Commonly known as:  COUMADIN  Take 1.25-2.5 mg by mouth daily. 2.5mg  daily except 1.25mg  on Wednesdays       Allergies  Allergen Reactions  . Contrast Media [Iodinated Diagnostic Agents] Anaphylaxis  . Amiodarone     Pulmonary edema  . Flecainide     Parkinson like raction  . Latex Itching and Swelling  . Multaq [Dronedarone Hydrochloride] Swelling  . Sulfa Antibiotics Other (See Comments)    Makes her sicker & it causes her to have dark rings around her eyes   Follow-up Information   Follow up with Mcarthur Rossetti, MD. Schedule an appointment as soon as possible for a visit in 2 weeks.   Specialty:  Orthopedic Surgery   Contact information:   [place] [address] [phone]       Schedule an appointment as soon as possible for a visit with Tivis Ringer, MD. (1 week after discharge from SNF)    Specialty:  Internal Medicine   Contact information:   [facility] [place] [address] [phone]        The results of significant diagnostics from this hospitalization (including imaging, microbiology, ancillary and laboratory) are listed below for reference.     Significant Diagnostic Studies: Dg Chest 1 View  04/08/2014   CLINICAL DATA:  Fall.  EXAM: CHEST - 1 VIEW  COMPARISON:  Chest radiograph October 09, 2010  FINDINGS: The cardiac silhouette remains mild to moderately enlarged. Tortuous, possibly ectatic calcified aortic is similar. Mild chronic interstitial changes, elevated right hemidiaphragm. No pleural effusions or focal consolidations. No pneumothorax. Soft tissue planes are nonsuspicious. Moderate age indeterminate approximate T12 compression fracture appears new from prior chest radiograph.  IMPRESSION: Stable cardiomegaly, chronic interstitial changes.  Age indeterminate at least moderate T12 compression fracture, recommend correlation with point tenderness.   Electronically Signed   By: Elon Alas   On: 04/08/2014 01:15   Dg Hip Complete Right  04/08/2014   CLINICAL DATA:  Fall.  EXAM: RIGHT HIP - COMPLETE 2+ VIEW  COMPARISON:  None.  FINDINGS: Severely displaced and angulated fracture involving the proximal right femoral neck is noted. Femoral head is located within the acetabulum.  IMPRESSION: Displaced and angulated proximal right femoral neck fracture.   Electronically Signed   By: Sabino Dick M.D.   On: 04/08/2014 01:10   Dg Pelvis Portable  04/08/2014   CLINICAL DATA:  80 year old female status post surgery. Initial encounter.  EXAM: PORTABLE PELVIS 1-2 VIEWS  COMPARISON:  0017 hr the same day.  FINDINGS: Portable AP supine view at 1651 hrs. Sequelae of right proximal femur arthroplasty. Overlying soft tissue postoperative changes. Unchanged heterotopic ossification about the lateral proximal right femoral shaft, better demonstrated on the comparison and significance unclear. Hardware appears intact. Pelvis and left femur appear stable and intact.  IMPRESSION: 1. Right proximal femur arthroplasty with no adverse features. 2. Heterotopic bone versus periosteal reaction along the proximal third right femur shaft of unclear  significance. Followup dedicated right femur series to fully visualize is recommended.   Electronically Signed   By: Lars Pinks M.D.   On: 04/08/2014 17:55   Dg Chest Port 1v  Same Day  04/10/2014   CLINICAL DATA:  Cough  EXAM: PORTABLE CHEST - 1 VIEW SAME DAY  COMPARISON:  Chest x-ray of 04/08/2014 and 10/09/2010  FINDINGS: Minimally increased opacity is noted medially at right lung base and developing pneumonia cannot be excluded. Correlate clinically and consider follow-up. Otherwise the lungs are clear. No effusion is seen. Cardiomegaly is stable. The descending thoracic aorta remains ectatic. The questioned T12 compression deformity is not as well seen on the current view.  IMPRESSION: 1. Slight increased opacity medially at the right lung base. Recommend followup to exclude pneumonia. 2. Stable cardiomegaly.   Electronically Signed   By: Ivar Drape M.D.   On: 04/10/2014 08:20    Microbiology: Recent Results (from the past 240 hour(s))  URINE CULTURE     Status: None   Collection Time    04/08/14 11:10 AM      Result Value Ref Range Status   Specimen Description URINE, CLEAN CATCH   Final   Special Requests NONE   Final   Culture  Setup Time     Final   Value: 04/09/2014 00:12     Performed at [facility] Count     Final   Value: NO GROWTH     Performed at [facility]   Culture     Final   Value: NO GROWTH     Performed at [facility]   Report Status 04/10/2014 FINAL   Final  MRSA PCR SCREENING     Status: None   Collection Time    04/08/14 12:58 PM      Result Value Ref Range Status   MRSA by PCR NEGATIVE  NEGATIVE Final   Comment:            The GeneXpert MRSA Assay (FDA     approved for NASAL specimens     only), is one component of a     comprehensive MRSA colonization     surveillance program. It is not     intended to diagnose MRSA     infection nor to guide or     monitor treatment for     MRSA infections.     Labs: Basic  Metabolic Panel:  Recent Labs Lab 04/08/14 0121 04/09/14 0612 04/10/14 0504  NA 143 140 137  K 4.3 4.4 4.6  CL 103 103 101  CO2 27 27 26   GLUCOSE 115* 148* 144*  BUN 25* 20 19  CREATININE 0.77 0.90 0.80  CALCIUM 9.2 8.1* 8.4   CBC:  Recent Labs Lab 04/08/14 0121 04/09/14 0612 04/10/14 0504 04/11/14 0735  WBC 7.4 6.0 7.1 6.8  NEUTROABS 5.7  --   --   --   HGB 13.5 11.6* 10.7* 10.3*  HCT 42.4 37.1 33.2* 31.7*  MCV 86.9 89.6 86.9 86.6  PLT 149* 124* 120* 122*    Signed:  Tammi Klippel, PA-S  [facility] 04/11/2014, 12:50 PM  Attending Patient was seen, examined,treatment plan was discussed with the Physician extender. I have directly reviewed the clinical findings, lab, imaging studies and management of this patient in detail. I have made the necessary changes to the above noted documentation, and agree with the documentation, as recorded by the Physician extender.  Nena Alexander MD [facility].

## 2014-04-10 NOTE — Progress Notes (Signed)
PROGRESS NOTE  [name]:[id number] DOB: 1928-07-22 DOA: 04/07/2014 PCP: Tivis Ringer, MD  HPI/Subjective: Pt reports R hip pain is continuing to improve, +BM today. Pt's daughter noted that pt had been coughing overnight, ordered CXR which shows possible PNA.   Assessment/Plan: Right femoral neck fracture  Displaced and angulated proximal R femoral neck fracture, following mechanical fall in home  R hip arthoplasty performed 6/28 by Dr. Ninfa Linden  Post-op day 2, pt notes pain is improving  Per Ortho, resumed coumadin 6/29  Pt up with therapy 6/29, PT/OT evals completed Continue Oxycodone 5mg  prn pain, use minimal amount of Dilaudid if SBP >120 Likely discharge tomorrow to SNF with rolling walker and continued PT services  Pneumonia Likely aspiration PNA Pt's daughter reported pt was coughing,  CXR showed slightly increased opacity medially at R lung bases, cannot exclude developing PNA. Started IV Unasyn 3g q 6 hrs. Add Mucinex and Robitussin.  Acute blood loss anemia  Hgb 10.7 today, baseline approx. 15.8  Pt asymptomatic, will continue to monitor CBC  Atrial fibrillation  Rate controlled, resumed Coumadin per pharmacy 6/29 INR 2.6 this am,   Hypertension  Continue Sotalol.  Patient had an episode of low BP 6/29 (SBP of 85). Give 500 ml bolus and discouraged dilaudid. Episode of hypotension resolved.   Hyperlipidemia  Continue pravastatin   Osteoporosis  Continue vitamin D, hold bisphosphonates for now   DVT Prophylaxis: Coumadin per pharmacy, INR 2.6  Code Status:  Full code   Family Communication:  Daughters and husband at bedside   Disposition Plan:  Remain inpatient - SNF upon discharge on 7/1   Consultants:  Orthopedics - Dr. Ninfa Linden  Objective: Filed Vitals:   04/10/14 0211 04/10/14 0400 04/10/14 0605 04/10/14 0735  BP: 106/71  101/66   Pulse: 71  75   Temp: 97.8 F (36.6 C)  98.2 F (36.8 C)   TempSrc:   Oral   Resp: 16 17 17  16   Height:      Weight:      SpO2: 93% 97% 96% 96%    Intake/Output Summary (Last 24 hours) at 04/10/14 1007 Last data filed at 04/09/14 1800  Gross per 24 hour  Intake      0 ml  Output      0 ml  Net      0 ml   Filed Weights   04/07/14 2339 04/08/14 0332  Weight: 75.751 kg (167 lb) 75.751 kg (167 lb)    Exam: General: Well developed, well nourished, NAD, appears stated age, friendly HEENT: PERR, EOMI, Anicteic Sclera, MMM.  Neck: Supple, no JVD, no masses  Cardiovascular: RRR, S1 S2, no m/g/r  Respiratory: CTAB with equal chest rise  Abdomen: Soft, nontender, nondistended, +BS  Extremities: warm dry without cyanosis, incision on lateral aspect of R hip with clean dressing, no erythema, warmth, or drainage  Neuro: AAOx3, cranial nerves grossly intact  Skin: Without rashes, nodules or exudates Psych: Normal affect and demeanor with intact judgement and insight   Data Reviewed: Basic Metabolic Panel:  Recent Labs Lab 04/08/14 0121 04/09/14 0612 04/10/14 0504  NA 143 140 137  K 4.3 4.4 4.6  CL 103 103 101  CO2 27 27 26   GLUCOSE 115* 148* 144*  BUN 25* 20 19  CREATININE 0.77 0.90 0.80  CALCIUM 9.2 8.1* 8.4   CBC:  Recent Labs Lab 04/08/14 0121 04/09/14 0612 04/10/14 0504  WBC 7.4 6.0 7.1  NEUTROABS 5.7  --   --  HGB 13.5 11.6* 10.7*  HCT 42.4 37.1 33.2*  MCV 86.9 89.6 86.9  PLT 149* 124* 120*    Recent Results (from the past 240 hour(s))  URINE CULTURE     Status: None   Collection Time    04/08/14 11:10 AM      Result Value Ref Range Status   Specimen Description URINE, CLEAN CATCH   Final   Special Requests NONE   Final   Culture  Setup Time     Final   Value: 04/09/2014 00:12     Performed at [facility] Count     Final   Value: NO GROWTH     Performed at [facility]   Culture     Final   Value: NO GROWTH     Performed at [facility]   Report Status 04/10/2014 FINAL   Final  MRSA PCR SCREENING      Status: None   Collection Time    04/08/14 12:58 PM      Result Value Ref Range Status   MRSA by PCR NEGATIVE  NEGATIVE Final   Comment:            The GeneXpert MRSA Assay (FDA     approved for NASAL specimens     only), is one component of a     comprehensive MRSA colonization     surveillance program. It is not     intended to diagnose MRSA     infection nor to guide or     monitor treatment for     MRSA infections.     Studies: Dg Pelvis Portable  04/08/2014   CLINICAL DATA:  80 year old female status post surgery. Initial encounter.  EXAM: PORTABLE PELVIS 1-2 VIEWS  COMPARISON:  0017 hr the same day.  FINDINGS: Portable AP supine view at 1651 hrs. Sequelae of right proximal femur arthroplasty. Overlying soft tissue postoperative changes. Unchanged heterotopic ossification about the lateral proximal right femoral shaft, better demonstrated on the comparison and significance unclear. Hardware appears intact. Pelvis and left femur appear stable and intact.  IMPRESSION: 1. Right proximal femur arthroplasty with no adverse features. 2. Heterotopic bone versus periosteal reaction along the proximal third right femur shaft of unclear significance. Followup dedicated right femur series to fully visualize is recommended.   Electronically Signed   By: Lars Pinks M.D.   On: 04/08/2014 17:55   Dg Chest Port 1v Same Day  04/10/2014   CLINICAL DATA:  Cough  EXAM: PORTABLE CHEST - 1 VIEW SAME DAY  COMPARISON:  Chest x-ray of 04/08/2014 and 10/09/2010  FINDINGS: Minimally increased opacity is noted medially at right lung base and developing pneumonia cannot be excluded. Correlate clinically and consider follow-up. Otherwise the lungs are clear. No effusion is seen. Cardiomegaly is stable. The descending thoracic aorta remains ectatic. The questioned T12 compression deformity is not as well seen on the current view.  IMPRESSION: 1. Slight increased opacity medially at the right lung base. Recommend followup  to exclude pneumonia. 2. Stable cardiomegaly.   Electronically Signed   By: Ivar Drape M.D.   On: 04/10/2014 08:20    Scheduled Meds: . ampicillin-sulbactam (UNASYN) IV  3 g Intravenous Once  . docusate sodium  100 mg Oral BID  . loratadine  10 mg Oral Daily  . magic mouthwash w/lidocaine  10 mL Oral TID  . pantoprazole  40 mg Oral Q12H  . potassium chloride  10 mEq Oral BID  .  pravastatin  40 mg Oral Daily  . simethicone  160 mg Oral TID AC & HS  . sotalol  120 mg Oral BID  . sucralfate  1 g Oral TID WC & HS  . venlafaxine  75 mg Oral Daily  . [START ON 04/11/2014] Vitamin D (Ergocalciferol)  50,000 Units Oral Q Wed  . warfarin  2.5 mg Oral Once per day on Sun Mon Tue Thu Fri Sat   And  . [START ON 04/11/2014] warfarin  1.25 mg Oral Q Wed-1800  . Warfarin - Pharmacist Dosing Inpatient   Does not apply q1800   Continuous Infusions:   Principal Problem:   Fracture of femoral neck, right Active Problems:   Hip fracture   Hyperlipidemia   Osteoporosis   Hypertension   A-fib  Tammi Klippel, PA-S   [facility] Pager [phone]. If 7PM-7AM, please contact night-coverage at [email or web address], password [email or web address] 04/10/2014, 10:07 AM  LOS: 3 days   Addendum  Patient seen and examined, chart and data base reviewed.  I agree with the above assessment and plan.  For full details please see Mrs. Tammi Klippel, PA-S note.  I reviewed and amended the above note as needed.   Birdie Hopes, MD [facility] Pager: [phone] 04/10/2014, 11:38 AM

## 2014-04-10 NOTE — Progress Notes (Signed)
Physical Therapy Treatment Patient Details Name: [name] MRN: [id number] DOB: 05/14/1928 Today's Date: 04/10/2014    History of Present Illness Pt is an 80 y/o female presenting with R femur fracture after mechanical fall at home. Pt is now s/p R bipolar hip arthroplasty with ant-lat hip precautions per op-note.    PT Comments    Patient anxious today due to not having a BM since admission. Patient's mobility is improving as pt tolerated short distance gait training with increased cues and assist. Fatigues quickly with exercise and is fearful of falling. Continues to exhibit increased pain and balance deficits putting pt at increased risk for falls. Pt would benefit from skilled therapy in acute care setting to maximize independence and ease burden of care.   Follow Up Recommendations  SNF;Supervision/Assistance - 24 hour     Equipment Recommendations  Rolling walker with 5" wheels;3in1 (PT)    Recommendations for Other Services       Precautions / Restrictions Precautions Precautions: Anterior Hip;Fall Precaution Comments: anterolateral hip precautions per op note Restrictions Weight Bearing Restrictions: No RLE Weight Bearing: Weight bearing as tolerated    Mobility  Bed Mobility Overal bed mobility: Needs Assistance Bed Mobility: Supine to Sit     Supine to sit: Mod assist;HOB elevated     General bed mobility comments: VC for sequencing and technique. Increased time. Assist to mobilize RLE, trunk and bottom to EOB.  Transfers Overall transfer level: Needs assistance Equipment used: Rolling walker (2 wheeled) Transfers: Sit to/from [facility] Sit to Stand: Min assist Stand pivot transfers: Min assist       General transfer comment: Min A to stand from [facility] utilizing arm rests, VC for positioning of BLEs and trunk  Ambulation/Gait Ambulation/Gait assistance: Min assist Ambulation Distance (Feet): 3 Feet Assistive device: Rolling walker (2  wheeled) Gait Pattern/deviations: Step-to pattern;Decreased stance time - right;Decreased weight shift to right;Trunk flexed   Gait velocity interpretation: Below normal speed for age/gender General Gait Details: Difficulty advancing LLE secondary to pain with WB through RLE. Cues to push through BUEs to relieve pressure on LEs and assist with advancement of LLE. Assist with walker management. Pt anxious.   Stairs            Wheelchair Mobility    Modified Rankin (Stroke Patients Only)       Balance Overall balance assessment: Needs assistance Sitting-balance support: Feet supported;Bilateral upper extremity supported Sitting balance-Leahy Scale: Poor     Standing balance support: Bilateral upper extremity supported Standing balance-Leahy Scale: Poor Standing balance comment: Unsteady upon standing secondary to anxiety, weakness in BLEs and pain.                    Cognition Arousal/Alertness: Awake/alert Behavior During Therapy: WFL for tasks assessed/performed Overall Cognitive Status: Within Functional Limits for tasks assessed       Memory: Decreased recall of precautions (Not able to recall hip precautions without cues.)              Exercises Total Joint Exercises Ankle Circles/Pumps: 20 reps;Both;Seated Quad Sets: 15 reps;Both;Seated    General Comments General comments (skin integrity, edema, etc.): Mild edema present RLE.      Pertinent Vitals/Pain Sp02 decreased to 85% on RA post treatment session. Mild SOB present. Donned 2L 02 [place] and Sp02 increased to 95% at rest. HR stable.    Home Living  Prior Function            PT Goals (current goals can now be found in the care plan section) Progress towards PT goals: Progressing toward goals    Frequency  Min 3X/week    PT Plan Current plan remains appropriate    Co-evaluation             End of Session Equipment Utilized During Treatment: Gait  belt Activity Tolerance: Patient tolerated treatment well Patient left: in chair;with call bell/phone within reach;with chair alarm set;with family/visitor present     Time: 1400-1440 PT Time Calculation (min): 40 min  Charges:  $Gait Training: 8-22 mins $Therapeutic Activity: 8-22 mins $Self Care/Home Management: 8-22                    G CodesCandy Lutz A 05/01/14, 2:59 PM [name], [place], DPT [phone]

## 2014-04-11 LAB — CBC
HCT: 31.7 % — ABNORMAL LOW (ref 36.0–46.0)
HEMOGLOBIN: 10.3 g/dL — AB (ref 12.0–15.0)
MCH: 28.1 pg (ref 26.0–34.0)
MCHC: 32.5 g/dL (ref 30.0–36.0)
MCV: 86.6 fL (ref 78.0–100.0)
Platelets: 122 10*3/uL — ABNORMAL LOW (ref 150–400)
RBC: 3.66 MIL/uL — ABNORMAL LOW (ref 3.87–5.11)
RDW: 14.7 % (ref 11.5–15.5)
WBC: 6.8 10*3/uL (ref 4.0–10.5)

## 2014-04-11 LAB — PROTIME-INR
INR: 3.09 — ABNORMAL HIGH (ref 0.00–1.49)
PROTHROMBIN TIME: 31.9 s — AB (ref 11.6–15.2)

## 2014-04-11 MED ORDER — METHOCARBAMOL 500 MG PO TABS: 500.0000 mg | ORAL_TABLET | Freq: Four times a day (QID) | ORAL | Status: DC | PRN

## 2014-04-11 MED ORDER — HYDROCODONE-ACETAMINOPHEN 5-325 MG PO TABS: 1.0000 | ORAL_TABLET | Freq: Four times a day (QID) | ORAL | Status: DC | PRN

## 2014-04-11 MED ORDER — POLYETHYLENE GLYCOL 3350 17 G PO PACK: 17.0000 g | PACK | Freq: Every day | ORAL | Status: DC | PRN

## 2014-04-11 MED ORDER — AMOXICILLIN-POT CLAVULANATE 875-125 MG PO TABS
1.0000 | ORAL_TABLET | Freq: Two times a day (BID) | ORAL | Status: DC
  Administered 2014-04-11: 1 via ORAL
  Filled 2014-04-11 (×2): qty 1

## 2014-04-11 MED ORDER — FEXOFENADINE HCL 30 MG PO TABS: 30.0000 mg | ORAL_TABLET | Freq: Two times a day (BID) | ORAL | Status: DC | PRN

## 2014-04-11 MED ORDER — AMOXICILLIN-POT CLAVULANATE 875-125 MG PO TABS: 1.0000 | ORAL_TABLET | Freq: Two times a day (BID) | ORAL | Status: DC

## 2014-04-11 MED ORDER — BACLOFEN 5 MG HALF TABLET
5.0000 mg | ORAL_TABLET | Freq: Three times a day (TID) | ORAL | Status: DC | PRN
  Filled 2014-04-11: qty 1

## 2014-04-11 MED ORDER — SACCHAROMYCES BOULARDII 250 MG PO CAPS: 250.0000 mg | ORAL_CAPSULE | Freq: Two times a day (BID) | ORAL | Status: DC

## 2014-04-11 NOTE — Progress Notes (Signed)
Physical Therapy Treatment Patient Details Name: [name] MRN: [id number] DOB: 02-05-1928 Today's Date: 04/11/2014    History of Present Illness Pt is an 80 y/o female presenting with R femur fracture after mechanical fall at home. Pt is now s/p R bipolar hip arthroplasty with ant-lat hip precautions per op-note.    PT Comments    Patient less anxious today and more willing to participate in therapy. Pt increased ambulation distance today but continues to exhibit balance deficits. Sp02 maintained >90% during activity on RA, improved from yesterday's session. Pt would continue to benefit from skilled therapy to maximize independence.    Follow Up Recommendations  SNF;Supervision/Assistance - 24 hour     Equipment Recommendations  Rolling walker with 5" wheels;3in1 (PT)    Recommendations for Other Services       Precautions / Restrictions Precautions Precautions: Anterior Hip;Fall Precaution Comments: anterolateral hip precautions per op note Restrictions RLE Weight Bearing: Weight bearing as tolerated    Mobility  Bed Mobility               General bed mobility comments: Sitting in chair upon PT arrival.  Transfers Overall transfer level: Needs assistance Equipment used: Rolling walker (2 wheeled) Transfers: Sit to/from Stand Sit to Stand: Min assist;Min guard         General transfer comment: Initially required Min A on first bout to stand from recliner with VCs for foot/hand placement and technique progressing to Min guard assist for other 2 bouts of standing.  Ambulation/Gait Ambulation/Gait assistance: Min guard Ambulation Distance (Feet): 12 Feet (x3 bouts with seated rest breaks in between.) Assistive device: Rolling walker (2 wheeled) Gait Pattern/deviations: Step-to pattern;Decreased stance time - right;Decreased step length - left   Gait velocity interpretation: Below normal speed for age/gender General Gait Details: Less anxious today during  ambulation. VCs to push through BUEs during stance phase of gait on R to relieve pain.   Stairs            Wheelchair Mobility    Modified Rankin (Stroke Patients Only)       Balance Overall balance assessment: Needs assistance Sitting-balance support: Feet supported;No upper extremity supported Sitting balance-Leahy Scale: Poor Sitting balance - Comments: Not able to maintain sitting balance for long periods without back rest secondary to discomfort/fatigue.   Standing balance support: Bilateral upper extremity supported;During functional activity Standing balance-Leahy Scale: Poor Standing balance comment: Unsteady upon standing initially when transferring hands from arm rests of chair to walker handles.                    Cognition Arousal/Alertness: Awake/alert Behavior During Therapy: WFL for tasks assessed/performed Overall Cognitive Status: Within Functional Limits for tasks assessed       Memory: Decreased recall of precautions              Exercises Total Joint Exercises Ankle Circles/Pumps: 20 reps;Both;Seated Quad Sets: 10 reps;Right;Seated Long Arc Quad: Right;15 reps;Seated    General Comments General comments (skin integrity, edema, etc.): Edema improved from previous session.      Pertinent Vitals/Pain 5/10 pain in R hip. Pt declined taking pain medication prior to tx.    Home Living                      Prior Function            PT Goals (current goals can now be found in the care plan section) Progress towards PT goals:  Progressing toward goals    Frequency  Min 3X/week    PT Plan Current plan remains appropriate    Co-evaluation             End of Session Equipment Utilized During Treatment: Gait belt Activity Tolerance: Patient tolerated treatment well Patient left: in chair;with call bell/phone within reach;with chair alarm set;with nursing/sitter in room;with family/visitor present     Time:  1006-1036 PT Time Calculation (min): 30 min  Charges:  $Gait Training: 8-22 mins $Therapeutic Exercise: 8-22 mins                    G CodesCandy Lutz A 04/30/14, 10:44 AM [name], [place], DPT [phone]

## 2014-04-11 NOTE — Clinical Social Work Placement (Signed)
Clinical Social Work Department CLINICAL SOCIAL WORK PLACEMENT NOTE 04/11/2014  Patient:  [name], [name]  Account Number:  [id number] Admit date:  04/07/2014  Clinical Social Worker:  Carrington Clamp, LCSWA  Date/time:  04/08/2014 11:37 AM  Clinical Social Work is seeking post-discharge placement for this patient at the following level of care:   [place]   (*CSW will update this form in Epic as items are completed)   04/08/2014  Patient/family provided with [place] Department of Clinical Social Work's list of facilities offering this level of care within the geographic area requested by the patient (or if unable, by the patient's family).  04/08/2014  Patient/family informed of their freedom to choose among providers that offer the needed level of care, that participate in Medicare, Medicaid or managed care program needed by the patient, have an available bed and are willing to accept the patient.  04/08/2014  Patient/family informed of [facility]' ownership interest in [facility], as well as of the fact that they are under no obligation to receive care at this facility.  PASARR submitted to EDS on 04/08/2014 PASARR number received on 04/08/2014  FL2 transmitted to all facilities in geographic area requested by pt/family on  04/09/2014 FL2 transmitted to all facilities within larger geographic area on   Patient informed that his/her managed care company has contracts with or will negotiate with  certain facilities, including the following:     Patient/family informed of bed offers received:  04/09/2014 Patient chooses bed at [place] Physician recommends and patient chooses bed at    Patient to be transferred to [place] on  04/11/2014 Patient to be transferred to facility by Ambulance Patient and family notified of transfer on 04/11/2014 Name of family member notified:  Husband at bedside  The following physician request were entered in  Epic:   Additional Comments: Per MD patient ready for DC to SNF. DC pack on chart. RN given number for report. Ambulance transport requested for 2PM. CSW signing off at this time.    Liz Beach MSW, [place], [place], [phone]

## 2014-04-11 NOTE — Progress Notes (Signed)
ANTICOAGULATION CONSULT NOTE - Follow Up Consult  Pharmacy Consult:  Coumadin Indication: atrial fibrillation  Allergies  Allergen Reactions  . Contrast Media [Iodinated Diagnostic Agents] Anaphylaxis  . Amiodarone     Pulmonary edema  . Flecainide     Parkinson like raction  . Latex Itching and Swelling  . Multaq [Dronedarone Hydrochloride] Swelling  . Sulfa Antibiotics Other (See Comments)    Makes her sicker & it causes her to have dark rings around her eyes    Patient Measurements: Height: 5\' 8"  (172.7 cm) Weight: 167 lb (75.751 kg) IBW/kg (Calculated) : 63.9  Vital Signs: Temp: 99.3 F (37.4 C) (07/01 0555) Temp src: Oral (07/01 0555) BP: 129/85 mmHg (07/01 0555) Pulse Rate: 88 (07/01 0555)  Labs:  Recent Labs  04/09/14 0612 04/10/14 0504  HGB 11.6* 10.7*  HCT 37.1 33.2*  PLT 124* 120*  LABPROT 23.6* 25.8*  INR 2.10* 2.36*  CREATININE 0.90 0.80    Estimated Creatinine Clearance: 52.8 ml/min (by C-G formula based on Cr of 0.8).     Assessment: 70 YOF s/p THA to continue on Coumadin for Afib and VTE prophylaxis.  INR slightly supra-therapeutic.  No bleeding reported.  Noted patient has mild anemia and thrombocytopenia.   Goal of Therapy:  INR 2-3    Plan:  - Continue Coumadin 2.5mg  PO daily except 1.25mg  on Wed - Daily PT / INR    Kolten Ryback D. Mina Marble, PharmD, BCPS Pager:  [phone] 04/11/2014, 9:46 AM

## 2014-04-11 NOTE — Progress Notes (Signed)
CARE MANAGEMENT NOTE 04/11/2014  Patient:  YOCELINE, BAZAR   Account Number:  [id number]  Date Initiated:  04/09/2014  Documentation initiated by:  [facility]  Subjective/Objective Assessment:   admitted with rt femur fx, had rt hip hemiarthroplasty 04/08/14     Action/Plan:   PT/OT evals- recommended SNF   Anticipated DC Date:  04/11/2014   Anticipated DC Plan:  SKILLED NURSING FACILITY  In-house referral  Clinical Social Worker      DC Planning Services  CM consult      Choice offered to / List presented to:             Status of service:  Completed, signed off Medicare Important Message given?  YES (If response is "NO", the following Medicare IM given date fields will be blank) Date Medicare IM given:  04/11/2014 Medicare IM given by:  [facility] Date Additional Medicare IM given:   Additional Medicare IM given by:    Discharge Disposition:    Per UR Regulation:    If discussed at Long Length of Stay Meetings, dates discussed:    Comments:  04/09/14 PT eval recommending SNF. Referral made to CSW, patient and family agreeable with SNF. CM will continue to follow for d/c needs.Fuller Plan RN, BSn, CCM

## 2014-04-11 NOTE — Progress Notes (Signed)
Subjective: 3 Days Post-Op Procedure(s) (LRB): ARTHROPLASTY BIPOLAR HIP (Right) Patient reports pain as moderate.  Working slowly with therapy on mobility.  Objective: Vital signs in last 24 hours: Temp:  [98.9 F (37.2 C)-100.6 F (38.1 C)] 99.3 F (37.4 C) (07/01 0555) Pulse Rate:  [73-88] 88 (07/01 0555) Resp:  [15-18] 17 (07/01 0555) BP: (115-135)/(74-85) 129/85 mmHg (07/01 0555) SpO2:  [94 %-96 %] 94 % (07/01 0555)  Intake/Output from previous day: 06/30 0701 - 07/01 0700 In: 420 [P.O.:220; IV Piggyback:200] Out: -  Intake/Output this shift:     Recent Labs  04/09/14 0612 04/10/14 0504  HGB 11.6* 10.7*    Recent Labs  04/09/14 0612 04/10/14 0504  WBC 6.0 7.1  RBC 4.14 3.82*  HCT 37.1 33.2*  PLT 124* 120*    Recent Labs  04/09/14 0612 04/10/14 0504  NA 140 137  K 4.4 4.6  CL 103 101  CO2 27 26  BUN 20 19  CREATININE 0.90 0.80  GLUCOSE 148* 144*  CALCIUM 8.1* 8.4    Recent Labs  04/09/14 0612 04/10/14 0504  INR 2.10* 2.36*    Sensation intact distally Intact pulses distally Dorsiflexion/Plantar flexion intact Incision: scant drainage  Assessment/Plan: 3 Days Post-Op Procedure(s) (LRB): ARTHROPLASTY BIPOLAR HIP (Right) Up with therapy Discharge to SNF  [facility] 04/11/2014, 7:27 AM

## 2014-04-12 ENCOUNTER — Non-Acute Institutional Stay (SKILLED_NURSING_FACILITY): Payer: Medicare Other | Admitting: Adult Health

## 2014-04-12 ENCOUNTER — Encounter: Payer: Self-pay | Admitting: Adult Health

## 2014-04-12 DIAGNOSIS — J3089 Other allergic rhinitis: Secondary | ICD-10-CM

## 2014-04-12 DIAGNOSIS — S72001S Fracture of unspecified part of neck of right femur, sequela: Secondary | ICD-10-CM

## 2014-04-12 DIAGNOSIS — I4891 Unspecified atrial fibrillation: Secondary | ICD-10-CM

## 2014-04-12 DIAGNOSIS — F329 Major depressive disorder, single episode, unspecified: Secondary | ICD-10-CM | POA: Insufficient documentation

## 2014-04-12 DIAGNOSIS — I10 Essential (primary) hypertension: Secondary | ICD-10-CM

## 2014-04-12 DIAGNOSIS — F32A Depression, unspecified: Secondary | ICD-10-CM | POA: Insufficient documentation

## 2014-04-12 DIAGNOSIS — E785 Hyperlipidemia, unspecified: Secondary | ICD-10-CM

## 2014-04-12 DIAGNOSIS — J309 Allergic rhinitis, unspecified: Secondary | ICD-10-CM | POA: Insufficient documentation

## 2014-04-12 DIAGNOSIS — J690 Pneumonitis due to inhalation of food and vomit: Secondary | ICD-10-CM

## 2014-04-12 DIAGNOSIS — D62 Acute posthemorrhagic anemia: Secondary | ICD-10-CM | POA: Insufficient documentation

## 2014-04-12 DIAGNOSIS — M810 Age-related osteoporosis without current pathological fracture: Secondary | ICD-10-CM

## 2014-04-12 DIAGNOSIS — S72009S Fracture of unspecified part of neck of unspecified femur, sequela: Secondary | ICD-10-CM

## 2014-04-12 DIAGNOSIS — F3289 Other specified depressive episodes: Secondary | ICD-10-CM

## 2014-04-12 NOTE — Progress Notes (Signed)
Patient ID: [name], female   DOB: 1928/08/30, 80 y.o.   MRN: [id number]               PROGRESS NOTE  DATE: 04/12/2014  FACILITY: Nursing Home Location: [facility]  LEVEL OF CARE: SNF (31)  Acute Visit  CHIEF COMPLAINT:  Follow-up Hospitalization  HISTORY OF PRESENT ILLNESS: This is an 80 year old female who has been admitted to [facility] on 04/11/14 from [facility]. She fell @ home and sustained a right femoral neck fracture S/P right hip arthroplasty. She has been admitted for a short-term rehabilitation.  REASSESSMENT OF ONGOING PROBLEM(S):  HTN: Pt 's HTN remains stable.  Denies CP, sob, DOE, pedal edema, headaches, dizziness or visual disturbances.  No complications from the medications currently being used.  Last BP : 131/79  ATRIAL FIBRILLATION: the patients atrial fibrillation remains stable.  The patient denies DOE, tachycardia, orthopnea, transient neurological sx, pedal edema, palpitations, & PNDs.  No complications noted from the medications currently being used.  DEPRESSION: The depression remains stable. Patient denies ongoing feelings of sadness, insomnia, anedhonia or lack of appetite. No complications reported from the medications currently being used. Staff do not report behavioral problems.   PAST MEDICAL HISTORY : Reviewed.  No changes/see problem list  CURRENT MEDICATIONS: Reviewed per MAR/see medication list  REVIEW OF SYSTEMS:  GENERAL: no change in appetite, no fatigue, no weight changes, no fever, chills or weakness RESPIRATORY: no cough, SOB, DOE, wheezing, hemoptysis CARDIAC: no chest pain, edema or palpitations GI: no abdominal pain, diarrhea, constipation, heart burn, nausea or vomiting  PHYSICAL EXAMINATION  GENERAL: no acute distress, normal body habitus EYES: conjunctivae normal, sclerae normal, normal eye lids MOUTH:  Tongue has cheesy grayish plaques NECK: supple, trachea midline, no neck masses, no thyroid  tenderness, no thyromegaly LYMPHATICS: no LAN in the neck, no supraclavicular LAN RESPIRATORY: breathing is even & unlabored, BS CTAB CARDIAC: RRR, no murmur,no extra heart sounds, no edema GI: abdomen soft, normal BS, no masses, no tenderness, no hepatomegaly, no splenomegaly EXTREMITIES:  Able to move all 4 extremities; limited ROM on RLE PSYCHIATRIC: the patient is alert & oriented to person, affect & behavior appropriate  LABS/RADIOLOGY: Labs reviewed: Basic Metabolic Panel:  Recent Labs  04/08/14 0121 04/09/14 0612 04/10/14 0504  NA 143 140 137  K 4.3 4.4 4.6  CL 103 103 101  CO2 27 27 26   GLUCOSE 115* 148* 144*  BUN 25* 20 19  CREATININE 0.77 0.90 0.80  CALCIUM 9.2 8.1* 8.4   CBC:  Recent Labs  04/08/14 0121 04/09/14 0612 04/10/14 0504 04/11/14 0735  WBC 7.4 6.0 7.1 6.8  NEUTROABS 5.7  --   --   --   HGB 13.5 11.6* 10.7* 10.3*  HCT 42.4 37.1 33.2* 31.7*  MCV 86.9 89.6 86.9 86.6  PLT 149* 124* 120* 122*   CLINICAL DATA:  Fall.   EXAM: CHEST - 1 VIEW   COMPARISON:  Chest radiograph October 09, 2010   FINDINGS: The cardiac silhouette remains mild to moderately enlarged. Tortuous, possibly ectatic calcified aortic is similar. Mild chronic interstitial changes, elevated right hemidiaphragm. No pleural effusions or focal consolidations. No pneumothorax. Soft tissue planes are nonsuspicious. Moderate age indeterminate approximate T12 compression fracture appears new from prior chest radiograph.   IMPRESSION: Stable cardiomegaly, chronic interstitial changes.   Age indeterminate at least moderate T12 compression fracture, recommend correlation with point tenderness.   CLINICAL DATA:  80 year old female status post surgery. Initial  encounter.   EXAM: PORTABLE PELVIS 1-2 VIEWS   COMPARISON:  0017 hr the same day.   FINDINGS: Portable AP supine view at 1651 hrs. Sequelae of right proximal femur arthroplasty. Overlying soft tissue postoperative  changes. Unchanged heterotopic ossification about the lateral proximal right femoral shaft, better demonstrated on the comparison and significance unclear. Hardware appears intact. Pelvis and left femur appear stable and intact.   IMPRESSION: 1. Right proximal femur arthroplasty with no adverse features. 2. Heterotopic bone versus periosteal reaction along the proximal third right femur shaft of unclear significance. Followup dedicated right femur series to fully visualize is recommended. CLINICAL DATA:  Cough   EXAM: PORTABLE CHEST - 1 VIEW SAME DAY   COMPARISON:  Chest x-ray of 04/08/2014 and 10/09/2010   FINDINGS: Minimally increased opacity is noted medially at right lung base and developing pneumonia cannot be excluded. Correlate clinically and consider follow-up. Otherwise the lungs are clear. No effusion is seen. Cardiomegaly is stable. The descending thoracic aorta remains ectatic. The questioned T12 compression deformity is not as well seen on the current view.   IMPRESSION: 1. Slight increased opacity medially at the right lung base. Recommend followup to exclude pneumonia. 2. Stable cardiomegaly.   ASSESSMENT/PLAN:  Right hip fracture S/P Right hip arthroplasty - for rehabilitation Hyperlipidemia - continue Pravachol Hypertension - well-controlled; continue Sotalol; check BMP Atrial Fibrillation - rate-controlled; continue Sotalol and Coumadin Anemia, acute blood loss - check CBC Pneumonia - stable; continue Augmentin Allergic rhinitis - continue Allegra Depression - stable; continue Effexor Osteoporosis - continue Actonel Oral Candida - start Magic mouthwash 10 ml swish and swallow QID X 14 days    CPT CODE: 69629  [name] - NP [facility] [phone]

## 2014-04-17 ENCOUNTER — Non-Acute Institutional Stay (SKILLED_NURSING_FACILITY): Payer: Medicare Other | Admitting: Internal Medicine

## 2014-04-17 DIAGNOSIS — J690 Pneumonitis due to inhalation of food and vomit: Secondary | ICD-10-CM

## 2014-04-17 DIAGNOSIS — S72009S Fracture of unspecified part of neck of unspecified femur, sequela: Secondary | ICD-10-CM

## 2014-04-17 DIAGNOSIS — I4891 Unspecified atrial fibrillation: Secondary | ICD-10-CM

## 2014-04-17 DIAGNOSIS — I10 Essential (primary) hypertension: Secondary | ICD-10-CM

## 2014-04-17 DIAGNOSIS — S72001S Fracture of unspecified part of neck of right femur, sequela: Secondary | ICD-10-CM

## 2014-04-19 ENCOUNTER — Non-Acute Institutional Stay (SKILLED_NURSING_FACILITY): Payer: Medicare Other | Admitting: Adult Health

## 2014-04-19 DIAGNOSIS — S72001S Fracture of unspecified part of neck of right femur, sequela: Secondary | ICD-10-CM

## 2014-04-19 DIAGNOSIS — F3289 Other specified depressive episodes: Secondary | ICD-10-CM

## 2014-04-19 DIAGNOSIS — M810 Age-related osteoporosis without current pathological fracture: Secondary | ICD-10-CM

## 2014-04-19 DIAGNOSIS — I4891 Unspecified atrial fibrillation: Secondary | ICD-10-CM

## 2014-04-19 DIAGNOSIS — E785 Hyperlipidemia, unspecified: Secondary | ICD-10-CM

## 2014-04-19 DIAGNOSIS — J3089 Other allergic rhinitis: Secondary | ICD-10-CM

## 2014-04-19 DIAGNOSIS — D62 Acute posthemorrhagic anemia: Secondary | ICD-10-CM

## 2014-04-19 DIAGNOSIS — F329 Major depressive disorder, single episode, unspecified: Secondary | ICD-10-CM

## 2014-04-19 DIAGNOSIS — S72009S Fracture of unspecified part of neck of unspecified femur, sequela: Secondary | ICD-10-CM

## 2014-04-19 DIAGNOSIS — I10 Essential (primary) hypertension: Secondary | ICD-10-CM

## 2014-04-19 DIAGNOSIS — F32A Depression, unspecified: Secondary | ICD-10-CM

## 2014-04-20 ENCOUNTER — Encounter: Payer: Self-pay | Admitting: Adult Health

## 2014-04-20 NOTE — Progress Notes (Signed)
Patient ID: [name], female   DOB: 02-Oct-1928, 80 y.o.   MRN: [id number]              PROGRESS NOTE  DATE:  04/19/14  FACILITY: Nursing Home Location: [facility]  LEVEL OF CARE: SNF (31)  Acute Visit  CHIEF COMPLAINT:  Discharge Notes  HISTORY OF PRESENT ILLNESS: This is an 80 year old female who is for discharge home with Home health PT, OT, Nursing and CNA. DME: Bedside commode and rolling walker. She has been admitted to [facility] on 04/11/14 from [facility]. She fell @ home and sustained a right femoral neck fracture S/P right hip arthroplasty. Patient was admitted to this facility for short-term rehabilitation after the patient's recent hospitalization.  Patient has completed SNF rehabilitation and therapy has cleared the patient for discharge.   REASSESSMENT OF ONGOING PROBLEM(S):  HTN: Pt 's HTN remains stable.  Denies CP, sob, DOE, pedal edema, headaches, dizziness or visual disturbances.  No complications from the medications currently being used.  Last BP : 127/80  ANEMIA: The anemia has been stable. The patient denies fatigue, melena or hematochezia. No complications from the medications currently being used. 7/15 hgb 9.7  ATRIAL FIBRILLATION: the patients atrial fibrillation remains stable.  The patient denies DOE, tachycardia, orthopnea, transient neurological sx, pedal edema, palpitations, & PNDs.  No complications noted from the medications currently being used.   PAST MEDICAL HISTORY : Reviewed.  No changes/see problem list  CURRENT MEDICATIONS: Reviewed per MAR/see medication list  REVIEW OF SYSTEMS:  GENERAL: no change in appetite, no fatigue, no weight changes, no fever, chills or weakness RESPIRATORY: no cough, SOB, DOE, wheezing, hemoptysis CARDIAC: no chest pain, edema or palpitations GI: no abdominal pain, diarrhea, constipation, heart burn, nausea or vomiting  PHYSICAL EXAMINATION  GENERAL: no acute distress, normal body  habitus MOUTH:  Tongue has cheesy grayish plaques NECK: supple, trachea midline, no neck masses, no thyroid tenderness, no thyromegaly LYMPHATICS: no LAN in the neck, no supraclavicular LAN RESPIRATORY: breathing is even & unlabored, BS CTAB CARDIAC: RRR, no murmur,no extra heart sounds, no edema GI: abdomen soft, normal BS, no masses, no tenderness, no hepatomegaly, no splenomegaly EXTREMITIES:  Able to move all 4 extremities; limited ROM on RLE PSYCHIATRIC: the patient is alert & oriented to person, affect & behavior appropriate  LABS/RADIOLOGY: 04/16/14  WBC 7.2 hemoglobin 9.7 hematocrit 31.6 sodium 138 potassium 4.0 glucose 88 BUN 11 creatinine 0.6 calcium 8.2 Labs reviewed: Basic Metabolic Panel:  Recent Labs  04/08/14 0121 04/09/14 0612 04/10/14 0504  NA 143 140 137  K 4.3 4.4 4.6  CL 103 103 101  CO2 27 27 26   GLUCOSE 115* 148* 144*  BUN 25* 20 19  CREATININE 0.77 0.90 0.80  CALCIUM 9.2 8.1* 8.4   CBC:  Recent Labs  04/08/14 0121 04/09/14 0612 04/10/14 0504 04/11/14 0735  WBC 7.4 6.0 7.1 6.8  NEUTROABS 5.7  --   --   --   HGB 13.5 11.6* 10.7* 10.3*  HCT 42.4 37.1 33.2* 31.7*  MCV 86.9 89.6 86.9 86.6  PLT 149* 124* 120* 122*   CLINICAL DATA:  Fall.   EXAM: CHEST - 1 VIEW   COMPARISON:  Chest radiograph October 09, 2010   FINDINGS: The cardiac silhouette remains mild to moderately enlarged. Tortuous, possibly ectatic calcified aortic is similar. Mild chronic interstitial changes, elevated right hemidiaphragm. No pleural effusions or focal consolidations. No pneumothorax. Soft tissue planes are nonsuspicious. Moderate age indeterminate  approximate T12 compression fracture appears new from prior chest radiograph.   IMPRESSION: Stable cardiomegaly, chronic interstitial changes.   Age indeterminate at least moderate T12 compression fracture, recommend correlation with point tenderness.   CLINICAL DATA:  80 year old female status post surgery.  Initial encounter.   EXAM: PORTABLE PELVIS 1-2 VIEWS   COMPARISON:  0017 hr the same day.   FINDINGS: Portable AP supine view at 1651 hrs. Sequelae of right proximal femur arthroplasty. Overlying soft tissue postoperative changes. Unchanged heterotopic ossification about the lateral proximal right femoral shaft, better demonstrated on the comparison and significance unclear. Hardware appears intact. Pelvis and left femur appear stable and intact.   IMPRESSION: 1. Right proximal femur arthroplasty with no adverse features. 2. Heterotopic bone versus periosteal reaction along the proximal third right femur shaft of unclear significance. Followup dedicated right femur series to fully visualize is recommended. CLINICAL DATA:  Cough   EXAM: PORTABLE CHEST - 1 VIEW SAME DAY   COMPARISON:  Chest x-ray of 04/08/2014 and 10/09/2010   FINDINGS: Minimally increased opacity is noted medially at right lung base and developing pneumonia cannot be excluded. Correlate clinically and consider follow-up. Otherwise the lungs are clear. No effusion is seen. Cardiomegaly is stable. The descending thoracic aorta remains ectatic. The questioned T12 compression deformity is not as well seen on the current view.   IMPRESSION: 1. Slight increased opacity medially at the right lung base. Recommend followup to exclude pneumonia. 2. Stable cardiomegaly.   ASSESSMENT/PLAN:  Right hip fracture S/P Right hip arthroplasty - for home health  PT, OT, Nursing and CNA Hyperlipidemia - continue Pravachol Hypertension - well-controlled; continue Sotalol Atrial Fibrillation - rate-controlled; continue Sotalol and Coumadin Anemia, acute blood loss - stable Allergic rhinitis - continue Allegra Depression - stable; continue Effexor Osteoporosis - continue Actonel   I have filled out patient's discharge paperwork and written prescriptions.  Patient will receive home health PT, OT, Nursing and CNA.  DME  provided:  Bedside commode and rolling walker  Total discharge time: Greater than 30 minutes  Discharge time involved coordination of the discharge process with social worker, nursing staff and therapy department. Medical justification for home health services/DME verified.    CPT CODE: 32122  Seth Bake - NP [facility] [phone]

## 2014-04-21 NOTE — Progress Notes (Signed)
HISTORY & PHYSICAL  DATE: 04/17/2014   FACILITY: [facility]  LEVEL OF CARE: SNF (31)  ALLERGIES:  Allergies  Allergen Reactions  . Contrast Media [Iodinated Diagnostic Agents] Anaphylaxis  . Amiodarone     Pulmonary edema  . Flecainide     Parkinson like raction  . Latex Itching and Swelling  . Multaq [Dronedarone Hydrochloride] Swelling  . Sulfa Antibiotics Other (See Comments)    Makes her sicker & it causes her to have dark rings around her eyes    CHIEF COMPLAINT:  Manage right femoral neck fracture, pneumonia and atrial fibrillation  HISTORY OF PRESENT ILLNESS: Patient is an 80 year old Caucasian female.  HIP FRACTURE: The patient had a mechanical fall and sustained a femur fracture.  Patient subsequently underwent right hip arthroplasty and tolerated the procedure well. Patient is admitted to this facility for short-term rehabilitation. Patient denies hip pain currently. No complications reported from the pain medications currently being used.  PNEUMONIA: The pneumonia remains stable.  The patient denies ongoing chest pain, cough, shortness of breath, fever, chills or night sweats. No complications reported from the current antibiotic being used.  ATRIAL FIBRILLATION: the patients atrial fibrillation remains stable.  The patient denies DOE, tachycardia, orthopnea, transient neurological sx, pedal edema, palpitations, & PNDs.  No complications noted from the medications currently being used.  PAST MEDICAL HISTORY :  Past Medical History  Diagnosis Date  . Hyperlipidemia   . Allergy   . Osteoporosis   . Hypertension   . A-fib     On chronic anticoagulation  . PONV (postoperative nausea and vomiting)   . Peripheral vascular disease   . Pneumonia   . GERD (gastroesophageal reflux disease)   . Headache(784.0)   . Dysrhythmia     hx of atrial fibrilation    PAST SURGICAL HISTORY: Past Surgical History  Procedure Laterality Date  .  Cardiac electrophysiology mapping and ablation    . Bunionectomy    . Fracture surgery    . Hemiarthroplasty hip Right 04/08/2014  . Hip arthroplasty Right 04/08/2014    Procedure: ARTHROPLASTY BIPOLAR HIP;  Surgeon: Mcarthur Rossetti, MD;  Location: [facility];  Service: Orthopedics;  Laterality: Right;    SOCIAL HISTORY:  reports that she has quit smoking. She has quit using smokeless tobacco. She reports that she drinks alcohol. She reports that she does not use illicit drugs.  FAMILY HISTORY:  Family History  Problem Relation Age of Onset  . Aneurysm Mother   . Tuberculosis Father     CURRENT MEDICATIONS: Reviewed per MAR/see medication list  REVIEW OF SYSTEMS:  See HPI otherwise 14 point ROS is negative.  PHYSICAL EXAMINATION  VS:  See VS section  GENERAL: no acute distress, normal body habitus EYES: conjunctivae normal, sclerae normal, normal eye lids MOUTH/THROAT: lips without lesions,no lesions in the mouth,tongue is without lesions,uvula elevates in midline NECK: supple, trachea midline, no neck masses, no thyroid tenderness, no thyromegaly LYMPHATICS: no LAN in the neck, no supraclavicular LAN RESPIRATORY: breathing is even & unlabored, BS CTAB CARDIAC: Heart rate is irregularly irregular, no murmur,no extra heart sounds, +1 right lower extremity edema GI:  ABDOMEN: abdomen soft, normal BS, no masses, no tenderness  LIVER/SPLEEN: no hepatomegaly, no splenomegaly MUSCULOSKELETAL: HEAD: normal to inspection  EXTREMITIES: LEFT UPPER EXTREMITY: full range of motion, normal strength & tone RIGHT UPPER EXTREMITY:  full range of motion, normal strength & tone LEFT LOWER EXTREMITY:  Moderate range of  motion, normal strength & tone RIGHT LOWER EXTREMITY:  range of motion not tested due to surgery, normal strength & tone PSYCHIATRIC: the patient is alert & oriented to person, affect & behavior appropriate  LABS/RADIOLOGY:  Labs reviewed: Basic Metabolic Panel:  Recent  Labs  04/08/14 0121 04/09/14 0612 04/10/14 0504  NA 143 140 137  K 4.3 4.4 4.6  CL 103 103 101  CO2 27 27 26   GLUCOSE 115* 148* 144*  BUN 25* 20 19  CREATININE 0.77 0.90 0.80  CALCIUM 9.2 8.1* 8.4   CBC:  Recent Labs  04/08/14 0121 04/09/14 0612 04/10/14 0504 04/11/14 0735  WBC 7.4 6.0 7.1 6.8  NEUTROABS 5.7  --   --   --   HGB 13.5 11.6* 10.7* 10.3*  HCT 42.4 37.1 33.2* 31.7*  MCV 86.9 89.6 86.9 86.6  PLT 149* 124* 120* 122*    RIGHT HIP - COMPLETE 2+ VIEW   COMPARISON:  None.   FINDINGS: Severely displaced and angulated fracture involving the proximal right femoral neck is noted. Femoral head is located within the acetabulum.   IMPRESSION: Displaced and angulated proximal right femoral neck fracture. CHEST - 1 VIEW   COMPARISON:  Chest radiograph October 09, 2010   FINDINGS: The cardiac silhouette remains mild to moderately enlarged. Tortuous, possibly ectatic calcified aortic is similar. Mild chronic interstitial changes, elevated right hemidiaphragm. No pleural effusions or focal consolidations. No pneumothorax. Soft tissue planes are nonsuspicious. Moderate age indeterminate approximate T12 compression fracture appears new from prior chest radiograph.   IMPRESSION: Stable cardiomegaly, chronic interstitial changes.   Age indeterminate at least moderate T12 compression fracture, recommend correlation with point tenderness.   PORTABLE PELVIS 1-2 VIEWS   COMPARISON:  0017 hr the same day.   FINDINGS: Portable AP supine view at 1651 hrs. Sequelae of right proximal femur arthroplasty. Overlying soft tissue postoperative changes. Unchanged heterotopic ossification about the lateral proximal right femoral shaft, better demonstrated on the comparison and significance unclear. Hardware appears intact. Pelvis and left femur appear stable and intact.   IMPRESSION: 1. Right proximal femur arthroplasty with no adverse features. 2. Heterotopic bone  versus periosteal reaction along the proximal third right femur shaft of unclear significance. Followup dedicated right femur series to fully visualize is recommended.   PORTABLE CHEST - 1 VIEW SAME DAY   COMPARISON:  Chest x-ray of 04/08/2014 and 10/09/2010   FINDINGS: Minimally increased opacity is noted medially at right lung base and developing pneumonia cannot be excluded. Correlate clinically and consider follow-up. Otherwise the lungs are clear. No effusion is seen. Cardiomegaly is stable. The descending thoracic aorta remains ectatic. The questioned T12 compression deformity is not as well seen on the current view.   IMPRESSION: 1. Slight increased opacity medially at the right lung base. Recommend followup to exclude pneumonia. 2. Stable cardiomegaly.   ASSESSMENT/PLAN:  Right femoral neck fracture-status post  arthroplasty. Continue rehabilitation. Pneumonia-continue Augmentin as prescribed Atrial fibrillation-rate controlled Hypertension-well-controlled Acute blood loss anemia-check hemoglobin Hyperlipidemia-continue pravastatin Check CBC and BMP  I have reviewed patient's medical records received at admission/from hospitalization.  CPT CODE: 62836  Tinleigh Whitmire Y Eloyce Bultman, Junior [phone]

## 2014-04-26 ENCOUNTER — Other Ambulatory Visit: Payer: Self-pay | Admitting: *Deleted

## 2014-04-27 NOTE — OR Nursing (Signed)
Late entry for type, subtype and infection under procedural findings

## 2014-10-15 ENCOUNTER — Ambulatory Visit (INDEPENDENT_AMBULATORY_CARE_PROVIDER_SITE_OTHER): Payer: Medicare Other | Admitting: Podiatry

## 2014-10-15 ENCOUNTER — Encounter: Payer: Self-pay | Admitting: Podiatry

## 2014-10-15 DIAGNOSIS — B351 Tinea unguium: Secondary | ICD-10-CM

## 2014-10-15 DIAGNOSIS — M79676 Pain in unspecified toe(s): Secondary | ICD-10-CM

## 2014-10-15 NOTE — Patient Instructions (Signed)
Apply topical antibiotic ointment daily to the right great toenail daily, cover with a Band-Aid until healed

## 2014-10-16 DIAGNOSIS — M19049 Primary osteoarthritis, unspecified hand: Secondary | ICD-10-CM | POA: Insufficient documentation

## 2014-10-16 NOTE — Progress Notes (Signed)
Patient ID: [name], female   DOB: 12/23/1927, 81 y.o.   MRN: [id number]  Subjective: This patient presents complaining of painful toenails and walking wearing shoes. Last visit for this similar service was on 01/08/2014. Patient has a history hip fracture since last visit  Objective: The toenails are hypertrophic, elongated, incurvated, discolored and tender to palpation 6-10  Assessment Neglected symptomatic onychomycoses 6-10  Plan: Debrided toenails 10 The right hallux debridement of completely and the underlying nailbed as a red granular base without any active drainage, malodor or warmth noted  Patient was advised to apply topical antibiotic ointment and a Band-Aid to the right hallux nail bed daily until healed  I recommended reappoint at three-month intervals and patient declined to reappoint

## 2015-03-04 ENCOUNTER — Ambulatory Visit (INDEPENDENT_AMBULATORY_CARE_PROVIDER_SITE_OTHER): Payer: Medicare Other | Admitting: Podiatry

## 2015-03-04 DIAGNOSIS — M79676 Pain in unspecified toe(s): Secondary | ICD-10-CM

## 2015-03-04 DIAGNOSIS — B351 Tinea unguium: Secondary | ICD-10-CM | POA: Diagnosis not present

## 2015-03-05 NOTE — Progress Notes (Signed)
Patient ID: [name], female   DOB: 05-22-1928, 81 y.o.   MRN: [id number]  Subjective: This patient presents again complaining of painful toenails and requesting nail debridement  Objective: The toenails are brittle, elongated, incurvated, discolored and tender to direct palpation 6-10  Assessment: Symptomatic onychomycoses 6-10  Plan: Debridement of toenails 10 without any bleeding  Reappoint 3 months

## 2015-03-27 ENCOUNTER — Ambulatory Visit (INDEPENDENT_AMBULATORY_CARE_PROVIDER_SITE_OTHER): Payer: Medicare Other | Admitting: Podiatry

## 2015-03-27 ENCOUNTER — Encounter: Payer: Self-pay | Admitting: Podiatry

## 2015-03-27 ENCOUNTER — Ambulatory Visit (INDEPENDENT_AMBULATORY_CARE_PROVIDER_SITE_OTHER): Payer: Medicare Other

## 2015-03-27 VITALS — BP 128/74 | HR 62 | Temp 97.6°F | Resp 12

## 2015-03-27 DIAGNOSIS — L02611 Cutaneous abscess of right foot: Secondary | ICD-10-CM

## 2015-03-27 DIAGNOSIS — R52 Pain, unspecified: Secondary | ICD-10-CM | POA: Diagnosis not present

## 2015-03-27 DIAGNOSIS — L03031 Cellulitis of right toe: Secondary | ICD-10-CM | POA: Diagnosis not present

## 2015-03-27 NOTE — Patient Instructions (Signed)
Okay to soak right foot daily in Dial) bacterial soft soap and apply topical anabiotic ointment to the fourth right toe daily, cover with gauze and attach with Coflex tape Wear toe prop on the right foot Limit standing walking

## 2015-03-27 NOTE — Progress Notes (Signed)
   Subjective:    Patient ID: [name], female    DOB: 1927/12/14, 81 y.o.   MRN: [id number]  HPI N-SORE L-RT FOOT 4TH TOE D-3 WEEKS O-SLOWLY C-WORSE A-PRESSURE T-TRIPPLE ANTIBIOTIC  Patient describes reducing redness in the fourth right toe, however the toe remains tender. She has a history of Clostridium difficile associated with oral anabiotic's and had to travel out of state ultimately to resolve the Clostridium difficile.   Review of Systems  Musculoskeletal: Positive for gait problem.  Skin: Positive for color change.       Objective:   Physical Exam  Pleasant orientated 3  Vascular: DP and PT pulses 2/4 bilaterally Capillary reflex immediate bilaterally  Dermatological: The fourth right toe distally is erythematous and edematous with a blister formation. There is no active drainage or malodor or warmth noted. The toe is relatively long.  X-ray examination right foot  Decrease joint space with apparent surgical resection base of proximal phalanx hallux Decrease joint space second MPJ Surgical resection lateral condyle head of fifth metatarsal Increased soft tissue density fourth left toe without emphysema or cortical disruption Decreased bone density noted all views  Radiographic impression: No acute bony abnormality noted    Assessment & Plan:   Assessment: Cellulitis fourth right toe History of Clostridium difficile  Plan: At this time because patient is very hasn't been to use oral anabiotic specific extreme difficulty with Clostridium difficile we'll attempt local care. A toe crest was dispensed to elevate the distal aspect of the fourth right toe and wear a stiff soled shoe Continue applying topical anabiotic daily to the fourth right toe If the symptoms are worsening I will consider oral anabiotic's, however, patient is at risk for recurrence of Clostridium difficile associated with any anabiotic  Reappoint 7 days.

## 2015-04-03 ENCOUNTER — Ambulatory Visit (INDEPENDENT_AMBULATORY_CARE_PROVIDER_SITE_OTHER): Payer: Medicare Other | Admitting: Podiatry

## 2015-04-03 ENCOUNTER — Encounter: Payer: Self-pay | Admitting: Podiatry

## 2015-04-03 VITALS — BP 143/87 | HR 64 | Temp 96.7°F | Resp 12

## 2015-04-03 DIAGNOSIS — L03031 Cellulitis of right toe: Secondary | ICD-10-CM | POA: Diagnosis not present

## 2015-04-03 DIAGNOSIS — L02611 Cutaneous abscess of right foot: Secondary | ICD-10-CM

## 2015-04-03 NOTE — Progress Notes (Signed)
   Subjective:    Patient ID: [name], female    DOB: 1928-08-04, 81 y.o.   MRN: [id number]  HPI  ''RT FOOT 4TH TOE IS DOING OK.'' This patient presents for follow-up visit with her daughter present in the treatment room from the visit of 03/27/2015. At the initial visit because of patient's history of C. difficile local wound care was recommended with a toe crest dispensed. Patient describes increasing pain and contacted primary physician who prescribed doxycycline 100 mg by mouth twice a day 7 days. Patient is currently taking doxycycline and the symptoms in the fourth right toe has significantly improved.  Review of Systems  Cardiovascular: Positive for leg swelling.       Objective:   Physical Exam  Orientated 3 Vascular: DP and PT pulses 2/4 bilaterally No peripheral edema or calf pain bilaterally  Dermatological: The distal fourth right toe is mildly erythematous and edematous with eschar after debridement releases no drainage with slight breathing in the area.      Assessment & Plan:   Assessment: Resolving cellulitis fourth toe right foot  Plan: As patient is tolerating prescribed doxycycline 100 mg by mouth twice a day 7 days I advised patient to complete all remaining prescribed oral anabiotic's A anabiotic dressing was applied to the fourth right toe DC wearing toe crest as is causing patient irritation Maintain daily application topical anabiotic ointment until eschar sloughs. I demonstrated patient's daughter to correct bandaging technique  Reappoint at patient's request

## 2015-04-03 NOTE — Patient Instructions (Signed)
Continue to wrap fourth right toe with Coflex tape after a applying a small amount of topical anabiotic ointment Take doxycycline 100 mg as prescribed on an empty stomach  Reappoint at patient's request

## 2015-04-17 ENCOUNTER — Ambulatory Visit (INDEPENDENT_AMBULATORY_CARE_PROVIDER_SITE_OTHER): Payer: Medicare Other | Admitting: Podiatry

## 2015-04-17 ENCOUNTER — Encounter: Payer: Self-pay | Admitting: Podiatry

## 2015-04-17 VITALS — BP 154/94 | HR 64 | Resp 14

## 2015-04-17 DIAGNOSIS — L02611 Cutaneous abscess of right foot: Secondary | ICD-10-CM

## 2015-04-17 DIAGNOSIS — L03031 Cellulitis of right toe: Secondary | ICD-10-CM

## 2015-04-17 MED ORDER — DOXYCYCLINE HYCLATE 100 MG PO CAPS: 100.0000 mg | ORAL_CAPSULE | Freq: Two times a day (BID) | ORAL | Status: DC

## 2015-04-17 NOTE — Progress Notes (Signed)
   Subjective:    Patient ID: [name], female    DOB: 03/17/1928, 81 y.o.   MRN: [id number]  HPI  Patient presents today for follow-up care for  cellulitis fourth right toe. She  completed 7 days of doxycycline 100 mg by mouth twice a day and denies any complaint from the medication including diarrhea. Her nurse daughter is not present today but does send a note asking if she needs an additional course of doxycycline. She did have a history of MRSA associated with anabiotics and I was hesitant to prescribe any further anabiotic son the initial visit. When she contacted her primary care physician he prescribed doxycycline 100 mg by mouth twice a day for the initial 7 days patient is was instructed to wear toe prop to elevate the distal aspect of the toes, however, patient seems to be confused as to how to use and wear the toe prop  Review of Systems  All other systems reviewed and are negative.      Objective:   Physical Exam  Pleasant orientated 3  Vascular: No peripheral edema or calf pain or calf edema noted bilaterally DP and PT pulses 2/4 bilaterally  Dermatological: There is a low-grade edema and erythema of the distal fourth right toe with an eschar at the distal aspect of the fourth right toe. The eschar was debrided and no drainage was released. Slight bleeding the area was noted  Musculoskeletal: The  fourth right toe is relatively long creating pressure at the distal aspect        Assessment & Plan:   Assessment: Residual cellulitis fourth toe right foot  Plan: Debrided eschar fourth right toe and apply anabiotic dressing Patient advised to continue applying topical anabiotic dressing I demonstrated how to place the toe prop on the third toe to elevate toes 2-4 Rx doxycycline 100 mg by mouth twice a day 7 days  Reappoint times 10-14 days

## 2015-04-17 NOTE — Patient Instructions (Signed)
I ordered an additional 7 days of doxycycline 100 mg twice a day Please take an empty stomach Please wear toe prop Apply small amount of topical and biotic ointment to the end of the fourth right toe daily

## 2015-04-18 ENCOUNTER — Encounter: Payer: Self-pay | Admitting: Podiatry

## 2015-05-29 ENCOUNTER — Encounter: Payer: Self-pay | Admitting: Podiatry

## 2015-05-29 ENCOUNTER — Ambulatory Visit (INDEPENDENT_AMBULATORY_CARE_PROVIDER_SITE_OTHER): Payer: Medicare Other | Admitting: Podiatry

## 2015-05-29 DIAGNOSIS — M79676 Pain in unspecified toe(s): Secondary | ICD-10-CM | POA: Diagnosis not present

## 2015-05-29 DIAGNOSIS — B351 Tinea unguium: Secondary | ICD-10-CM

## 2015-05-30 NOTE — Progress Notes (Signed)
Patient ID: [name], female   DOB: 1927-11-30, 81 y.o.   MRN: [id number]  Subjective: This patient presents today complaining of painful toenails walking wearing shoes and request toenail debridement she has a history of infection of fourth right toe which is improved with oral doxycycline and continue wearing a toe prop.  Objective: Distal fourth right toe is remains mildly edematous without any open lesions small keratoses on fourth right toe The toenails are brittle, discolored, incurvated, hypertrophic and tender direct palpation 6-10  Assessment: Symptomatic onychomycoses 6-10 Resolved infection fourth right toe with chronic low-grade edema  Plan: Debridement of toenails 10 mechanically and allegedly without any bleeding  Reappoint 3 months

## 2015-07-01 DIAGNOSIS — M25562 Pain in left knee: Secondary | ICD-10-CM | POA: Insufficient documentation

## 2015-08-28 ENCOUNTER — Ambulatory Visit: Payer: Medicare Other | Admitting: Podiatry

## 2015-09-24 ENCOUNTER — Ambulatory Visit: Payer: Medicare Other | Admitting: Podiatry

## 2016-01-15 DIAGNOSIS — R7301 Impaired fasting glucose: Secondary | ICD-10-CM | POA: Insufficient documentation

## 2016-01-22 DIAGNOSIS — D692 Other nonthrombocytopenic purpura: Secondary | ICD-10-CM | POA: Insufficient documentation

## 2016-01-22 DIAGNOSIS — F39 Unspecified mood [affective] disorder: Secondary | ICD-10-CM | POA: Insufficient documentation

## 2016-06-23 ENCOUNTER — Ambulatory Visit (INDEPENDENT_AMBULATORY_CARE_PROVIDER_SITE_OTHER): Payer: Medicare Other | Admitting: Podiatry

## 2016-06-23 ENCOUNTER — Encounter: Payer: Self-pay | Admitting: Podiatry

## 2016-06-23 DIAGNOSIS — M79676 Pain in unspecified toe(s): Secondary | ICD-10-CM

## 2016-06-23 DIAGNOSIS — B351 Tinea unguium: Secondary | ICD-10-CM | POA: Diagnosis not present

## 2016-06-23 NOTE — Progress Notes (Signed)
Patient ID: [name], female   DOB: [birth year], 82 y.o.   MRN: [id number]   Subjective: This patient presents complaining of elongated and thickened toenails which or cough walking wearing shoes and requests toenail debridement. Last visit for this service was 05/29/2015  Objective: Orientated 3 Open skin lesions bilaterally Atrophic skin bilaterally DP and PT pulses 1/4 bilaterally Capillary reflex immediate bilaterally Toenails are extremely elongated, brittle, discolored, deformed, hypertrophic and tender to direct palpation 6-10  Assessment: Symptomatic onychomycoses 6-10  Plan: Debridement toenails 6-10 mechanically and electrically with slight bleeding distal left hallux, treated with topical antibiotic management and Band-Aid. Patient advised with Band-Aid 1-3 days and continue applying topical antibiotic ointment daily to a scab forms  Reappoint at patient's request

## 2016-06-30 ENCOUNTER — Ambulatory Visit: Payer: Medicare Other | Admitting: Podiatry

## 2016-08-24 ENCOUNTER — Telehealth: Payer: Self-pay | Admitting: Vascular Surgery

## 2016-08-24 ENCOUNTER — Encounter (HOSPITAL_COMMUNITY): Payer: Self-pay | Admitting: Emergency Medicine

## 2016-08-24 ENCOUNTER — Emergency Department (HOSPITAL_COMMUNITY)
Admission: EM | Admit: 2016-08-24 | Discharge: 2016-08-24 | Disposition: A | Discharge status: Home / Self Care | Payer: Medicare Other | Attending: Emergency Medicine | Admitting: Emergency Medicine

## 2016-08-24 ENCOUNTER — Emergency Department (HOSPITAL_COMMUNITY): Payer: Medicare Other

## 2016-08-24 ENCOUNTER — Ambulatory Visit (HOSPITAL_COMMUNITY): Payer: Medicare Other

## 2016-08-24 DIAGNOSIS — S0990XA Unspecified injury of head, initial encounter: Secondary | ICD-10-CM | POA: Diagnosis not present

## 2016-08-24 DIAGNOSIS — W010XXA Fall on same level from slipping, tripping and stumbling without subsequent striking against object, initial encounter: Secondary | ICD-10-CM | POA: Insufficient documentation

## 2016-08-24 DIAGNOSIS — Z79899 Other long term (current) drug therapy: Secondary | ICD-10-CM | POA: Insufficient documentation

## 2016-08-24 DIAGNOSIS — Z91040 Latex allergy status: Secondary | ICD-10-CM | POA: Diagnosis not present

## 2016-08-24 DIAGNOSIS — M79674 Pain in right toe(s): Secondary | ICD-10-CM

## 2016-08-24 DIAGNOSIS — Z87891 Personal history of nicotine dependence: Secondary | ICD-10-CM | POA: Diagnosis not present

## 2016-08-24 DIAGNOSIS — W19XXXA Unspecified fall, initial encounter: Secondary | ICD-10-CM

## 2016-08-24 DIAGNOSIS — S86922A Laceration of unspecified muscle(s) and tendon(s) at lower leg level, left leg, initial encounter: Secondary | ICD-10-CM | POA: Diagnosis not present

## 2016-08-24 DIAGNOSIS — Y999 Unspecified external cause status: Secondary | ICD-10-CM | POA: Insufficient documentation

## 2016-08-24 DIAGNOSIS — Z7901 Long term (current) use of anticoagulants: Secondary | ICD-10-CM | POA: Insufficient documentation

## 2016-08-24 DIAGNOSIS — I10 Essential (primary) hypertension: Secondary | ICD-10-CM | POA: Insufficient documentation

## 2016-08-24 DIAGNOSIS — Y929 Unspecified place or not applicable: Secondary | ICD-10-CM | POA: Insufficient documentation

## 2016-08-24 DIAGNOSIS — Y939 Activity, unspecified: Secondary | ICD-10-CM | POA: Diagnosis not present

## 2016-08-24 DIAGNOSIS — Z96641 Presence of right artificial hip joint: Secondary | ICD-10-CM | POA: Diagnosis not present

## 2016-08-24 DIAGNOSIS — S81812A Laceration without foreign body, left lower leg, initial encounter: Secondary | ICD-10-CM | POA: Diagnosis not present

## 2016-08-24 LAB — PROTIME-INR
INR: 2.33
Prothrombin Time: 26 seconds — ABNORMAL HIGH (ref 11.4–15.2)

## 2016-08-24 LAB — BASIC METABOLIC PANEL
Anion gap: 6 (ref 5–15)
BUN: 17 mg/dL (ref 6–20)
CO2: 27 mmol/L (ref 22–32)
Calcium: 8.9 mg/dL (ref 8.9–10.3)
Chloride: 108 mmol/L (ref 101–111)
Creatinine, Ser: 0.85 mg/dL (ref 0.44–1.00)
GFR calc Af Amer: >60 mL/min (ref >60)
Glucose, Bld: 127 mg/dL — ABNORMAL HIGH (ref 65–99)
POTASSIUM: 4.3 mmol/L (ref 3.5–5.1)
SODIUM: 141 mmol/L (ref 135–145)

## 2016-08-24 LAB — CBC WITH DIFFERENTIAL/PLATELET
BASOS ABS: 0 10*3/uL (ref 0.0–0.1)
Basophils Relative: 0 %
EOS PCT: 2 %
Eosinophils Absolute: 0.1 10*3/uL (ref 0.0–0.7)
HCT: 37.2 % (ref 36.0–46.0)
HEMOGLOBIN: 11.7 g/dL — AB (ref 12.0–15.0)
LYMPHS ABS: 1.2 10*3/uL (ref 0.7–4.0)
Lymphocytes Relative: 19 %
MCH: 26.4 pg (ref 26.0–34.0)
MCHC: 31.5 g/dL (ref 30.0–36.0)
MCV: 84 fL (ref 78.0–100.0)
Monocytes Absolute: 0.6 10*3/uL (ref 0.1–1.0)
Monocytes Relative: 10 %
Neutro Abs: 4.4 10*3/uL (ref 1.7–7.7)
Neutrophils Relative %: 69 %
PLATELETS: 190 10*3/uL (ref 150–400)
RBC: 4.43 MIL/uL (ref 3.87–5.11)
RDW: 16.4 % — ABNORMAL HIGH (ref 11.5–15.5)
WBC: 6.4 10*3/uL (ref 4.0–10.5)

## 2016-08-24 MED ORDER — LIDOCAINE-EPINEPHRINE 2 %-1:100000 IJ SOLN
20.0000 mL | INTRAMUSCULAR | Status: DC
  Filled 2016-08-24: qty 20

## 2016-08-24 MED ORDER — LIDOCAINE HCL (PF) 1 % IJ SOLN
INTRAMUSCULAR | Status: AC
  Filled 2016-08-24: qty 10

## 2016-08-24 MED ORDER — LIDOCAINE-EPINEPHRINE 2 %-1:100000 IJ SOLN: 20.0000 mL | Freq: Once | INTRAMUSCULAR | Status: DC

## 2016-08-24 MED ORDER — CEPHALEXIN 500 MG PO CAPS: 500.0000 mg | ORAL_CAPSULE | Freq: Two times a day (BID) | ORAL | 0 refills | Status: DC

## 2016-08-24 MED ORDER — DOXYCYCLINE HYCLATE 100 MG PO CAPS: 100.0000 mg | ORAL_CAPSULE | Freq: Two times a day (BID) | ORAL | 0 refills | Status: DC

## 2016-08-24 MED ORDER — AMOXICILLIN-POT CLAVULANATE 875-125 MG PO TABS: 1.0000 | ORAL_TABLET | Freq: Two times a day (BID) | ORAL | 0 refills | Status: DC

## 2016-08-24 MED ORDER — LIDOCAINE HCL (PF) 1 % IJ SOLN
10.0000 mL | Freq: Once | INTRAMUSCULAR | Status: AC
  Administered 2016-08-24: 10 mL via INTRADERMAL

## 2016-08-24 MED ORDER — AMOXICILLIN-POT CLAVULANATE 875-125 MG PO TABS: 1.0000 | ORAL_TABLET | Freq: Once | ORAL | Status: DC

## 2016-08-24 NOTE — Discharge Instructions (Signed)
Take your antibiotic as prescribed until completed. Continue taking her medications as prescribed. You may also take Tylenol as prescribed over-the-counter as needed for additional pain relief. I recommend following up with your primary care provider in 7 days for suture removal. I also recommend following up with your primary care provider at that time regarding your toe swelling and redness. I also recommend following up with the neurosurgery clinic listed below within the next 1-2 weeks for follow-up regarding your meningioma seen on your head CT and brain MRI today in the ED. Please return to the Emergency Department if symptoms worsen or new onset of fever, headache, visual changes, lightheadedness, dizziness, confusion, altered mental status, agitation, syncope, leading, swelling, redness, warmth, drainage, numbness, tingling, weakness.

## 2016-08-24 NOTE — Telephone Encounter (Signed)
Sched appt [facility] 11/21 at 11:00. Lm on hm# to inform pt of appt.

## 2016-08-24 NOTE — Telephone Encounter (Signed)
-----   Message from Mena Goes, RN sent at 08/24/2016  3:33 PM EST ----- Regarding: schedule Vein Clinic   ----- Message ----- From: Ulyses Amor, PA-C Sent: 08/24/2016   3:02 PM To: [facility] Charge Pool  F/U for [facility] for bleeding varicosity for possible sclero  therapy or ablation. Seen in ED for bleeding varicosity.  See Dr. Donnetta Hutching or Dr. Kellie Simmering per Dr. Bridgett Larsson

## 2016-08-24 NOTE — ED Triage Notes (Signed)
Pt coming from home via EMS. Pt fell in home whiling trying to step up into another room from the living room. Pt fell forward onto tile. Pt states that she did hit her head but pt is complaining of no pain to the head at this time. But has laceration on the left lower leg.

## 2016-08-24 NOTE — ED Provider Notes (Signed)
Hand-off from [employer], [employer]. Pending MR brain.   See initial provider's noted for full HPI.   Briefly patient is an 82 year old female with history of A. fib (on Coumadin) presenting from home after mechanical fall that occurred prior to arrival. Patient reports hitting her head and having associated laceration to her left shin. She also reports having associated pain to her right fourth toe. Denies LOC.   Physical Exam  BP 162/78   Pulse 96   Temp 98.5 F (36.9 C) (Oral)   Resp 19   SpO2 98%   Physical Exam  Constitutional: She is oriented to person, place, and time. She appears well-developed and well-nourished. No distress.  HENT:  Head: Normocephalic and atraumatic. Head is without raccoon's eyes, without Battle's sign, without abrasion and without laceration.  Right Ear: Tympanic membrane normal. No hemotympanum.  Left Ear: Tympanic membrane normal. No hemotympanum.  Nose: Nose normal. No sinus tenderness, nasal deformity, septal deviation or nasal septal hematoma. No epistaxis.  Mouth/Throat: Uvula is midline, oropharynx is clear and moist and mucous membranes are normal. No oropharyngeal exudate, posterior oropharyngeal edema, posterior oropharyngeal erythema or tonsillar abscesses.  Eyes: Conjunctivae and EOM are normal. Pupils are equal, round, and reactive to light. Right eye exhibits no discharge. Left eye exhibits no discharge. No scleral icterus.  Neck: Normal range of motion. Neck supple.  Cardiovascular: Normal rate, regular rhythm, normal heart sounds and intact distal pulses.   Pulmonary/Chest: Effort normal and breath sounds normal. No respiratory distress. She has no wheezes. She has no rales. She exhibits no tenderness.  Abdominal: Soft. Bowel sounds are normal. She exhibits no distension. There is no tenderness.  Musculoskeletal: Normal range of motion. She exhibits no edema, tenderness or deformity.  No cervical, thoracic, or lumbar spine midline TTP.   Full ROM of bilateral upper and lower extremities with 5/5 strength.   2+ radial and DP pulses. Sensation grossly intact.   Mild swelling and erythema noted to right distal half of 4th toe. FROM of right toes and foot. No induration, fluctuance, drainage present. No evidence of felon or paronychia.   Neurological: She is alert and oriented to person, place, and time. She has normal strength. No cranial nerve deficit or sensory deficit. Coordination and gait normal.  Skin: Skin is warm and dry. She is not diaphoretic.  10cm linear laceration noted to left anterior shin, bleeding controlled s/p sutures that were placed by vascular surgery prior to my initial evaluation. No visible bone, tendons, vessels or foreign bodies noted.   Nursing note and vitals reviewed.   ED Course  .Marland KitchenLaceration Repair Date/Time: 08/24/2016 6:49 PM Performed by: Nona Dell Authorized by: Nona Dell   Consent:    Consent obtained:  Verbal   Consent given by:  Patient   Risks discussed:  Infection, pain, poor cosmetic result and poor wound healing Anesthesia (see MAR for exact dosages):    Anesthesia method:  Local infiltration   Local anesthetic:  Lidocaine 1% w/o epi Laceration details:    Location:  Leg   Leg location:  L lower leg   Length (cm):  10 Repair type:    Repair type:  Simple Pre-procedure details:    Preparation:  Patient was prepped and draped in usual sterile fashion Exploration:    Hemostasis achieved with:  Tied off vessels   Wound exploration: wound explored through full range of motion and entire depth of wound probed and visualized     Wound extent: no  foreign bodies/material noted, no muscle damage noted, no nerve damage noted, no tendon damage noted, no underlying fracture noted and no vascular damage noted     Contaminated: no   Treatment:    Area cleansed with:  Betadine and saline   Amount of cleaning:  Standard   Irrigation solution:  Sterile  saline   Irrigation method:  Syringe   Visualized foreign bodies/material removed: no   Skin repair:    Repair method:  Sutures and Steri-Strips   Suture size:  5-0   Suture material:  Prolene   Suture technique:  Simple interrupted   Number of sutures:  10   Number of Steri-Strips:  1 Approximation:    Approximation:  Close   Vermilion border: well-aligned   Post-procedure details:    Dressing:  Non-adherent dressing   Patient tolerance of procedure:  Tolerated well, no immediate complications    MDM Patient presents status post mechanical fall with associated head injury, left shin laceration and right fourth toe pain. Hx afib on coumadin. VSS. Exam performed by initial provider revealed no evidence of head injury, 10 cm laceration to left shin, bleeding controlled with pressure dressing, vascular surgery tied off bleeding vein in the ED, bleeding since controlled. Remaining exam unremarkable, no neuro deficits. Patient also reports having redness and swelling to her right fourth toe for the past few days and states she was scheduled to see her PCP today for suspected infection.  Labs unremarkable. Hemoglobin 11.7. INR 2.33. Left tib-fib x-ray unremarkable. CT head revealed probable meningioma of right frontal region with moderate vasogenic edema, MRI brain ordered for further evaluation.  On my initial valuation patient is resting comfortably in bed and denies any pain or complaints at this time. Laceration noted to left lower shin with no active bleeding. Bilateral large nasal vascular intact. Mild swelling and erythema noted to left fourth toe with full range of motion. No signs of head injury. No neuro deficits. Laceration repair completed without any complications. MR brain confirmed 2.3 cm meningioma. Discussed results of plan for discharge with patient. Plan to discharge patient home with wound care, information to follow-up with neurosurgery outpatient and antibiotics for suspected  toe infection. Advised patient to follow up with her primary care provider in 7 days for suture removal and wound follow-up regarding her toe. Discussed return precautions.       Chesley Noon [place], [place] 08/24/16 2144    Carmin Muskrat, MD 08/25/16 [phone]

## 2016-08-24 NOTE — Consult Note (Signed)
CONSULT NOTE    MRN : [id number]  Reason for Consult: Left LE bleeding Referring Physician: ED  History of Present Illness: [name] is a 82 y.o. ([birth year]) female brought to the ED with left leg injury and uncontrolled bleeding.  She feel and hit her shin on the lip of tiled stairs in her home.  PMH: A fib managed with coumadin, HTN not currently managed with medication,  and hyperlipidemia managed with Lipitor.  She has obvious venous disease with varicose veins and brawny skin discoloration.     No current facility-administered medications for this encounter.    Current Outpatient Prescriptions  Medication Sig Dispense Refill  . acetaminophen (TYLENOL) 500 MG tablet Take 500 mg by mouth every 6 (six) hours as needed. For pain    . acetaminophen (TYLENOL) 500 MG tablet Take 500 mg by mouth every 6 (six) hours as needed for mild pain.    Marland Kitchen alendronate (FOSAMAX) 70 MG tablet Take 70 mg by mouth once a week. Take with a full glass of water on an empty stomach.    . fexofenadine (ALLEGRA) 180 MG tablet Take 180 mg by mouth every morning.    . pantoprazole (PROTONIX) 40 MG tablet Take 40 mg by mouth every 12 (twelve) hours.    . potassium chloride (K-DUR) 10 MEQ tablet Take 10 mEq by mouth 2 (two) times daily.     . pravastatin (PRAVACHOL) 40 MG tablet Take 40 mg by mouth daily.    . sotalol (BETAPACE) 120 MG tablet Take 180 mg by mouth 2 (two) times daily.     Marland Kitchen venlafaxine (EFFEXOR) 75 MG tablet Take 75 mg by mouth daily.    . Vitamin D, Ergocalciferol, (DRISDOL) 50000 UNITS CAPS capsule Take 50,000 Units by mouth every 7 (seven) days. Wednesday    . warfarin (COUMADIN) 2.5 MG tablet Take 2.5 mg by mouth daily.     Marland Kitchen amoxicillin-clavulanate (AUGMENTIN) 875-125 MG per tablet Take 1 tablet by mouth every 12 (twelve) hours. (Patient not taking: Reported on 08/24/2016) 10 tablet 0  . atorvastatin (LIPITOR) 10 MG tablet Take 10 mg by mouth at bedtime.    Marland Kitchen doxycycline (VIBRAMYCIN)  100 MG capsule Take 1 capsule (100 mg total) by mouth 2 (two) times daily. (Patient not taking: Reported on 08/24/2016) 14 capsule 0  . HYDROcodone-acetaminophen (NORCO/VICODIN) 5-325 MG per tablet Take 1 tablet by mouth every 6 (six) hours as needed for moderate pain. (Patient not taking: Reported on 08/24/2016) 30 tablet 0  . methocarbamol (ROBAXIN) 500 MG tablet Take 1 tablet (500 mg total) by mouth every 6 (six) hours as needed for muscle spasms. (Patient not taking: Reported on 08/24/2016) 30 tablet 0  . polyethylene glycol (MIRALAX / GLYCOLAX) packet Take 17 g by mouth daily as needed for mild constipation. (Patient not taking: Reported on 08/24/2016) 14 each 0  . saccharomyces boulardii (FLORASTOR) 250 MG capsule Take 1 capsule (250 mg total) by mouth 2 (two) times daily. (Patient not taking: Reported on 08/24/2016)    . sucralfate (CARAFATE) 1 G tablet Take 1 g by mouth 4 (four) times daily -  with meals and at bedtime.      Pt meds include: Statin :Yes Betablocker: No ASA: No Other anticoagulants/antiplatelets: coumadin  Past Medical History:  Diagnosis Date  . A-fib ([place])    On chronic anticoagulation  . Allergy   . Dysrhythmia    hx of atrial fibrilation  . GERD (gastroesophageal reflux disease)   .  Headache(784.0)   . Hyperlipidemia   . Hypertension   . Osteoporosis   . Peripheral vascular disease ([place])   . Pneumonia   . PONV (postoperative nausea and vomiting)     Past Surgical History:  Procedure Laterality Date  . BUNIONECTOMY    . CARDIAC ELECTROPHYSIOLOGY MAPPING AND ABLATION    . FRACTURE SURGERY    . HEMIARTHROPLASTY HIP Right 04/08/2014  . HIP ARTHROPLASTY Right 04/08/2014   Procedure: ARTHROPLASTY BIPOLAR HIP;  Surgeon: Mcarthur Rossetti, MD;  Location: [facility];  Service: Orthopedics;  Laterality: Right;    Social History Social History  Substance Use Topics  . Smoking status: Former Research scientist (life sciences)  . Smokeless tobacco: Former Systems developer  . Alcohol use Yes      Comment: social    Family History Family History  Problem Relation Age of Onset  . Aneurysm Mother   . Tuberculosis Father     Allergies  Allergen Reactions  . Contrast Media [Iodinated Diagnostic Agents] Anaphylaxis  . Other Other (See Comments)    ivp dye. ivp dye, reported MRI contrast  . Amiodarone     Pulmonary edema  . Flecainide     Parkinson like raction  . Latex Itching and Swelling  . Multaq [Dronedarone Hydrochloride] Swelling  . Sulfa Antibiotics Other (See Comments)    Makes her sicker & it causes her to have dark rings around her eyes     REVIEW OF SYSTEMS  General: [ ]  Weight loss, [ ]  Fever, [ ]  chills Neurologic: [ ]  Dizziness, [ ]  Blackouts, [ ]  Seizure [ ]  Stroke, [ ]  "Mini stroke", [ ]  Slurred speech, [ ]  Temporary blindness; [ ]  weakness in arms or legs, [ ]  Hoarseness [ ]  Dysphagia Cardiac: [ ]  Chest pain/pressure, [ ]  Shortness of breath at rest [ ]  Shortness of breath with exertion, [ ]  Atrial fibrillation or irregular heartbeat  Vascular: [ ]  Pain in legs with walking, [ ]  Pain in legs at rest, [ ]  Pain in legs at night,  [ ]  Non-healing ulcer, [ ]  Blood clot in vein/DVT,   Pulmonary: [ ]  Home oxygen, [ ]  Productive cough, [ ]  Coughing up blood, [ ]  Asthma,  [ ]  Wheezing [ ]  COPD Musculoskeletal:  [ ]  Arthritis, [ ]  Low back pain, [ ]  Joint pain Hematologic: [x ] Easy Bruising, [ ]  Anemia; [ ]  Hepatitis [x]  anticoagulation on coumadin Gastrointestinal: [ ]  Blood in stool, [ ]  Gastroesophageal Reflux/heartburn, Urinary: [ ]  chronic Kidney disease, [ ]  on HD - [ ]  MWF or [ ]  TTHS, [ ]  Burning with urination, [ ]  Difficulty urinating Skin: [ ]  Rashes, [ ]  Wounds Psychological: [ ]  Anxiety, [ ]  Depression  Physical Examination Vitals:   08/24/16 1145 08/24/16 1321 08/24/16 1330 08/24/16 1345  BP:   140/86 156/95  Pulse: 68 (!) 55  65  Resp: 20 17 16    Temp:      TempSrc:      SpO2: 96% 100% 95% 96%   There is no height or weight on file to  calculate BMI.  General:  WDWN in NAD HENT: WNL Eyes: Pupils equal Pulmonary: normal non-labored breathing , without Rales, rhonchi,  wheezing Cardiac: RRR, without  Murmurs, rubs or gallops; No carotid bruits Abdomen: soft, NT, no masses Skin: no rashes, ulcers noted;  no Gangrene , no cellulitis; Superficial laceration left anterior shin  Venous bleeding, 5cm long x 0.5 cm wide.  Vascular Exam/Pulses:Palable radial and pedal pulses 2+  Musculoskeletal: no muscle wasting or atrophy; no edema  Neurologic: A&O X 3; Appropriate Affect ;  SENSATION: normal; MOTOR FUNCTION: 5/5 Symmetric Speech is fluent/normal   Significant Diagnostic Studies: CBC Lab Results  Component Value Date   WBC 6.4 08/24/2016   HGB 11.7 (L) 08/24/2016   HCT 37.2 08/24/2016   MCV 84.0 08/24/2016   PLT 190 08/24/2016    BMET    Component Value Date/Time   NA 141 08/24/2016 1125   K 4.3 08/24/2016 1125   CL 108 08/24/2016 1125   CO2 27 08/24/2016 1125   GLUCOSE 127 (H) 08/24/2016 1125   BUN 17 08/24/2016 1125   CREATININE 0.85 08/24/2016 1125   CALCIUM 8.9 08/24/2016 1125   GFRNONAA >60 08/24/2016 1125   GFRAA >60 08/24/2016 1125   CrCl cannot be calculated (Unknown ideal weight.).  COAG Lab Results  Component Value Date   INR 2.33 08/24/2016   INR 3.09 (H) 04/11/2014   INR 2.36 (H) 04/10/2014     Non-Invasive Vascular Imaging:  Left Tib/fib x ray IMPRESSION: No fracture or dislocation. Areas of atherosclerotic calcification noted.     ASSESSMENT/PLAN:  Venous insufficieny with traumatic left anterior shin laceration with injured varicose vein  Under sterile conditions Dr. Thornell Mule the area of bleeding with betadine.  He draped the area with sterile towels and tided off the vein with 3.0 vicryl stitch.   The bleeding subsided.    F/U for [facility] for bleeding varicosity for possible sclero  therapy or ablation.   Laurence Slate [facility] 08/24/2016  2:46 PM    Addendum  I have independently interviewed and examined the patient, and I agree with the physician assistant's findings.  This patient traumatic lacerated her left pre-tibia lower leg.  In the process, she injured a varicose vein which was was actively bleeding.  I tried to compress the varicosity and apply Avitene first, but this failed to control the varicosity.  I sterilely cleaned the skin and subcutaneous tissue around the bleeding varicosities and I oversewed the bleeding segment of varicose vein with a 4-0 Vicryl.    - Defer to ED closure of the laceration - The patient can follow up with my partners in 4 weeks in the [facility] for routine evaluation for sclerotherapy vs stab phlebectomy of the left lower leg varcosities.  Adele Barthel, MD, FACS [facility] Office: [phone] Pager: [phone]  08/24/2016, 3:14 PM

## 2016-08-24 NOTE — ED Provider Notes (Signed)
[facility] DEPT Provider Note   CSN: [id number] Arrival date & time: 08/24/16  1031     History   Chief Complaint Chief Complaint  Patient presents with  . Fall    HPI [name] is a 82 y.o. female.  Patient is 82 yo F with PMH of atrial fibrillation anticoagulated on Coumadin, presenting from home after mechanical trip and fall just PTA  in which she hit her head and sustained a laceration to her left shin. She states she has a sore right 4th toe and has had trouble planting with her right leg due to pain, which caused her to become unsteady as she walked up one step into her living room. She then fell and slid across the tile floor, bumping her head and cutting her left shin. She denies any LOC, headache, dizziness, numbness, weakness, or vision changes. She states she lost large amount of blood, but denies any chest pain, palpitations, or shortness of breath. She was unable to control the bleeding and therefore called EMS. She reports her baseline INR is 2.5.      Past Medical History:  Diagnosis Date  . A-fib ([place])    On chronic anticoagulation  . Allergy   . Dysrhythmia    hx of atrial fibrilation  . GERD (gastroesophageal reflux disease)   . Headache(784.0)   . Hyperlipidemia   . Hypertension   . Osteoporosis   . Peripheral vascular disease ([place])   . Pneumonia   . PONV (postoperative nausea and vomiting)     Patient Active Problem List   Diagnosis Date Noted  . Acute blood loss anemia 04/12/2014  . Allergic rhinitis 04/12/2014  . Depression 04/12/2014  . Aspiration pneumonia ([place]) 04/10/2014  . Hip fracture ([place]) 04/08/2014  . Fracture of femoral neck, right ([place]) 04/08/2014  . Hyperlipidemia   . Osteoporosis   . Hypertension   . A-fib [facility])     Past Surgical History:  Procedure Laterality Date  . BUNIONECTOMY    . CARDIAC ELECTROPHYSIOLOGY MAPPING AND ABLATION    . FRACTURE SURGERY    . HEMIARTHROPLASTY HIP Right 04/08/2014  . HIP  ARTHROPLASTY Right 04/08/2014   Procedure: ARTHROPLASTY BIPOLAR HIP;  Surgeon: Mcarthur Rossetti, MD;  Location: [facility];  Service: Orthopedics;  Laterality: Right;    OB History    No data available       Home Medications    Prior to Admission medications   Medication Sig Start Date End Date Taking? Authorizing Provider  acetaminophen (TYLENOL) 500 MG tablet Take 500 mg by mouth every 6 (six) hours as needed. For pain   Yes Historical Provider, MD  acetaminophen (TYLENOL) 500 MG tablet Take 500 mg by mouth every 6 (six) hours as needed for mild pain.   Yes Historical Provider, MD  alendronate (FOSAMAX) 70 MG tablet Take 70 mg by mouth once a week. Take with a full glass of water on an empty stomach.   Yes Historical Provider, MD  fexofenadine (ALLEGRA) 180 MG tablet Take 180 mg by mouth every morning.   Yes Historical Provider, MD  pantoprazole (PROTONIX) 40 MG tablet Take 40 mg by mouth every 12 (twelve) hours.   Yes Historical Provider, MD  potassium chloride (K-DUR) 10 MEQ tablet Take 10 mEq by mouth 2 (two) times daily.    Yes Historical Provider, MD  pravastatin (PRAVACHOL) 40 MG tablet Take 40 mg by mouth daily.   Yes Historical Provider, MD  sotalol (BETAPACE) 120 MG tablet Take  180 mg by mouth 2 (two) times daily.    Yes Historical Provider, MD  venlafaxine (EFFEXOR) 75 MG tablet Take 75 mg by mouth daily.   Yes Historical Provider, MD  Vitamin D, Ergocalciferol, (DRISDOL) 50000 UNITS CAPS capsule Take 50,000 Units by mouth every 7 (seven) days. Wednesday   Yes Historical Provider, MD  warfarin (COUMADIN) 2.5 MG tablet Take 2.5 mg by mouth daily.    Yes Historical Provider, MD  amoxicillin-clavulanate (AUGMENTIN) 875-125 MG per tablet Take 1 tablet by mouth every 12 (twelve) hours. Patient not taking: Reported on 08/24/2016 04/11/14   Jonetta Osgood, MD  atorvastatin (LIPITOR) 10 MG tablet Take 10 mg by mouth at bedtime.    Historical Provider, MD  doxycycline (VIBRAMYCIN) 100  MG capsule Take 1 capsule (100 mg total) by mouth 2 (two) times daily. Patient not taking: Reported on 08/24/2016 04/17/15   Gean Birchwood, DPM  HYDROcodone-acetaminophen (NORCO/VICODIN) 5-325 MG per tablet Take 1 tablet by mouth every 6 (six) hours as needed for moderate pain. Patient not taking: Reported on 08/24/2016 04/11/14   Jonetta Osgood, MD  methocarbamol (ROBAXIN) 500 MG tablet Take 1 tablet (500 mg total) by mouth every 6 (six) hours as needed for muscle spasms. Patient not taking: Reported on 08/24/2016 04/11/14   Jonetta Osgood, MD  polyethylene glycol [facility] Floria Raveling) packet Take 17 g by mouth daily as needed for mild constipation. Patient not taking: Reported on 08/24/2016 04/11/14   Jonetta Osgood, MD  saccharomyces boulardii (FLORASTOR) 250 MG capsule Take 1 capsule (250 mg total) by mouth 2 (two) times daily. Patient not taking: Reported on 08/24/2016 04/11/14   Jonetta Osgood, MD  sucralfate (CARAFATE) 1 G tablet Take 1 g by mouth 4 (four) times daily -  with meals and at bedtime.    Historical Provider, MD    Family History Family History  Problem Relation Age of Onset  . Aneurysm Mother   . Tuberculosis Father     Social History Social History  Substance Use Topics  . Smoking status: Former Research scientist (life sciences)  . Smokeless tobacco: Former Systems developer  . Alcohol use Yes     Comment: social     Allergies   Contrast media [iodinated diagnostic agents]; Other; Amiodarone; Flecainide; Latex; Multaq [dronedarone hydrochloride]; and Sulfa antibiotics   Review of Systems Review of Systems  Constitutional: Negative for chills and fever.  HENT: Negative for facial swelling.   Eyes: Negative for pain and visual disturbance.  Respiratory: Negative for cough and shortness of breath.   Cardiovascular: Negative for chest pain, palpitations and leg swelling.  Gastrointestinal: Negative for abdominal pain, blood in stool, nausea and vomiting.  Genitourinary: Negative for dysuria,  flank pain and hematuria.  Musculoskeletal: Positive for gait problem (due to toe pain). Negative for back pain and neck pain.  Skin: Positive for wound. Negative for color change.  Neurological: Negative for dizziness, seizures, syncope, weakness, numbness and headaches.  Hematological: Bruises/bleeds easily (anticoagulated on Coumadin).     Physical Exam Updated Vital Signs BP 139/99   Pulse 70   Temp 98.5 F (36.9 C) (Oral)   Resp 19   SpO2 98%   Physical Exam  Constitutional: She is oriented to person, place, and time. She appears well-developed and well-nourished.  Pleasant, elderly female, pressure dressing applied to left anterior shin with dried blood noted to BLE, no acute distress.  HENT:  Head: Normocephalic and atraumatic.  Mouth/Throat: Oropharynx is clear and moist.  No Raccoon's  eyes, Battle's sign, abrasion, or contusion noted to head. No hemotympanum, external ears normal bilaterally. No nasal deformity. No frontal or maxillary sinus tenderness. Dentition normal, no malocclusion.  Eyes: Conjunctivae and EOM are normal. Pupils are equal, round, and reactive to light.  Neck: Normal range of motion. Neck supple.  No midline cervical tenderness, crepitus, or deformity. No TTP of paraspinal or trapezius musculature.  Cardiovascular: Normal rate, regular rhythm, normal heart sounds and intact distal pulses.   Strong DP and PT pulses bilaterally.  Pulmonary/Chest: Effort normal and breath sounds normal. No respiratory distress.  Abdominal: Soft. There is no tenderness.  Musculoskeletal: Normal range of motion. She exhibits no edema or tenderness.  Bilateral hips with FROM. No limb length discrepancy or abnormal rotation. No pain with log roll testing. Strength and sensation grossly intact, distal pulses intact, compartments soft.  Neurological: She is alert and oriented to person, place, and time.  Speech is clear and goal oriented, follows commands. Cranial nerves  III - XII without deficit, no facial droop. Normal strength in upper and lower extremities bilaterally, strong and equal grip strength. Sensation normal to light and sharp touch. Moves extremities without ataxia, coordination intact. Normal finger to nose and rapid alternating movements. Negative Romberg, no pronator drift.  Skin: Skin is warm and dry.  10 cm laceration noted to left anterior shift. Bleeding controlled by pressure dressing, but upon removal, profuse stream of blood was present. Mild swelling and erythema noted to distal half of right 4th toe. FROM right toes and foot. No fluctuance or drainage.  Psychiatric: She has a normal mood and affect.  Nursing note and vitals reviewed.    ED Treatments / Results  Labs (all labs ordered are listed, but only abnormal results are displayed) Labs Reviewed  BASIC METABOLIC PANEL  CBC WITH DIFFERENTIAL/PLATELET  PROTIME-INR    EKG  EKG Interpretation None       Radiology No results found.  Procedures Procedures (including critical care time)  Medications Ordered in ED Medications - No data to display   Initial Impression / Assessment and Plan / ED Course  I have reviewed the triage vital signs and the nursing notes.  Pertinent labs & imaging results that were available during my care of the patient were reviewed by me and considered in my medical decision making (see chart for details).  Clinical Course    Patient is 82 yo F with PMH of atrial fibrillation anticoagulated on Coumadin, presenting from home after mechanical trip and fall in which she hit her head and sustained a laceration to her left anterior shin. On arrival in ED, bleeding was well controlled by pressure dressing, but upon removal, profuse stream of venous blood was noted. Vascular surgery was consulted to control bleeding, and Dr. Adele Barthel tied off the vein with 3.0 vicryl stitch. X-ray left tib/fib shows no fracture or foreign body, and laceration  repair was performed by Harlene Ramus, PA-C, who assumed care for the patient at shift change.  She has no evidence of head trauma and normal neuro exam, but CT head shows incidental finding of probable meningioma in right frontal region with vasogenic edema. Given findings, MRI brain without contrast ordered. Harlene Ramus agreed to update patient and daughter with results of MRI, and refer patient to outpatient neurosurgeon for reevaluation. Patient stable to f/u with her PCP for suture removal and reevaluation of possible infection of right 4th toe. Patient will receive doxycycline empirically for possible toe infection. Harlene Ramus agreed to  plan and will provide discharge instructions accordingly.  Final Clinical Impressions(s) / ED Diagnoses   Final diagnoses:  Fall, initial encounter  Laceration of left lower extremity, initial encounter  Pain of toe of right foot    New Prescriptions New Prescriptions   No medications on file     Rosilyn Mings II, [place] 08/24/16 2046    Gwenyth Allegra Tegeler, MD 08/24/16 2138

## 2016-08-24 NOTE — ED Notes (Signed)
Pharmacy contacted about sending lidocaine down as soon as possible

## 2016-08-24 NOTE — ED Notes (Signed)
Patient requesting different abx than keflex upon discharge stating that this abx gives her Cdiff, this RN will make PA Nadeau aware of patient's request.

## 2016-08-24 NOTE — ED Notes (Signed)
Patient transported to X-ray 

## 2016-08-24 NOTE — ED Notes (Signed)
Patient transported to CT 

## 2016-08-26 ENCOUNTER — Encounter: Payer: Self-pay | Admitting: Vascular Surgery

## 2016-09-01 ENCOUNTER — Ambulatory Visit (INDEPENDENT_AMBULATORY_CARE_PROVIDER_SITE_OTHER): Payer: Medicare Other | Admitting: Vascular Surgery

## 2016-09-01 ENCOUNTER — Encounter: Payer: Self-pay | Admitting: Vascular Surgery

## 2016-09-01 VITALS — BP 131/79 | HR 63 | Temp 97.2°F | Resp 16 | Ht 68.0 in | Wt 164.0 lb

## 2016-09-01 DIAGNOSIS — I83891 Varicose veins of right lower extremities with other complications: Secondary | ICD-10-CM | POA: Diagnosis not present

## 2016-09-01 NOTE — Progress Notes (Signed)
Subjective:     Patient ID: [name], female   DOB: 24-Jun-1928, 82 y.o.   MRN: [id number]  HPI this 82 year old female who has chronic atrial fib and is on Coumadin therapy fell on November 13 and lacerated her left pretibial region. She went to the emergency department. There was some prominent venous bleeding and Dr. Adele Barthel control this with sutures. The physician assistant in the emergency department then closed the laceration and the patient was referred to the vein center today for evaluation of possible venous disease which might require sclerotherapy or other treatment. Patient has a remote history of a left leg vein stripping by Dr. Burgess Amor 50+ years ago. Patient has had no complications from her varicose veins since that time. She has no history of spontaneous ulceration bleeding but does have some swelling as the day progresses. She does not relate compression stockings.  Past Medical History:  Diagnosis Date  . A-fib ([place])    On chronic anticoagulation  . Allergy   . Dysrhythmia    hx of atrial fibrilation  . GERD (gastroesophageal reflux disease)   . Headache(784.0)   . Hyperlipidemia   . Hypertension   . Osteoporosis   . Peripheral vascular disease ([place])   . Pneumonia   . PONV (postoperative nausea and vomiting)     Social History  Substance Use Topics  . Smoking status: Former Research scientist (life sciences)  . Smokeless tobacco: Former Systems developer  . Alcohol use Yes     Comment: social    Family History  Problem Relation Age of Onset  . Aneurysm Mother   . Tuberculosis Father     Allergies  Allergen Reactions  . Contrast Media [Iodinated Diagnostic Agents] Anaphylaxis  . Dronedarone Other (See Comments)    CHF  . Other Other (See Comments)    She is allergic to MRI contrast media per daughter Pt states she is allergic to heart medications, can not recall names/ steroid injections ivp dye. ivp dye, reported MRI contrast  . Amiodarone     Pulmonary edema  . Flecainide    Parkinson like raction  . Latex Itching and Swelling  . Multaq [Dronedarone Hydrochloride] Swelling  . Sulfa Antibiotics Other (See Comments)    Makes her sicker & it causes her to have dark rings around her eyes     Current Outpatient Prescriptions:  .  acetaminophen (TYLENOL) 500 MG tablet, Take 500 mg by mouth every 6 (six) hours as needed. For pain, Disp: , Rfl:  .  acetaminophen (TYLENOL) 500 MG tablet, Take 500 mg by mouth every 6 (six) hours as needed for mild pain., Disp: , Rfl:  .  alendronate (FOSAMAX) 70 MG tablet, Take 70 mg by mouth once a week. Take with a full glass of water on an empty stomach., Disp: , Rfl:  .  atorvastatin (LIPITOR) 10 MG tablet, Take 10 mg by mouth at bedtime., Disp: , Rfl:  .  doxycycline (VIBRAMYCIN) 100 MG capsule, Take 1 capsule (100 mg total) by mouth 2 (two) times daily., Disp: 20 capsule, Rfl: 0 .  fexofenadine (ALLEGRA) 180 MG tablet, Take 180 mg by mouth every morning., Disp: , Rfl:  .  HYDROcodone-acetaminophen (NORCO/VICODIN) 5-325 MG per tablet, Take 1 tablet by mouth every 6 (six) hours as needed for moderate pain., Disp: 30 tablet, Rfl: 0 .  methocarbamol (ROBAXIN) 500 MG tablet, Take 1 tablet (500 mg total) by mouth every 6 (six) hours as needed for muscle spasms., Disp: 30 tablet, Rfl: 0 .  pantoprazole (PROTONIX) 40 MG tablet, Take 40 mg by mouth every 12 (twelve) hours., Disp: , Rfl:  .  polyethylene glycol (MIRALAX / GLYCOLAX) packet, Take 17 g by mouth daily as needed for mild constipation., Disp: 14 each, Rfl: 0 .  potassium chloride (K-DUR) 10 MEQ tablet, Take 10 mEq by mouth 2 (two) times daily. , Disp: , Rfl:  .  pravastatin (PRAVACHOL) 40 MG tablet, Take 40 mg by mouth daily., Disp: , Rfl:  .  saccharomyces boulardii (FLORASTOR) 250 MG capsule, Take 1 capsule (250 mg total) by mouth 2 (two) times daily., Disp: , Rfl:  .  sotalol (BETAPACE) 120 MG tablet, Take 180 mg by mouth 2 (two) times daily. , Disp: , Rfl:  .  sucralfate  (CARAFATE) 1 G tablet, Take 1 g by mouth 4 (four) times daily -  with meals and at bedtime., Disp: , Rfl:  .  venlafaxine (EFFEXOR) 75 MG tablet, Take 75 mg by mouth daily., Disp: , Rfl:  .  Vitamin D, Ergocalciferol, (DRISDOL) 50000 UNITS CAPS capsule, Take 50,000 Units by mouth every 7 (seven) days. Wednesday, Disp: , Rfl:  .  warfarin (COUMADIN) 2.5 MG tablet, Take 2.5 mg by mouth daily. , Disp: , Rfl:   Vitals:   09/01/16 1057  BP: 131/79  Pulse: 63  Resp: 16  Temp: 97.2 F (36.2 C)  SpO2: 97%  Weight: 164 lb (74.4 kg)  Height: 5\' 8"  (1.727 m)    Body mass index is 24.94 kg/m.          Review of Systems patient has history of hypertension, hyperlipidemia, GERD, chronic atrial fibrillation on chronic Coumadin therapy.-See history of present illness    Objective:   Physical Exam BP 131/79 (BP Location: Left Arm, Patient Position: Sitting, Cuff Size: Normal)   Pulse 63   Temp 97.2 F (36.2 C)   Resp 16   Ht 5\' 8"  (1.727 m)   Wt 164 lb (74.4 kg)   SpO2 97%   BMI 24.94 kg/m   Gen. elderly female no apparent distress alert and oriented 3 Lungs no rhonchi or wheezing Left lower extremity with recently sutured laceration about 10 cm in length in the pretibial region with some skin edge necrosis. No evidence of cellulitis. Sutures are in place as well as some transverse Steri-Strips applied by patient's daughter who is a Marine scientist. 3+ dorsalis pedis pulse and posterior tibial pulse palpable. A few varicosities noted in the medial thigh and medial calf. Hyperpigmentation lower third left leg.  Today I performed a independent sono site bedside ultrasound exam which reveals no significant left great saphenous vein remaining either in the thigh or calf region with some intermittent varicosities noted in the calf     Assessment:     Status post traumatic laceration left pretibial region requiring ligation of venous bleeding by Dr. Adele Barthel and closure of the laceration by the  emergency room physician assistant No evidence of significant arterial insufficiency left leg No evidence of need for treatment of venous disease    Plan:     #1 recommend wound care per patient's daughter keeping wound clean and covered #2 would recommend removing sutures in about one week either in emergency room where sutures were placed or medical doctor's office #3 if wound healing does not occur patient should be referred to the wound center for treatment #4 no need for arterial or venous intervention or follow-up or further evaluation #5 wound may require 6-8 weeks to heal because  of its location in the character and quality of the skin in the pretibial region and the age of the patient

## 2016-09-14 DIAGNOSIS — D329 Benign neoplasm of meninges, unspecified: Secondary | ICD-10-CM | POA: Insufficient documentation

## 2016-09-16 ENCOUNTER — Encounter (HOSPITAL_BASED_OUTPATIENT_CLINIC_OR_DEPARTMENT_OTHER): Payer: Medicare Other | Attending: Surgery

## 2016-09-16 DIAGNOSIS — Z87891 Personal history of nicotine dependence: Secondary | ICD-10-CM | POA: Diagnosis not present

## 2016-09-16 DIAGNOSIS — E785 Hyperlipidemia, unspecified: Secondary | ICD-10-CM | POA: Diagnosis not present

## 2016-09-16 DIAGNOSIS — I10 Essential (primary) hypertension: Secondary | ICD-10-CM | POA: Diagnosis not present

## 2016-09-16 DIAGNOSIS — F329 Major depressive disorder, single episode, unspecified: Secondary | ICD-10-CM | POA: Diagnosis not present

## 2016-09-16 DIAGNOSIS — I4891 Unspecified atrial fibrillation: Secondary | ICD-10-CM | POA: Insufficient documentation

## 2016-09-16 DIAGNOSIS — Z79899 Other long term (current) drug therapy: Secondary | ICD-10-CM | POA: Insufficient documentation

## 2016-09-16 DIAGNOSIS — L97822 Non-pressure chronic ulcer of other part of left lower leg with fat layer exposed: Secondary | ICD-10-CM | POA: Diagnosis not present

## 2016-09-16 DIAGNOSIS — Z7901 Long term (current) use of anticoagulants: Secondary | ICD-10-CM | POA: Insufficient documentation

## 2016-09-16 DIAGNOSIS — Z79891 Long term (current) use of opiate analgesic: Secondary | ICD-10-CM | POA: Insufficient documentation

## 2016-09-22 ENCOUNTER — Ambulatory Visit: Payer: Medicare Other | Admitting: Podiatry

## 2016-09-22 DIAGNOSIS — L97822 Non-pressure chronic ulcer of other part of left lower leg with fat layer exposed: Secondary | ICD-10-CM | POA: Diagnosis not present

## 2016-09-30 DIAGNOSIS — L97822 Non-pressure chronic ulcer of other part of left lower leg with fat layer exposed: Secondary | ICD-10-CM | POA: Diagnosis not present

## 2016-10-07 DIAGNOSIS — L97822 Non-pressure chronic ulcer of other part of left lower leg with fat layer exposed: Secondary | ICD-10-CM | POA: Diagnosis not present

## 2017-01-29 DIAGNOSIS — D649 Anemia, unspecified: Secondary | ICD-10-CM | POA: Insufficient documentation

## 2017-07-29 DIAGNOSIS — L97511 Non-pressure chronic ulcer of other part of right foot limited to breakdown of skin: Secondary | ICD-10-CM | POA: Insufficient documentation

## 2017-10-27 DIAGNOSIS — R350 Frequency of micturition: Secondary | ICD-10-CM | POA: Insufficient documentation

## 2018-02-02 DIAGNOSIS — M170 Bilateral primary osteoarthritis of knee: Secondary | ICD-10-CM | POA: Insufficient documentation

## 2018-04-27 DIAGNOSIS — N390 Urinary tract infection, site not specified: Secondary | ICD-10-CM | POA: Insufficient documentation

## 2018-05-03 DIAGNOSIS — J209 Acute bronchitis, unspecified: Secondary | ICD-10-CM | POA: Insufficient documentation

## 2018-10-25 DIAGNOSIS — R197 Diarrhea, unspecified: Secondary | ICD-10-CM | POA: Insufficient documentation

## 2018-11-22 ENCOUNTER — Other Ambulatory Visit: Payer: Self-pay

## 2018-11-22 MED ORDER — WARFARIN SODIUM 2.5 MG PO TABS: 2.5000 mg | ORAL_TABLET | Freq: Every day | ORAL | 1 refills | Status: DC

## 2018-11-23 ENCOUNTER — Other Ambulatory Visit: Payer: Self-pay

## 2018-11-23 MED ORDER — WARFARIN SODIUM 2.5 MG PO TABS: 2.5000 mg | ORAL_TABLET | Freq: Every day | ORAL | 1 refills | Status: DC

## 2019-02-14 DIAGNOSIS — I110 Hypertensive heart disease with heart failure: Secondary | ICD-10-CM | POA: Insufficient documentation

## 2019-02-14 DIAGNOSIS — R296 Repeated falls: Secondary | ICD-10-CM | POA: Insufficient documentation

## 2019-02-15 ENCOUNTER — Other Ambulatory Visit: Payer: Self-pay | Admitting: Internal Medicine

## 2019-02-15 ENCOUNTER — Other Ambulatory Visit: Payer: Self-pay

## 2019-02-15 ENCOUNTER — Ambulatory Visit: Payer: Self-pay | Admitting: Cardiology

## 2019-02-15 DIAGNOSIS — R296 Repeated falls: Secondary | ICD-10-CM

## 2019-02-15 DIAGNOSIS — I480 Paroxysmal atrial fibrillation: Secondary | ICD-10-CM

## 2019-02-15 MED ORDER — SOTALOL HCL 120 MG PO TABS: 180.0000 mg | ORAL_TABLET | Freq: Two times a day (BID) | ORAL | 0 refills | Status: DC

## 2019-03-02 ENCOUNTER — Ambulatory Visit
Admission: RE | Admit: 2019-03-02 | Discharge: 2019-03-02 | Disposition: A | Discharge status: Home / Self Care | Payer: Medicare Other | Source: Ambulatory Visit | Attending: Internal Medicine | Admitting: Internal Medicine

## 2019-03-02 DIAGNOSIS — R296 Repeated falls: Secondary | ICD-10-CM

## 2019-04-10 ENCOUNTER — Other Ambulatory Visit: Payer: Self-pay | Admitting: Cardiology

## 2019-04-10 DIAGNOSIS — I480 Paroxysmal atrial fibrillation: Secondary | ICD-10-CM

## 2019-04-10 NOTE — Telephone Encounter (Signed)
Patient needs appt for future refills

## 2019-04-10 NOTE — Telephone Encounter (Signed)
Please fill

## 2019-04-12 ENCOUNTER — Ambulatory Visit: Payer: Self-pay | Admitting: Cardiology

## 2019-04-25 ENCOUNTER — Encounter: Payer: Self-pay | Admitting: Cardiology

## 2019-04-25 ENCOUNTER — Other Ambulatory Visit: Payer: Self-pay

## 2019-04-25 ENCOUNTER — Ambulatory Visit (INDEPENDENT_AMBULATORY_CARE_PROVIDER_SITE_OTHER): Payer: Medicare Other | Admitting: Cardiology

## 2019-04-25 VITALS — BP 141/90 | HR 64 | Ht 68.0 in | Wt 158.0 lb

## 2019-04-25 DIAGNOSIS — I10 Essential (primary) hypertension: Secondary | ICD-10-CM | POA: Diagnosis not present

## 2019-04-25 DIAGNOSIS — I480 Paroxysmal atrial fibrillation: Secondary | ICD-10-CM

## 2019-04-25 MED ORDER — AMLODIPINE BESYLATE 5 MG PO TABS: 5.0000 mg | ORAL_TABLET | Freq: Every day | ORAL | 2 refills | Status: DC

## 2019-04-25 NOTE — Progress Notes (Signed)
Primary Physician/Referring:  Kimberly Solian, MD  Patient ID: [name], female    DOB: 08-06-1928, 85 y.o.   MRN: [id number]  Subjective   Chief Complaint  Patient presents with  . PAF  . Follow-up    74mo   HPI: [name] is a 85y.o. female  paroxysmal atrial fibrillation who has had atrial fibrillation ablation x 2. She had maintained sinus rhythm on high dose sotalol in spite of her age needing higher doses. She experiences extreme fatigue and shortness of breath with A. Fib onset. She has occasional breakthrough A. Fib.  Patient is here on 6 month office visit and follow-up for paroxysmal atrial fibrillation. She continues to have occasional breakthrough episodes of atrial fibrillation and mostly is able to tolerate these episodes. No bleeding diathesis or dark, tarry stools. Coumadin is being managed by Dr. ADagmar Hait She also has orthostatic hypotension and has very mild chronic dizziness. She denies any chest pain, shortness of breath. No new symptoms. No recent fall. No malena.  Past Medical History:  Diagnosis Date  . [place]    On chronic anticoagulation  . Allergy   . Dysrhythmia    hx of atrial fibrilation  . GERD (gastroesophageal reflux disease)   . Headache(784.0)   . Hyperlipidemia   . Hypertension   . Osteoporosis   . [place] ([place]   . Pneumonia   . PONV (postoperative nausea and vomiting)     Past Surgical History:  Procedure Laterality Date  . BUNIONECTOMY    . CARDIAC ELECTROPHYSIOLOGY MAPPING AND ABLATION    . FRACTURE SURGERY    . HEMIARTHROPLASTY HIP Right 04/08/2014  . HIP ARTHROPLASTY Right 04/08/2014   Procedure: ARTHROPLASTY BIPOLAR HIP;  Surgeon: CMcarthur Rossetti MD;  [place] [facility]  Service: Orthopedics;  Laterality: Right;    Social History   Socioeconomic History  . Marital status: Widowed    Spouse name: Not on file  . Number of children: 2  . Years of education: Not on file  . Highest  education level: Not on file  Occupational History  . Not on file  Social Needs  . Financial resource strain: Not on file  . Food insecurity    Worry: Not on file    Inability: Not on file  . Transportation needs    Medical: Not on file    Non-medical: Not on file  Tobacco Use  . Smoking status: Former Smoker    Packs/day: 0.50    Years: 4.00    Pack years: 2.00    Types: Cigarettes    Quit date: 1952    Years since quitting: 68.5  . Smokeless tobacco: Former UNetwork engineerand Sexual Activity  . Alcohol use: Yes    Comment: occas wine  . Drug use: No  . Sexual activity: Yes  Lifestyle  . Physical activity    Days per week: Not on file    Minutes per session: Not on file  . Stress: Not on file  Relationships  . Social [place] phone: Not on file    Gets together: Not on file    Attends religious service: Not on file    Active member of club or organization: Not on file    Attends meetings of clubs or organizations: Not on file    Relationship status: Not on file  . Intimate partner violence    Fear of current or ex partner: Not  on file    Emotionally abused: Not on file    Physically abused: Not on file    Forced sexual activity: Not on file  Other Topics Concern  . Not on file  Social History Narrative  . Not on file   Review of Systems  Constitution: Negative for chills, decreased appetite, malaise/fatigue and weight gain.  Cardiovascular: Negative for dyspnea on exertion, leg swelling and syncope.  Endocrine: Negative for cold intolerance.  Hematologic/Lymphatic: Does not bruise/bleed easily.  Musculoskeletal: Positive for arthritis. Negative for joint swelling.  Gastrointestinal: Negative for abdominal pain, anorexia, change in bowel habit, hematochezia and melena.  Neurological: Positive for dizziness (occasional). Negative for headaches and light-headedness.  Psychiatric/Behavioral: Negative for depression and substance abuse.  All other  systems reviewed and are negative.  Objective  Blood pressure (!) 141/90, pulse 64, SpO2 96 %. There is no height or weight on file to calculate BMI.   Physical Exam  Constitutional: She appears well-developed and well-nourished. No distress.  HENT:  Head: Atraumatic.  Eyes: Conjunctivae are normal.  Neck: Neck supple. No JVD present. No thyromegaly present.  Cardiovascular: Normal rate, regular rhythm and normal heart sounds. Exam reveals no gallop.  No murmur heard. She is mild bilateral superficial varicose veins in the lower extremities.  Carotid pulses are normal, except for 1+ pedal pulses, vascular exam is normal.  No leg edema.  Pulmonary/Chest: Effort normal and breath sounds normal.  Abdominal: Soft. Bowel sounds are normal.  Musculoskeletal: Normal range of motion.  Neurological: She is alert.  Skin: Skin is warm and dry.  Psychiatric: She has a normal mood and affect.   Radiology: No results found.  Laboratory examination:   Labs 01/30/19/20: Serum glucose 16 mg, BUN 20, creatinine 0.8, eGFR 67 mL, potassium 4.6, CMP otherwise normal.  HB 14.2/HCT 46.7, platelets 154, normal indicis.  Total cholesterol 157, triglycerides 67, HDL 59, LDL 85.  TSH normal.  Vitamin D 46.  INR 1.9.  A1c 5.7%.  CMP Latest Ref Rng & Units 08/24/2016 04/10/2014 04/09/2014  Glucose 65 - 99 mg/dL 127(H) 144(H) 148(H)  BUN 6 - 20 mg/dL _0 Creatinine 0.44 - 1.00 mg/dL 0.85 0.80 0.90  Sodium 135 - 145 mmol/L 141 137 140  Potassium 3.5 - 5.1 mmol/L 4.3 4.6 4.4  Chloride 101 - 111 mmol/L 108 101 103  CO2 22 - 32 mmol/L _1 Calcium 8.9 - 10.3 mg/dL 8.9 8.4 8.1(L)  Total Protein 6.0 - 8.3 g/dL - - -  Total Bilirubin 0.3 - 1.2 mg/dL - - -  Alkaline Phos 39 - 117 U/L - - -  AST 0 - 37 U/L - - -  ALT 0 - 35 U/L - - -   CBC Latest Ref Rng & Units 08/24/2016 04/11/2014 04/10/2014  WBC 4.0 - 10.5 K/uL 6.4 6.8 7.1  Hemoglobin 12.0 - 15.0 g/dL 11.7(L) 10.3(L) 10.7(L)  Hematocrit 36.0 - 46.0  % 37.2 31.7(L) 33.2(L)  Platelets 150 - 400 K/uL 190 122(L) 120(L)   Lipid Panel  No results found for: CHOL, TRIG, HDL, CHOLHDL, VLDL, LDLCALC, LDLDIRECT HEMOGLOBIN A1C No results found for: HGBA1C, MPG TSH No results for input(s): TSH in the last 8760 hours.  Medications   Current Outpatient Medications  Medication Instructions  . acetaminophen (TYLENOL) 500 mg, Oral, Every 6 hours PRN  . amLODipine (NORVASC) 5 mg, Oral, Daily  . EPINEPHrine 0.3 mg/0.3 mL IJ SOAJ injection Intramuscular  . fexofenadine (ALLEGRA) 180 mg, Oral, Daily  PRN  . metoprolol tartrate (LOPRESSOR) 25 MG tablet Oral, 2 times daily  . potassium chloride (K-DUR) 10 MEQ tablet 10 mEq, 2 times daily  . pravastatin (PRAVACHOL) 40 mg, Oral, Daily  . risedronate (ACTONEL) 35 MG tablet Every Sunday morn  . sotalol (BETAPACE) 180 mg, Oral, 2 times daily  . traMADol (ULTRAM) 50 mg, Oral, Every 8 hours PRN, for pain  . venlafaxine (EFFEXOR) 75 mg, Daily  . Vitamin D (Ergocalciferol) (DRISDOL) 50,000 Units, Every 7 days  . warfarin (COUMADIN) 2.5 mg, Oral, Daily   Cardiac Studies:    Echocardiogram 02/13/2016: Left ventricle cavity is normal in size. Mild concentric hypertrophy of the left ventricle. Normal global wall motion. Doppler evidence of grade I (impaired) diastolic dysfunction. Calculated EF 60%. Left atrial cavity is severely dilated at 5.0 cm. Right atrial cavity is mildly dilated. Moderate aortic regurgitation. Mild mitral regurgitation. Moderate tricuspid regurgitation. Mild to moderate pulmonary hypertension. Pulmonary artery systolic pressure is estimated at 39 mm Hg. Mild pulmonic regurgitation.  Assessment     ICD-10-CM   1. Paroxysmal atrial fibrillation (HCC)  I48.0 EKG 12-Lead   CHA2DS2-VASc Score is 4 with yearly risk of stroke of 4%. Bleeding risk is 1%/year  2. Essential hypertension  I10 amLODipine (NORVASC) 5 MG tablet   EKG 04/25/2019: Sinus rhythm with first-degree AV block at the  rate of 64 bpm, left atrial abnormality, otherwise normal EKG.  Normal QT interval.  Recommendations:   Patient with paroxysmal atrial fibrillation, maintaining sinus rhythm on high dose of sotalol in spite of her advanced age.  She has normal renal function.  She used to be extremely symptomatic with atrial fibrillation, but now has mild to moderate amount of symptoms with palpitations and mild dizziness and fatigue.  I reviewed her recently performed labs, CBC and renal function are normal.  Blood pressure is elevated today, I will add amlodipine 5 mg daily, as patient has mild orthostasis in the past, will be careful with blood pressure monitoring.  I would like to see her back in 4 weeks for follow-up of hypertension.  Adrian Prows, MD, [facility] 04/28/2019, 5:17 AM [facility]. [place] Pager: [phone] Office: [phone] If no answer Cell [phone]

## 2019-06-01 ENCOUNTER — Encounter: Payer: Self-pay | Admitting: Cardiology

## 2019-06-01 ENCOUNTER — Other Ambulatory Visit: Payer: Self-pay

## 2019-06-01 ENCOUNTER — Ambulatory Visit (INDEPENDENT_AMBULATORY_CARE_PROVIDER_SITE_OTHER): Payer: Medicare Other | Admitting: Cardiology

## 2019-06-01 VITALS — BP 131/74 | HR 65 | Ht 68.0 in | Wt 156.6 lb

## 2019-06-01 DIAGNOSIS — I10 Essential (primary) hypertension: Secondary | ICD-10-CM | POA: Diagnosis not present

## 2019-06-01 DIAGNOSIS — I480 Paroxysmal atrial fibrillation: Secondary | ICD-10-CM

## 2019-06-01 MED ORDER — AMLODIPINE BESYLATE 5 MG PO TABS: 5.0000 mg | ORAL_TABLET | Freq: Every day | ORAL | 1 refills | Status: DC

## 2019-06-01 MED ORDER — WARFARIN SODIUM 2.5 MG PO TABS: 2.5000 mg | ORAL_TABLET | Freq: Once | ORAL | 1 refills | Status: DC

## 2019-06-01 NOTE — Progress Notes (Signed)
Primary Physician/Referring:  Prince Solian, MD  Patient ID: [name], female    DOB: Mar 11, 1928, 85 y.o.   MRN: [id number]  Subjective   No chief complaint on file.   HPI: [name]  is a 85 y.o. female  paroxysmal atrial fibrillation who has had atrial fibrillation ablation x 2. She had maintained sinus rhythm on high dose sotalol in spite of her age needing higher doses. She experiences extreme fatigue and shortness of breath with A. Fib onset. She has occasional breakthrough A. Fib.  Patient is here on 4 week follow up visit for hypertension. She was started on amlodipine 5 mg at her last visit.   She continues to have occasional breakthrough episodes of atrial fibrillation and mostly is able to tolerate these episodes. Latest episode was yesterday.  No bleeding diathesis or dark, tarry stools. Coumadin is being managed by Dr. Dagmar Hait. She also has orthostatic hypotension and has very mild chronic dizziness. She denies any chest pain, shortness of breath. No new symptoms. No recent fall. No malena.  Past Medical History:  Diagnosis Date  . A-fib ([place])    On chronic anticoagulation  . Allergy   . Dysrhythmia    hx of atrial fibrilation  . GERD (gastroesophageal reflux disease)   . Headache(784.0)   . Hyperlipidemia   . Hypertension   . Osteoporosis   . Peripheral vascular disease ([place])   . Pneumonia   . PONV (postoperative nausea and vomiting)     Past Surgical History:  Procedure Laterality Date  . BUNIONECTOMY    . CARDIAC ELECTROPHYSIOLOGY MAPPING AND ABLATION    . FRACTURE SURGERY    . HEMIARTHROPLASTY HIP Right 04/08/2014  . HIP ARTHROPLASTY Right 04/08/2014   Procedure: ARTHROPLASTY BIPOLAR HIP;  Surgeon: Mcarthur Rossetti, MD;  Location: [place];  Service: Orthopedics;  Laterality: Right;    Social History   Socioeconomic History  . Marital status: Widowed    Spouse name: Not on file  . Number of children: 2  . Years of education: Not on file   . Highest education level: Not on file  Occupational History  . Not on file  Social Needs  . Financial resource strain: Not on file  . Food insecurity    Worry: Not on file    Inability: Not on file  . Transportation needs    Medical: Not on file    Non-medical: Not on file  Tobacco Use  . Smoking status: Former Smoker    Packs/day: 0.50    Years: 4.00    Pack years: 2.00    Types: Cigarettes    Quit date: 1952    Years since quitting: 68.6  . Smokeless tobacco: Former Network engineer and Sexual Activity  . Alcohol use: Yes    Comment: occas wine  . Drug use: No  . Sexual activity: Yes  Lifestyle  . Physical activity    Days per week: Not on file    Minutes per session: Not on file  . Stress: Not on file  Relationships  . Social Herbalist on phone: Not on file    Gets together: Not on file    Attends religious service: Not on file    Active member of club or organization: Not on file    Attends meetings of clubs or organizations: Not on file    Relationship status: Not on file  . Intimate partner violence    Fear of current or  ex partner: Not on file    Emotionally abused: Not on file    Physically abused: Not on file    Forced sexual activity: Not on file  Other Topics Concern  . Not on file  Social History Narrative  . Not on file   Review of Systems  Constitution: Negative for chills, decreased appetite, malaise/fatigue and weight gain.  Cardiovascular: Negative for dyspnea on exertion, leg swelling and syncope.  Endocrine: Negative for cold intolerance.  Hematologic/Lymphatic: Does not bruise/bleed easily.  Musculoskeletal: Positive for arthritis. Negative for joint swelling.  Gastrointestinal: Negative for abdominal pain, anorexia, change in bowel habit, hematochezia and melena.  Neurological: Positive for dizziness (occasional). Negative for headaches and light-headedness.  Psychiatric/Behavioral: Negative for depression and substance abuse.   All other systems reviewed and are negative.  Objective  There were no vitals taken for this visit. There is no height or weight on file to calculate BMI.   Physical Exam  Constitutional: She appears well-developed and well-nourished. No distress.  HENT:  Head: Atraumatic.  Eyes: Conjunctivae are normal.  Neck: Neck supple. No JVD present. No thyromegaly present.  Cardiovascular: Normal rate, regular rhythm and normal heart sounds. Exam reveals no gallop.  No murmur heard. She is mild bilateral superficial varicose veins in the lower extremities.  Carotid pulses are normal, except for 1+ pedal pulses, vascular exam is normal.  No leg edema.  Pulmonary/Chest: Effort normal and breath sounds normal.  Abdominal: Soft. Bowel sounds are normal.  Musculoskeletal: Normal range of motion.  Neurological: She is alert.  Skin: Skin is warm and dry.  Psychiatric: She has a normal mood and affect.   Radiology: No results found.  Laboratory examination:   Labs 01/30/19/20: Serum glucose 16 mg, BUN 20, creatinine 0.8, eGFR 67 mL, potassium 4.6, CMP otherwise normal.  HB 14.2/HCT 46.7, platelets 154, normal indicis.  Total cholesterol 157, triglycerides 67, HDL 59, LDL 85.  TSH normal.  Vitamin D 46.  INR 1.9.  A1c 5.7%.  CMP Latest Ref Rng & Units 08/24/2016 04/10/2014 04/09/2014  Glucose 65 - 99 mg/dL 127(H) 144(H) 148(H)  BUN 6 - 20 mg/dL 17 19 20   Creatinine 0.44 - 1.00 mg/dL 0.85 0.80 0.90  Sodium 135 - 145 mmol/L 141 137 140  Potassium 3.5 - 5.1 mmol/L 4.3 4.6 4.4  Chloride 101 - 111 mmol/L 108 101 103  CO2 22 - 32 mmol/L 27 26 27   Calcium 8.9 - 10.3 mg/dL 8.9 8.4 8.1(L)  Total Protein 6.0 - 8.3 g/dL - - -  Total Bilirubin 0.3 - 1.2 mg/dL - - -  Alkaline Phos 39 - 117 U/L - - -  AST 0 - 37 U/L - - -  ALT 0 - 35 U/L - - -   CBC Latest Ref Rng & Units 08/24/2016 04/11/2014 04/10/2014  WBC 4.0 - 10.5 K/uL 6.4 6.8 7.1  Hemoglobin 12.0 - 15.0 g/dL 11.7(L) 10.3(L) 10.7(L)  Hematocrit 36.0 -  46.0 % 37.2 31.7(L) 33.2(L)  Platelets 150 - 400 K/uL 190 122(L) 120(L)   Lipid Panel  No results found for: CHOL, TRIG, HDL, CHOLHDL, VLDL, LDLCALC, LDLDIRECT HEMOGLOBIN A1C No results found for: HGBA1C, MPG TSH No results for input(s): TSH in the last 8760 hours.  Medications   Current Outpatient Medications  Medication Instructions  . acetaminophen (TYLENOL) 500 mg, Oral, Every 6 hours PRN  . amLODipine (NORVASC) 5 mg, Oral, Daily  . EPINEPHrine 0.3 mg/0.3 mL IJ SOAJ injection Intramuscular  . fexofenadine (ALLEGRA) 180 mg,  Oral, Daily PRN  . metoprolol tartrate (LOPRESSOR) 25 MG tablet Oral, 2 times daily  . potassium chloride (K-DUR) 10 MEQ tablet 10 mEq, 2 times daily  . pravastatin (PRAVACHOL) 40 mg, Oral, Daily  . risedronate (ACTONEL) 35 MG tablet Every Sunday morn  . sotalol (BETAPACE) 180 mg, Oral, 2 times daily  . traMADol (ULTRAM) 50 mg, Oral, Every 8 hours PRN, for pain  . venlafaxine (EFFEXOR) 75 mg, Daily  . Vitamin D (Ergocalciferol) (DRISDOL) 50,000 Units, Every 7 days  . warfarin (COUMADIN) 2.5 mg, Oral, Daily   Cardiac Studies:    Echocardiogram 02/13/2016: Left ventricle cavity is normal in size. Mild concentric hypertrophy of the left ventricle. Normal global wall motion. Doppler evidence of grade I (impaired) diastolic dysfunction. Calculated EF 60%. Left atrial cavity is severely dilated at 5.0 cm. Right atrial cavity is mildly dilated. Moderate aortic regurgitation. Mild mitral regurgitation. Moderate tricuspid regurgitation. Mild to moderate pulmonary hypertension. Pulmonary artery systolic pressure is estimated at 39 mm Hg. Mild pulmonic regurgitation.  Assessment   No diagnosis found. EKG 04/25/2019: Sinus rhythm with first-degree AV block at the rate of 64 bpm, left atrial abnormality, otherwise normal EKG.  Normal QT interval.  Recommendations:   Patient with paroxysmal atrial fibrillation, maintaining sinus rhythm on high dose of sotalol  in spite of her advanced age.  She has normal renal function.  She used to be extremely symptomatic with atrial fibrillation, but now has mild to moderate amount of symptoms with palpitations and mild dizziness and fatigue. She did have an episode of atrial fibrillation yesterday that last a few hours as per patient's history.  I started her on amlodipine on her last office visit due to uncontrolled hypertension, but this is now very well controlled and she remains asymptomatic and also states that her headache has improved.  We'll continue the same.  Warfarin prescription refills although INR is being managed by PCP, patient is on stable dose of Coumadin.  I'll see her back in 6 months for follow-up.  Adrian Prows, MD, [facility] 06/01/2019, 11:55 AM [facility]. [place] Pager: [phone] Office: [phone] If no answer Cell [phone]

## 2019-06-11 ENCOUNTER — Other Ambulatory Visit: Payer: Self-pay | Admitting: Cardiology

## 2019-06-11 DIAGNOSIS — I480 Paroxysmal atrial fibrillation: Secondary | ICD-10-CM

## 2019-06-12 NOTE — Telephone Encounter (Signed)
Ok to fill 

## 2019-06-12 NOTE — Telephone Encounter (Signed)
Yes, please give with 6 months supply

## 2019-06-15 DIAGNOSIS — R531 Weakness: Secondary | ICD-10-CM | POA: Insufficient documentation

## 2019-06-15 DIAGNOSIS — R42 Dizziness and giddiness: Secondary | ICD-10-CM | POA: Insufficient documentation

## 2019-10-18 ENCOUNTER — Other Ambulatory Visit: Payer: Self-pay | Admitting: Cardiology

## 2019-10-18 DIAGNOSIS — I480 Paroxysmal atrial fibrillation: Secondary | ICD-10-CM

## 2019-10-20 IMAGING — CT CT HEAD WITHOUT CONTRAST
4 series · 17 of 47 positions shown, 19 images · non-contrast
Comparison: 08/24/2016 CT and MRI

CLINICAL DATA: Evaluate meningioma.  Normal pressure hydrocephalus.

EXAM:
CT HEAD WITHOUT CONTRAST
TECHNIQUE: Contiguous axial images were obtained from the base of the skull
through the vertex without intravenous contrast.

[Series 2: head 5.00 hr40 s3 ibhc · axial · 0.39mm/px · z∈[-532,-412]mm · 7 of 34 slices shown, 9 images]
[im 5/34  brain]
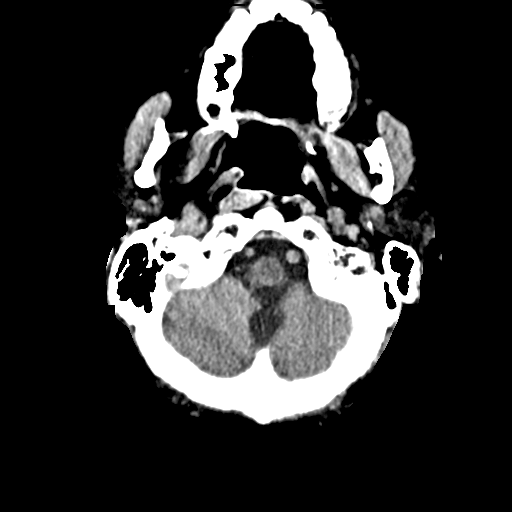
[im 5/34  bone]
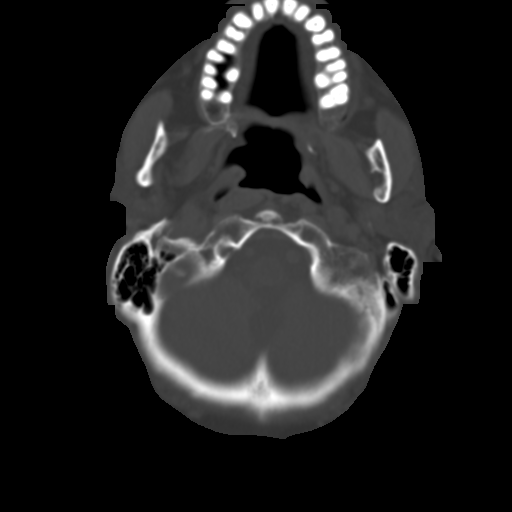
[im 9/34  brain]
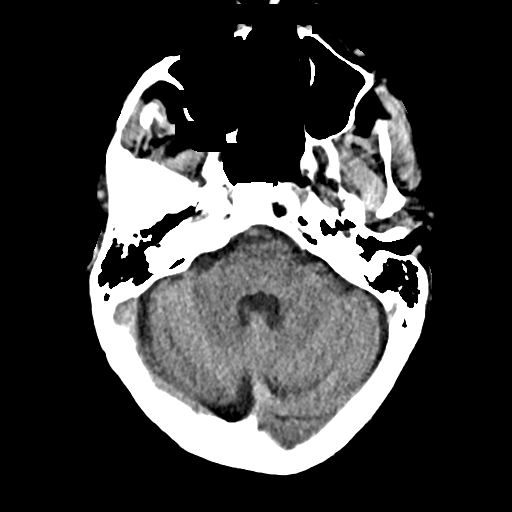
[im 13/34  brain]
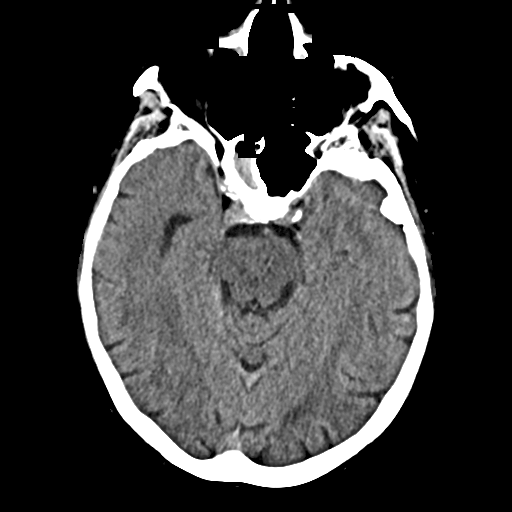
[im 17/34  brain]
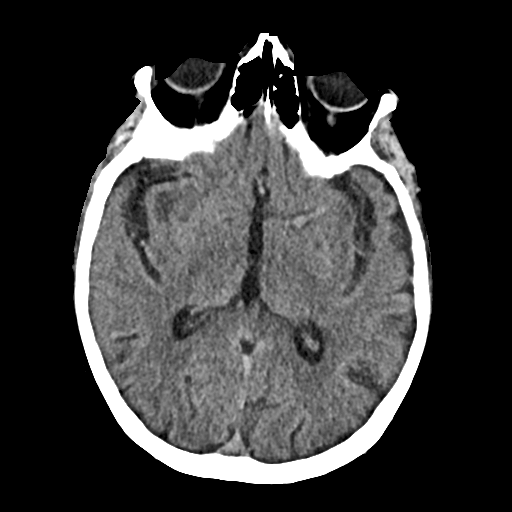
[im 21/34  brain]
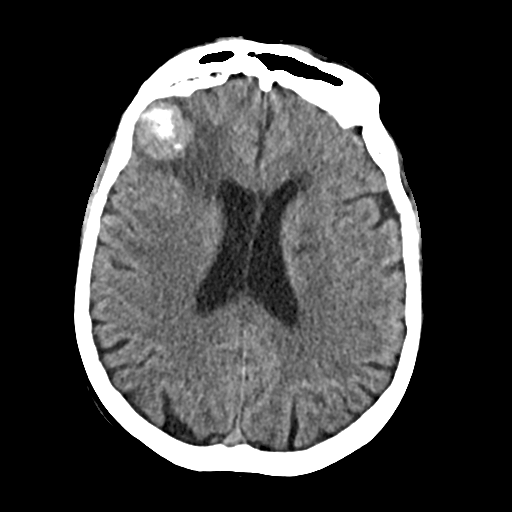
[im 21/34  bone]
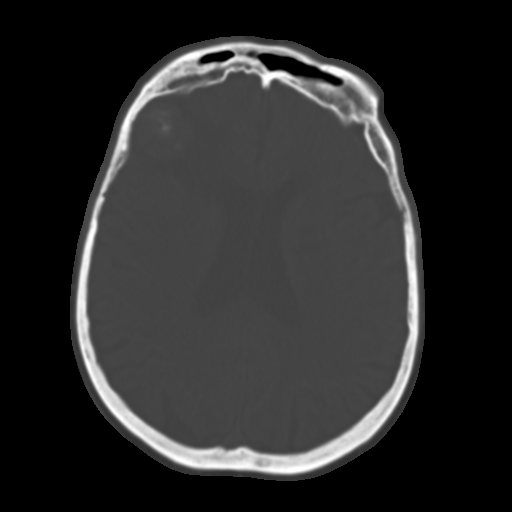
[im 25/34  brain]
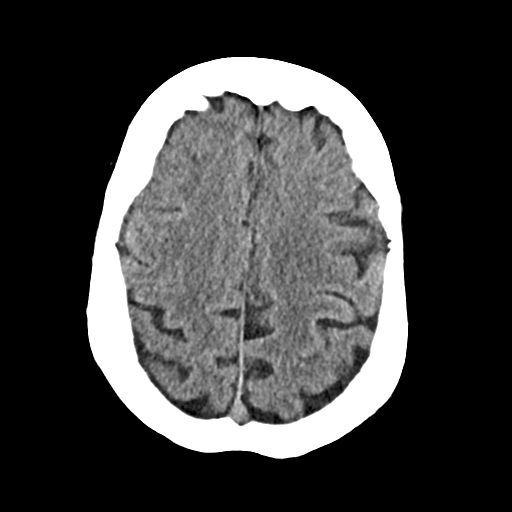
[im 29/34  brain]
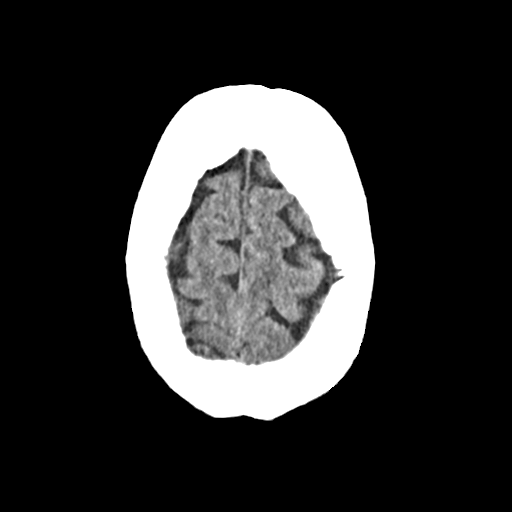

[Series 3: head 2.00 hr60 s3 bone · axial · 0.39mm/px · z∈[-537,-479]mm · 4 of 85 slices shown]
[im 9/85  bone]
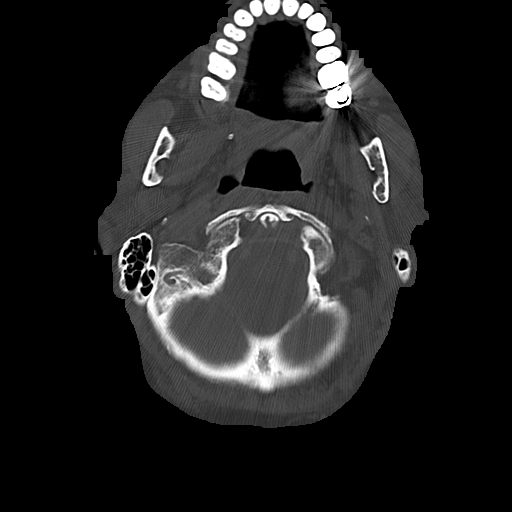
[im 17/85  bone]
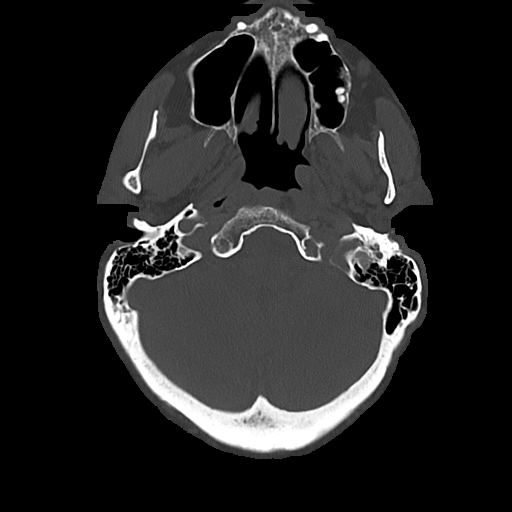
[im 26/85  bone]
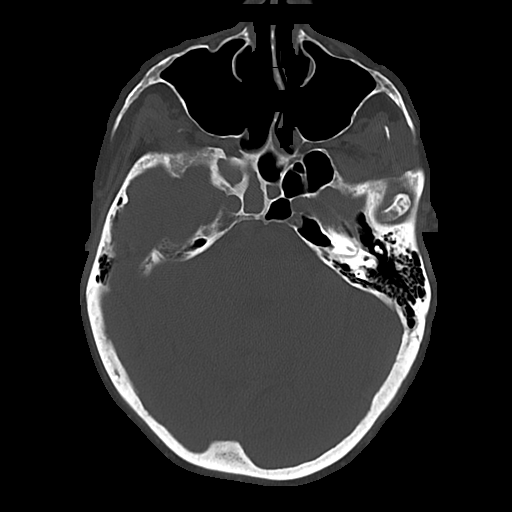
[im 38/85  bone]
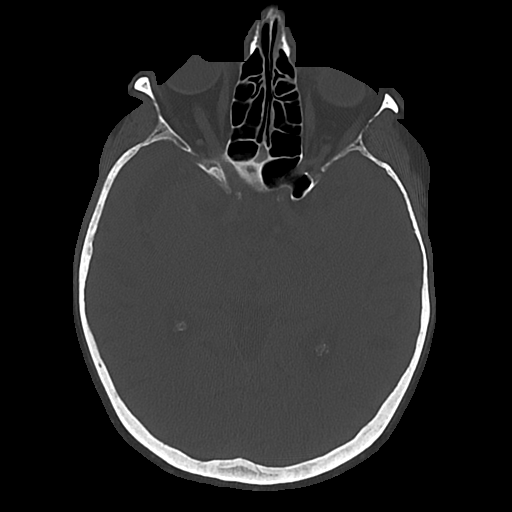

[Series 4: head 3.00 hr40 s3 sag · sagittal · 0.33mm/px · 3 of 89 slices shown]
[im 30/89  brain]
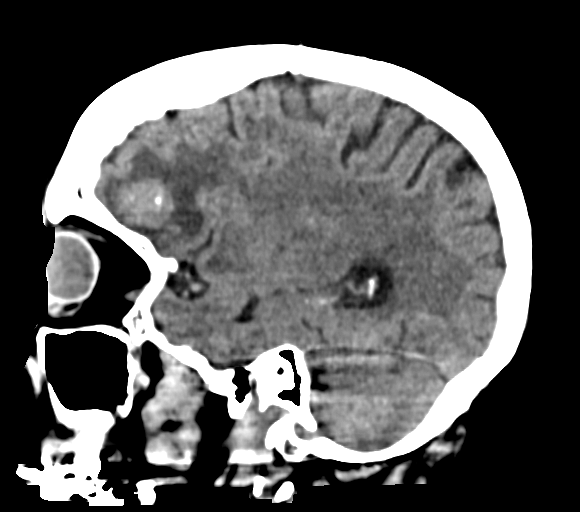
[im 45/89  brain]
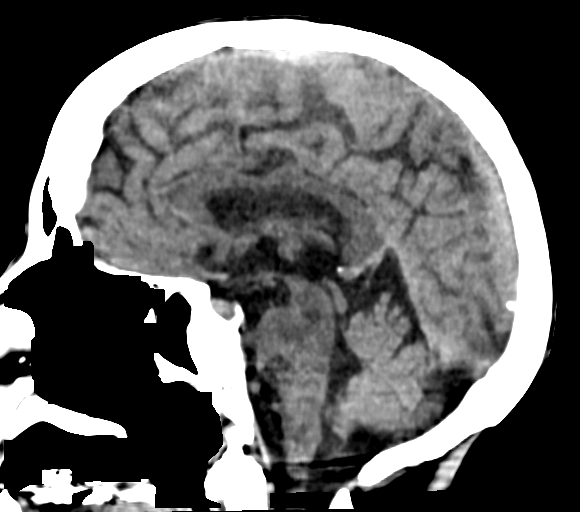
[im 59/89  brain]
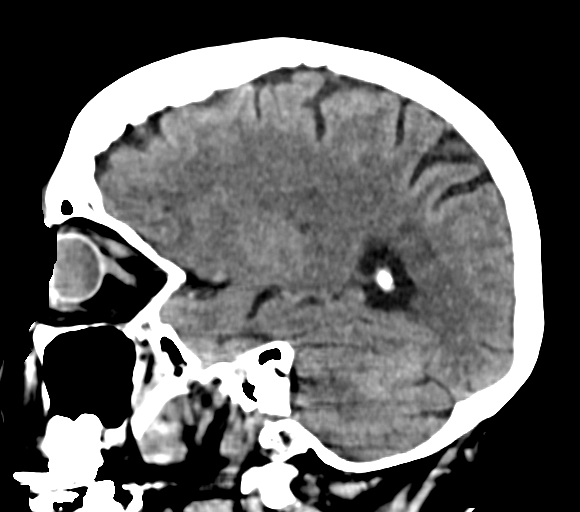

[Series 6: head 3.00 hr40 s3 cor · coronal · 0.34mm/px · 3 of 96 slices shown]
[im 32/96  brain]
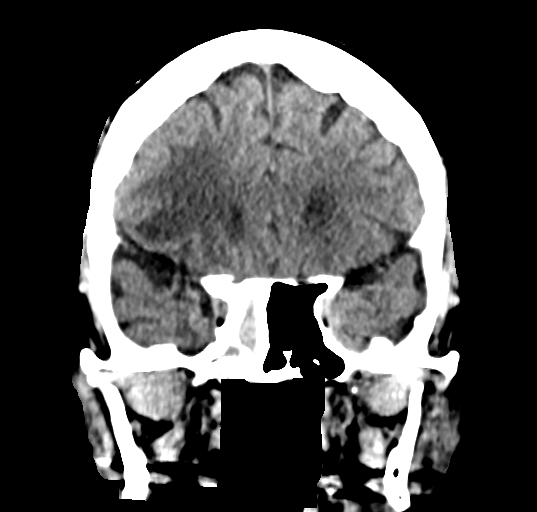
[im 43/96  brain]
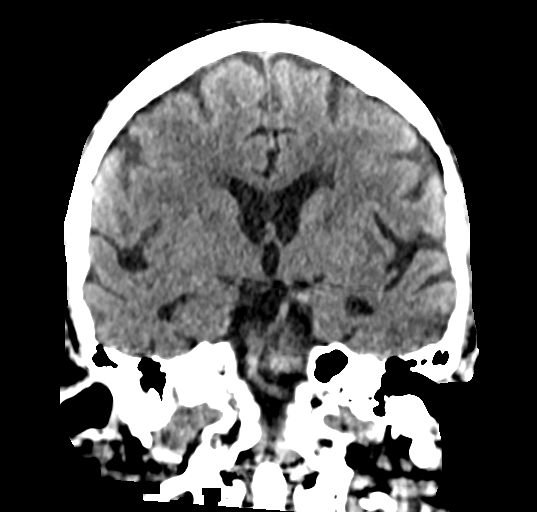
[im 53/96  brain]
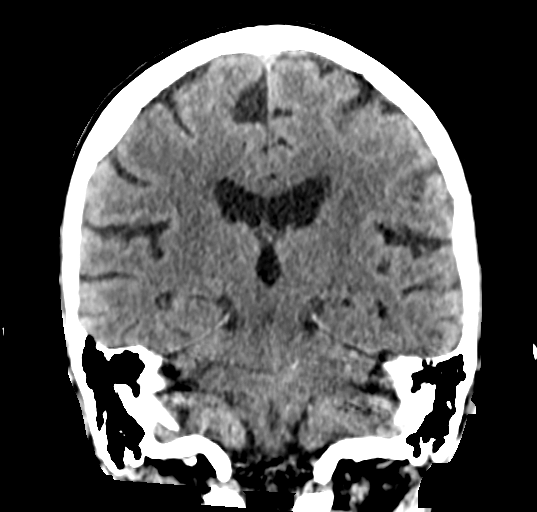

[17 of 47 positions shown; findings below may reference images not displayed]

FINDINGS: Brain: Hyperdense lesion noted in the right frontal region measuring
2.2 x 2.1 cm with central calcifications. This is most compatible
with meningioma is stable. Overlying low-density within the right
from deep white matter. Chronic microvascular disease throughout the
deep white matter diffusely. No hydrocephalus, hemorrhage or acute
infarction. No midline shift.

Vascular: No hyperdense vessel or unexpected calcification.

Skull: No acute calvarial abnormality.

Sinuses/Orbits: Opacification of the right sphenoid sinus. No acute
findings.

Other: None
IMPRESSION: 2.2 cm right frontal extra-axial lesion most compatible with
meningioma, stable since prior study. Moderate overlying vasogenic
edema is stable.

Chronic microvascular ischemic changes throughout the deep white
matter.

## 2019-12-04 ENCOUNTER — Ambulatory Visit: Payer: Medicare Other | Admitting: Cardiology

## 2019-12-04 ENCOUNTER — Other Ambulatory Visit: Payer: Self-pay

## 2019-12-04 ENCOUNTER — Encounter: Payer: Self-pay | Admitting: Cardiology

## 2019-12-04 VITALS — BP 138/83 | HR 65 | Temp 96.2°F | Ht 68.0 in | Wt 156.7 lb

## 2019-12-04 DIAGNOSIS — I480 Paroxysmal atrial fibrillation: Secondary | ICD-10-CM

## 2019-12-04 DIAGNOSIS — I10 Essential (primary) hypertension: Secondary | ICD-10-CM

## 2019-12-04 MED ORDER — AMLODIPINE BESYLATE 2.5 MG PO TABS: 2.5000 mg | ORAL_TABLET | Freq: Every day | ORAL | 3 refills | Status: DC

## 2019-12-04 NOTE — Progress Notes (Signed)
Primary Physician/Referring:  Prince Solian, MD  Patient ID: [name], female    DOB: 12-17-1927, 86 y.o.   MRN: [id number]  Subjective   Chief Complaint  Patient presents with  . Atrial Fibrillation  . Hypertension  . Follow-up    6 month    HPI: [name]  is a 86 y.o. female  paroxysmal atrial fibrillation who has had atrial fibrillation ablation x 2, essential hypertension, mild orthostatic hypotension and hyperlipidemia. She had maintained sinus rhythm on high dose sotalol in spite of her age needing higher doses. She experiences extreme fatigue and shortness of breath with A. Fib onset. She has occasional breakthrough A. Fib. She was started on Amlodipine for hypertension on her last OV 6 months ago.    She continues to have occasional breakthrough episodes of atrial fibrillation and mostly is able to tolerate these episodes. No bleeding diathesis or dark, tarry stools. Coumadin is being managed by Dr. Dagmar Hait. he denies any chest pain, shortness of breath. No new symptoms. No recent fall. No dark stools.  Due to dizziness, amlodipine dose was reduced by her daughter from 5 mg to 25 mg.  Past Medical History:  Diagnosis Date  . A-fib ([place])    On chronic anticoagulation  . Allergy   . Dysrhythmia    hx of atrial fibrilation  . GERD (gastroesophageal reflux disease)   . Headache(784.0)   . Hyperlipidemia   . Hypertension   . Osteoporosis   . Peripheral vascular disease ([place])   . Pneumonia   . PONV (postoperative nausea and vomiting)     Past Surgical History:  Procedure Laterality Date  . BUNIONECTOMY    . CARDIAC ELECTROPHYSIOLOGY MAPPING AND ABLATION    . FRACTURE SURGERY    . HEMIARTHROPLASTY HIP Right 04/08/2014  . HIP ARTHROPLASTY Right 04/08/2014   Procedure: ARTHROPLASTY BIPOLAR HIP;  Surgeon: Mcarthur Rossetti, MD;  Location: [facility];  Service: Orthopedics;  Laterality: Right;   Social History   Tobacco Use  . Smoking status: Former Smoker     Packs/day: 0.50    Years: 4.00    Pack years: 2.00    Types: Cigarettes    Quit date: 1952    Years since quitting: 69.1  . Smokeless tobacco: Former Network engineer Use Topics  . Alcohol use: Yes    Comment: occas wine    Family History  Problem Relation Age of Onset  . Aneurysm Mother   . Tuberculosis Father     Review of Systems  Cardiovascular: Negative for dyspnea on exertion and leg swelling.  Musculoskeletal: Positive for arthritis.  Gastrointestinal: Negative for melena.  Neurological: Positive for dizziness (occasional).   Objective  Blood pressure 138/83, pulse 65, temperature (!) 96.2 F (35.7 C), height 5' 8"  (1.727 m), weight 156 lb 11.2 oz (71.1 kg), SpO2 98 %. Body mass index is 23.83 kg/m.   Physical Exam  Constitutional: She appears well-developed and well-nourished. No distress.  Cardiovascular: Normal rate, regular rhythm and normal heart sounds. Exam reveals no gallop.  No murmur heard. She is mild bilateral superficial varicose veins in the lower extremities.  Carotid pulses are normal, except for 1+ pedal pulses, vascular exam is normal.  No leg edema.  Pulmonary/Chest: Effort normal and breath sounds normal.  Abdominal: Soft. Bowel sounds are normal.  Musculoskeletal:        General: Normal range of motion.   Radiology: No results found.  Laboratory examination:   Cholesterol, total 157.000 m  02/07/2019 HDL 59 MG/DL 02/07/2019 LDL 85.000 mg 02/07/2019 Triglycerides 67.000 02/07/2019  A1C 5.700 % 02/07/2019; TSH 1.990 02/07/2019  Hemoglobin 15.400 g/ 11/15/2019; INR 2.700 11/15/2019 Creatinine, Serum 0.800 mg/ 11/15/2019 ALT (SGPT) 21.000 uni 02/07/2019  Labs 01/30/19/20: Serum glucose 16 mg, BUN 20, creatinine 0.8, eGFR 67 mL, potassium 4.6, CMP otherwise normal.  HB 14.2/HCT 46.7, platelets 154, normal indicis.  Total cholesterol 157, triglycerides 67, HDL 59, LDL 85.  TSH normal.  Vitamin D 46.  INR 1.9.  A1c 5.7%.  Medications   Current Outpatient  Medications  Medication Instructions  . acetaminophen (TYLENOL) 500 mg, Oral, Every 6 hours PRN  . amLODipine (NORVASC) 2.5 mg, Oral, Daily  . EPINEPHrine 0.3 mg/0.3 mL IJ SOAJ injection Intramuscular  . fexofenadine (ALLEGRA) 180 mg, Oral, Daily PRN  . potassium chloride (K-DUR) 10 MEQ tablet 10 mEq, 2 times daily  . pravastatin (PRAVACHOL) 40 mg, Oral, Daily  . sotalol (BETAPACE) 120 MG tablet TAKE 1&1/2 TABLETS BY MOUTH 2 TIMES DAILY.  . traMADol (ULTRAM) 50 mg, Oral, Every 8 hours PRN, for pain  . venlafaxine (EFFEXOR) 75 mg, Daily  . Vitamin D (Ergocalciferol) (DRISDOL) 50,000 Units, Every 7 days  . warfarin (COUMADIN) 2.5 MG tablet TAKE 1 TABLET BY MOUTH DAILY AT 6 PM FOR 180 DOSES. AS DIRECTED PER PROTIME   Cardiac Studies:   Echocardiogram 02/13/2016: Left ventricle cavity is normal in size. Mild concentric hypertrophy of the left ventricle. Normal global wall motion. Doppler evidence of grade I (impaired) diastolic dysfunction. Calculated EF 60%. Left atrial cavity is severely dilated at 5.0 cm. Right atrial cavity is mildly dilated. Moderate aortic regurgitation. Mild mitral regurgitation. Moderate tricuspid regurgitation. Mild to moderate pulmonary hypertension. Pulmonary artery systolic pressure is estimated at 39 mm Hg. Mild pulmonic regurgitation.  Assessment     ICD-10-CM   1. Paroxysmal atrial fibrillation (HCC)  I48.0 EKG 12-Lead  2. Essential hypertension  I10 amLODipine (NORVASC) 2.5 MG tablet   EKG 12/04/1019: Sinus rhythm with first-degree AV block at the rate of 62 bpm, normal axis, baseline artifact, nonspecific T abnormality.   No significant change from  EKG 04/25/2019.  Recommendations:    [name]  is a 86 y.o. female  paroxysmal atrial fibrillation who has had atrial fibrillation ablation x 2, essential hypertension, mild orthostatic hypotension and hyperlipidemia. She had maintained sinus rhythm on high dose sotalol in spite of her age needing  higher doses. She experiences extreme fatigue and shortness of breath with A. Fib onset. She has occasional breakthrough A. Fib. She was started on Amlodipine for hypertension on her last OV 6 months ago.  She has reduce amlodipine to 2.5 mg, in spite of this blood pressure is well controlled.  Hence I refill 2.5 mg prescription.  Patient with paroxysmal atrial fibrillation, maintaining sinus rhythm on high dose of sotalol in spite of her advanced age, she will turn 9 Y of age next month. Happy Birth[birth year] 89.   She has normal renal function.  She used to be extremely symptomatic with atrial fibrillation, but now has mild to moderate amount of symptoms with palpitations and mild dizziness and fatigue.  INR is being managed by PCP, patient is on stable dose of Coumadin.  I'll see her back in 6 months for follow-up.  Adrian Prows, MD, [facility] 12/04/2019, 11:38 AM [facility]. [place] Office: [phone]

## 2019-12-16 ENCOUNTER — Other Ambulatory Visit: Payer: Self-pay | Admitting: Cardiology

## 2019-12-16 DIAGNOSIS — I480 Paroxysmal atrial fibrillation: Secondary | ICD-10-CM

## 2019-12-18 NOTE — Telephone Encounter (Signed)
Advise on rf

## 2019-12-18 NOTE — Telephone Encounter (Signed)
Can refill. 

## 2020-01-16 ENCOUNTER — Encounter: Payer: Self-pay | Admitting: Nurse Practitioner

## 2020-01-16 ENCOUNTER — Ambulatory Visit (INDEPENDENT_AMBULATORY_CARE_PROVIDER_SITE_OTHER): Payer: Medicare Other | Admitting: Nurse Practitioner

## 2020-01-16 VITALS — BP 127/74 | HR 78 | Temp 97.6°F | Ht 68.0 in | Wt 154.0 lb

## 2020-01-16 DIAGNOSIS — R1310 Dysphagia, unspecified: Secondary | ICD-10-CM | POA: Diagnosis not present

## 2020-01-16 DIAGNOSIS — R197 Diarrhea, unspecified: Secondary | ICD-10-CM | POA: Diagnosis not present

## 2020-01-16 MED ORDER — DIPHENOXYLATE-ATROPINE 2.5-0.025 MG PO TABS: ORAL_TABLET | ORAL | 0 refills | Status: DC

## 2020-01-16 NOTE — Progress Notes (Signed)
Reviewed and agree with management plans. ? ?Ethanael Veith L. Shonn Farruggia, MD, MPH  ?

## 2020-01-16 NOTE — Patient Instructions (Signed)
If you are age 84 or older, your body mass index should be between 23-30. Your Body mass index is 23.42 kg/m. If this is out of the aforementioned range listed, please consider follow up with your Primary Care Provider.  If you are age 7 or younger, your body mass index should be between 19-25. Your Body mass index is 23.42 kg/m. If this is out of the aformentioned range listed, please consider follow up with your Primary Care Provider.   We have sent the following medications to your pharmacy for you to pick up at your convenience: Lomotil  Follow up as needed.  Thank you for choosing me and [facility].   Tye Savoy, NP

## 2020-01-16 NOTE — Progress Notes (Signed)
ASSESSMENT / PLAN:   86 year old female with PMH significant for, not necessarily limited to A. fib on chronic anticoagulation, hypertension, hyperlipidemia, osteoporosis, peripheral vascular disease, C-diff.   #Sporadic diarrhea with associated gas / bloating.  --Present for maybe 3 months, patient not sure --The majority of the time her BMs are normal. On average, diarrhea occurs only 1-2 times a day, one day a week or less.   No associated abdominal pain / blood in stool / abnormal weight loss.  --Low suspicion for infectious diarrhea at this point.  --For unclear reasons, 1 dose of Xifaxan has immediately stopped the diarrhea ( on two occasions). SIBO a consideration but would be atypical presentation.  --Trial of Lomotil --If fails Lomotil / diarrhea gets worse will check stool studies ( sounds like she didn't do the c-diff study ordered by PCP).  If negative then consider breath tests for SIBO.    #Chronic solid food dysphagia, sounds stable --We discussed barium swallow to evaluate for dysmotility and/or esophageal strictures. --Regardless of barium swallow findings patient will not agree to an EGD.  Therefore, we will hold off on barium swallow since it will not change management.  Patient and her daughter agree --Happy to order esophagram at later date if patient changes her mind.  Otherwise, advised patient to eat small bites, chew well with liquids in between bites to avoid food impaction.  #A. fib/chronic anticoagulation on warfarin   HPI:    Chief Complaint:  diarrhea   [name] is a 86 year old female, new to the practice, referred by PCP for evaluation of diarrhea.  Patient is accompanied by her daughter who helps provide history.  Patient unsure how long she has been having intermittent diarrhea, best guess is approximately 3 months.  For the most part her bowel movements are normal.  Her best guess is that she has diarrhea maybe 1 time a week but not  every week.  She will have 1-2 loose, non-bloody BMs postprandially on the days when she does have diarrhea.  She has no associated abdominal pain but does endorse bloating and gas.  It sounds like Imodium has not been very helpful, nor have probiotics.  She possibly has tried Lomotil in the distant past but this is unclear.  Also sounds like she has been tried on Flagyl ( ? When) which didn't help. At some point her PCP provided samples of Xifaxan.  She has taken one dose a couple of times and each time the diarrhea subsided.  She tried to fill a prescription for Xifaxan but it was expensive.  Patient has a history of C. difficile infection 2 years ago.  She failed vancomycin, daughter apparently took her to infectious disease specialist in [place] and she was treated with unknown medication with resolution of the infection.  She has not had any recent antibiotics.   Per PCP note 12/27/2019, c-diff specimen cup given to patient but patient tells me this is not c-diff. She was unable to leave her home back then due to diarrhera. She cannot correlate episodes of diarrhea with any of her medications or certain foods.   Daughter mentions that patient has chronic solid food dysphagia.  It hasn't led to any weight loss.    Data Reviewed:  Labs from [facility] 11/15/2019 CBC normal including WBC 5.2, hemoglobin 15.4, MCV 94, platelets 171 Normal BUN, normal creatinine of 0.8   Past Medical History:  Diagnosis  Date  . A-fib [facility])    On chronic anticoagulation  . Allergy   . Dysrhythmia    hx of atrial fibrilation  . GERD (gastroesophageal reflux disease)   . Headache(784.0)   . Hyperlipidemia   . Hypertension   . Osteoporosis   . Peripheral vascular disease ([place])   . Pneumonia   . PONV (postoperative nausea and vomiting)      Past Surgical History:  Procedure Laterality Date  . BUNIONECTOMY    . CARDIAC ELECTROPHYSIOLOGY MAPPING AND ABLATION    . FRACTURE SURGERY    .  HEMIARTHROPLASTY HIP Right 04/08/2014  . HIP ARTHROPLASTY Right 04/08/2014   Procedure: ARTHROPLASTY BIPOLAR HIP;  Surgeon: Mcarthur Rossetti, MD;  Location: [facility];  Service: Orthopedics;  Laterality: Right;   Family History  Problem Relation Age of Onset  . Aneurysm Mother   . Tuberculosis Father    Social History   Tobacco Use  . Smoking status: Former Smoker    Packs/day: 0.50    Years: 4.00    Pack years: 2.00    Types: Cigarettes    Quit date: 1952    Years since quitting: 69.3  . Smokeless tobacco: Former Network engineer Use Topics  . Alcohol use: Yes    Comment: occas wine  . Drug use: No   Current Outpatient Medications  Medication Sig Dispense Refill  . acetaminophen (TYLENOL) 500 MG tablet Take 500 mg by mouth every 6 (six) hours as needed for mild pain.    Marland Kitchen amLODipine (NORVASC) 2.5 MG tablet Take 1 tablet (2.5 mg total) by mouth daily. 90 tablet 3  . EPINEPHrine 0.3 mg/0.3 mL IJ SOAJ injection Inject into the muscle.    . fexofenadine (ALLEGRA) 180 MG tablet Take 180 mg by mouth daily as needed.     . potassium chloride (K-DUR) 10 MEQ tablet Take 10 mEq by mouth 2 (two) times daily.     . pravastatin (PRAVACHOL) 40 MG tablet Take 40 mg by mouth daily.    . rifaximin (XIFAXAN) 550 MG TABS tablet Take 550 mg by mouth. Was given samples.    . risedronate (ACTONEL) 35 MG tablet Take 35 mg by mouth every 7 (seven) days. with water on empty stomach, nothing by mouth or lie down for next 30 minutes.    . sotalol (BETAPACE) 120 MG tablet TAKE 1&1/2 TABLETS BY MOUTH 2 TIMES DAILY. 270 tablet 2  . traMADol (ULTRAM) 50 MG tablet Take 50 mg by mouth every 8 (eight) hours as needed. for pain    . venlafaxine (EFFEXOR) 75 MG tablet Take 75 mg by mouth daily.    . Vitamin D, Ergocalciferol, (DRISDOL) 50000 UNITS CAPS capsule Take 50,000 Units by mouth every 7 (seven) days. Wednesday    . warfarin (COUMADIN) 2.5 MG tablet TAKE 1 TABLET BY MOUTH DAILY AT 6 PM FOR 180 DOSES. AS  DIRECTED PER PROTIME 90 tablet 1   No current facility-administered medications for this visit.   Allergies  Allergen Reactions  . Contrast Media [Iodinated Diagnostic Agents] Anaphylaxis  . Dronedarone Other (See Comments)    CHF  . Other Other (See Comments)    She is allergic to MRI contrast media per daughter Pt states she is allergic to heart medications, can not recall names/ steroid injections ivp dye. ivp dye, reported MRI contrast  . Amiodarone     Pulmonary edema  . Flecainide     Parkinson like raction  . Latex Itching and Swelling  .  Multaq [Dronedarone Hydrochloride] Swelling  . Sulfa Antibiotics Other (See Comments)    Makes her sicker & it causes her to have dark rings around her eyes     Review of Systems: Positive for allergy/sinus trouble, arthritis and back pain  All other systems reviewed and negative except where noted in HPI.   Creatinine clearance cannot be calculated (Patient's most recent lab result is older than the maximum 21 days allowed.)   Physical Exam:    Wt Readings from Last 3 Encounters:  01/16/20 154 lb (69.9 kg)  12/04/19 156 lb 11.2 oz (71.1 kg)  06/01/19 156 lb 9.6 oz (71 kg)    BP 127/74   Pulse 78   Temp 97.6 F (36.4 C)   Ht 5\' 8"  (1.727 m)   Wt 154 lb (69.9 kg)   SpO2 100%   BMI 23.42 kg/m  Constitutional:  Pleasant female in no acute distress. Psychiatric: Normal mood and affect. Behavior is normal. EENT: Pupils normal.  Conjunctivae are normal. No scleral icterus. Neck supple.  Cardiovascular: Normal rate, regular rhythm. No edema Pulmonary/chest: Effort normal and breath sounds normal. No wheezing, rales or rhonchi. Abdominal: Examined in chair. Abdomen soft, nondistended, nontender. Bowel sounds active throughout.  Neurological: Alert and oriented to person place and time. Skin: Skin is warm and dry. No rashes noted.  Tye Savoy, NP  01/16/2020, 11:40 AM   I spent 45 minutes total reviewing records, obtaining  history, performing exam, counseling patient and documenting visit / findings.    Cc:  Referring Provider Prince Solian, MD

## 2020-04-16 DIAGNOSIS — S8002XA Contusion of left knee, initial encounter: Secondary | ICD-10-CM | POA: Insufficient documentation

## 2020-06-03 ENCOUNTER — Ambulatory Visit: Payer: Medicare Other | Admitting: Cardiology

## 2020-06-03 ENCOUNTER — Encounter: Payer: Self-pay | Admitting: Cardiology

## 2020-06-03 VITALS — BP 116/74 | HR 67 | Resp 15 | Ht 68.0 in | Wt 156.0 lb

## 2020-06-03 DIAGNOSIS — I480 Paroxysmal atrial fibrillation: Secondary | ICD-10-CM

## 2020-06-03 DIAGNOSIS — I10 Essential (primary) hypertension: Secondary | ICD-10-CM

## 2020-06-03 NOTE — Progress Notes (Signed)
Primary Physician/Referring:  Prince Solian, MD  Patient ID: [name], female    DOB: 1928/06/16, 86 y.o.   MRN: [id number]  Subjective   Chief Complaint  Patient presents with  . Follow-up    6 month  . Atrial Fibrillation  . Hypertension    HPI: [name]  is a 86 y.o. female  paroxysmal atrial fibrillation who has had atrial fibrillation ablation x 2, essential hypertension, mild orthostatic hypotension and hyperlipidemia. She had maintained sinus rhythm on high dose sotalol in spite of her age needing higher doses. No bleeding diathesis or dark, tarry stools. Coumadin is being managed by Dr. Dagmar Hait. She denies any chest pain, shortness of breath. No new symptoms. No recent fall.   Past Medical History:  Diagnosis Date  . A-fib ([place])    On chronic anticoagulation  . Allergy   . Dysrhythmia    hx of atrial fibrilation  . GERD (gastroesophageal reflux disease)   . Headache(784.0)   . Hyperlipidemia   . Hypertension   . Osteoporosis   . Peripheral vascular disease ([place])   . Pneumonia   . PONV (postoperative nausea and vomiting)     Past Surgical History:  Procedure Laterality Date  . BUNIONECTOMY    . CARDIAC ELECTROPHYSIOLOGY MAPPING AND ABLATION    . FRACTURE SURGERY    . HEMIARTHROPLASTY HIP Right 04/08/2014  . HIP ARTHROPLASTY Right 04/08/2014   Procedure: ARTHROPLASTY BIPOLAR HIP;  Surgeon: Mcarthur Rossetti, MD;  Location: [facility];  Service: Orthopedics;  Laterality: Right;   Social History   Tobacco Use  . Smoking status: Former Smoker    Packs/day: 0.50    Years: 4.00    Pack years: 2.00    Types: Cigarettes    Quit date: 1952    Years since quitting: 69.6  . Smokeless tobacco: Former Network engineer Use Topics  . Alcohol use: Yes    Comment: occas wine    Family History  Problem Relation Age of Onset  . Aneurysm Mother   . Tuberculosis Father     Review of Systems  Cardiovascular: Negative for dyspnea on exertion and leg swelling.   Musculoskeletal: Positive for arthritis.  Gastrointestinal: Negative for melena.  Neurological: Positive for dizziness (occasional).   Objective  Blood pressure 116/74, pulse 67, resp. rate 15, height 5' 8"  (1.727 m), weight 156 lb (70.8 kg), SpO2 96 %. Body mass index is 23.72 kg/m.   Physical Exam Constitutional:      General: She is not in acute distress.    Appearance: She is well-developed.  Cardiovascular:     Rate and Rhythm: Normal rate and regular rhythm.     Heart sounds: Normal heart sounds. No murmur heard.  No gallop.      Comments: She is mild bilateral superficial varicose veins in the lower extremities.  Carotid pulses are normal, except for 1+ pedal pulses, vascular exam is normal.  No leg edema. Pulmonary:     Effort: Pulmonary effort is normal.     Breath sounds: Normal breath sounds.  Abdominal:     General: Bowel sounds are normal.     Palpations: Abdomen is soft.  Musculoskeletal:        General: Normal range of motion.    Radiology: No results found.  Laboratory examination:   Cholesterol, total 170.000 m 02/22/2020 HDL 57 MG/DL 02/22/2020 LDL 96.000 mg 02/22/2020 Triglycerides 86.000 02/22/2020  A1C 5.700 % 02/22/2020 TSH 3.940 02/22/2020  Hemoglobin 15.300 g/d 02/22/2020  INR 2.000 05/08/2020  Creatinine, Serum 1.100 mg/ 04/16/2020 ALT (SGPT) 19.000 uni 02/22/2020  Cholesterol, total 157.000 m 02/07/2019 HDL 59 MG/DL 02/07/2019 LDL 85.000 mg 02/07/2019 Triglycerides 67.000 02/07/2019  A1C 5.700 % 02/07/2019; TSH 1.990 02/07/2019  Hemoglobin 15.400 g/ 11/15/2019; INR 2.700 11/15/2019 Creatinine, Serum 0.800 mg/ 11/15/2019 ALT (SGPT) 21.000 uni 02/07/2019  Labs 01/30/19/20: Serum glucose 16 mg, BUN 20, creatinine 0.8, eGFR 67 mL, potassium 4.6, CMP otherwise normal.  HB 14.2/HCT 46.7, platelets 154, normal indicis.   Total cholesterol 157, triglycerides 67, HDL 59, LDL 85.  TSH normal.  Vitamin D 46.  INR 1.9.  A1c 5.7%.  Medications   Current Outpatient  Medications  Medication Instructions  . acetaminophen (TYLENOL) 500 mg, Oral, Every 6 hours PRN  . amLODipine (NORVASC) 2.5 mg, Oral, Daily  . diphenoxylate-atropine (LOMOTIL) 2.5-0.025 MG tablet Take one tablet 1-2 times daily.  Marland Kitchen EPINEPHrine 0.3 mg/0.3 mL IJ SOAJ injection Intramuscular  . fexofenadine (ALLEGRA) 180 mg, Oral, Daily PRN  . potassium chloride (K-DUR) 10 MEQ tablet 10 mEq, 2 times daily  . pravastatin (PRAVACHOL) 40 mg, Oral, Daily  . rifaximin (XIFAXAN) 550 mg  . risedronate (ACTONEL) 35 mg, Oral, Every 7 days, with water on empty stomach, nothing by mouth or lie down for next 30 minutes.   . sotalol (BETAPACE) 120 MG tablet TAKE 1&1/2 TABLETS BY MOUTH 2 TIMES DAILY.  . traMADol (ULTRAM) 50 mg, Oral, Every 8 hours PRN, for pain  . venlafaxine (EFFEXOR) 75 mg, Daily  . Vitamin D (Ergocalciferol) (DRISDOL) 50,000 Units, Every 7 days  . warfarin (COUMADIN) 2.5 MG tablet TAKE 1 TABLET BY MOUTH DAILY AT 6 PM FOR 180 DOSES. AS DIRECTED PER PROTIME   Cardiac Studies:   Echocardiogram 02/13/2016: Left ventricle cavity is normal in size. Mild concentric hypertrophy of the left ventricle. Normal global wall motion. Doppler evidence of grade I (impaired) diastolic dysfunction. Calculated EF 60%. Left atrial cavity is severely dilated at 5.0 cm. Right atrial cavity is mildly dilated. Moderate aortic regurgitation. Mild mitral regurgitation. Moderate tricuspid regurgitation. Mild to moderate pulmonary hypertension. Pulmonary artery systolic pressure is estimated at 39 mm Hg. Mild pulmonic regurgitation.  EKG:  EKG 06/03/2020: Sinus rhythm with first-degree block at rate of 62 bpm, normal axis, no evidence of ischemia.  Normal QT interval.   EKG 12/04/1019: Sinus rhythm with first-degree AV block at the rate of 62 bpm, normal axis, baseline artifact, nonspecific T abnormality.   No significant change from  EKG 04/25/2019.  Assessment     ICD-10-CM   1. Paroxysmal atrial  fibrillation (HCC)  I48.0 EKG 12-Lead  2. Essential hypertension  I10    CHA2DS2-VASc Score is 4.  Yearly risk of stroke: 4.8% (A, F, HTN).  Score of 1=0.6; 2=2.2; 3=3.2; 4=4.8; 5=7.2; 6=9.8; 7=>9.8) -(CHF; HTN; vasc disease DM,  Female = 1; Age <65 =0; 65-74 = 1,  >75 =2; stroke/embolism= 2).    Recommendations:   [name]  is a 86 y.o. female  paroxysmal atrial fibrillation who has had atrial fibrillation ablation x 2, essential hypertension, mild orthostatic hypotension and hyperlipidemia. She had maintained sinus rhythm on high dose sotalol in spite of her age needing higher doses. She experiences extreme fatigue and shortness of breath with A. Fib onset.   She has normal renal function.  I reviewed her external labs, CBC has remained stable.  No bleeding diathesis on Coumadin.  Blood pressure is also well controlled.  INR is being managed by PCP, patient  is on stable dose of Coumadin.  I'll see her back in 6 months for follow-up.   Adrian Prows, MD, [facility] 06/03/2020, 11:32 AM Office: [phone]

## 2020-06-25 ENCOUNTER — Encounter: Payer: Self-pay | Admitting: Cardiology

## 2020-06-25 ENCOUNTER — Other Ambulatory Visit: Payer: Self-pay | Admitting: Cardiology

## 2020-06-25 DIAGNOSIS — I480 Paroxysmal atrial fibrillation: Secondary | ICD-10-CM

## 2020-06-25 NOTE — Telephone Encounter (Signed)
Called pharmacy and requested Rx to be sent to PCP office

## 2020-06-26 ENCOUNTER — Other Ambulatory Visit: Payer: Self-pay | Admitting: Cardiology

## 2020-06-26 DIAGNOSIS — I480 Paroxysmal atrial fibrillation: Secondary | ICD-10-CM

## 2020-06-26 MED ORDER — WARFARIN SODIUM 2.5 MG PO TABS: 2.5000 mg | ORAL_TABLET | Freq: Every day | ORAL | 3 refills | Status: DC

## 2020-07-27 ENCOUNTER — Other Ambulatory Visit: Payer: Self-pay | Admitting: Cardiology

## 2020-07-27 DIAGNOSIS — I480 Paroxysmal atrial fibrillation: Secondary | ICD-10-CM

## 2020-07-29 NOTE — Telephone Encounter (Signed)
From pt

## 2020-12-04 ENCOUNTER — Ambulatory Visit: Payer: Medicare Other | Admitting: Cardiology

## 2020-12-04 NOTE — Progress Notes (Deleted)
Primary Physician/Referring:  Prince Solian, MD  Patient ID: [name], female    DOB: 1928-05-06, 87 y.o.   MRN: [id number]  Subjective   No chief complaint on file.   HPI: [name]  is a 87 y.o. female  paroxysmal atrial fibrillation who has had atrial fibrillation ablation x 2, essential hypertension, mild orthostatic hypotension and hyperlipidemia. She had maintained sinus rhythm on high dose sotalol in spite of her age needing higher doses. No bleeding diathesis or dark, tarry stools. Coumadin is being managed by Dr. Dagmar Hait. She denies any chest pain, shortness of breath. No new symptoms. No recent fall.   Past Medical History:  Diagnosis Date  . A-fib ([place])    On chronic anticoagulation  . Allergy   . Dysrhythmia    hx of atrial fibrilation  . GERD (gastroesophageal reflux disease)   . Headache(784.0)   . Hyperlipidemia   . Hypertension   . Osteoporosis   . Peripheral vascular disease ([place])   . Pneumonia   . PONV (postoperative nausea and vomiting)     Past Surgical History:  Procedure Laterality Date  . BUNIONECTOMY    . CARDIAC ELECTROPHYSIOLOGY MAPPING AND ABLATION    . FRACTURE SURGERY    . HEMIARTHROPLASTY HIP Right 04/08/2014  . HIP ARTHROPLASTY Right 04/08/2014   Procedure: ARTHROPLASTY BIPOLAR HIP;  Surgeon: Mcarthur Rossetti, MD;  Location: [place];  Service: Orthopedics;  Laterality: Right;   Social History   Tobacco Use  . Smoking status: Former Smoker    Packs/day: 0.50    Years: 4.00    Pack years: 2.00    Types: Cigarettes    Quit date: 1952    Years since quitting: 70.1  . Smokeless tobacco: Former Network engineer Use Topics  . Alcohol use: Yes    Comment: occas wine    Family History  Problem Relation Age of Onset  . Aneurysm Mother   . Tuberculosis Father     Review of Systems  Cardiovascular: Negative for dyspnea on exertion and leg swelling.  Musculoskeletal: Positive for arthritis.  Gastrointestinal: Negative for melena.   Neurological: Positive for dizziness (occasional).   Objective  There were no vitals taken for this visit. There is no height or weight on file to calculate BMI.   Physical Exam Constitutional:      General: She is not in acute distress.    Appearance: She is well-developed.  Cardiovascular:     Rate and Rhythm: Normal rate and regular rhythm.     Heart sounds: Normal heart sounds. No murmur heard. No gallop.      Comments: She is mild bilateral superficial varicose veins in the lower extremities.  Carotid pulses are normal, except for 1+ pedal pulses, vascular exam is normal.  No leg edema. Pulmonary:     Effort: Pulmonary effort is normal.     Breath sounds: Normal breath sounds.  Abdominal:     General: Bowel sounds are normal.     Palpations: Abdomen is soft.  Musculoskeletal:        General: Normal range of motion.    Radiology: No results found.  Laboratory examination:   Labs 11/26/2020:    Serum glucose 114 mg, BUN 19, creatinine 1.1, EGFR 46 mL, potassium 4.5, sodium 137.  Labs 11/26/2020:   Serum glucose 114 mg, BUN 19, creatinine 1.1, EGFR 46 mL, potassium 4.5, sodium 137.   Cholesterol, total 170.000 m 02/22/2020 HDL 57 MG/DL 02/22/2020 LDL 96.000 mg 02/22/2020  Triglycerides 86.000 02/22/2020  A1C 5.700 % 02/22/2020 TSH 3.940 02/22/2020  Hemoglobin 15.300 g/d 02/22/2020 INR 2.000 05/08/2020  Creatinine, Serum 1.100 mg/ 04/16/2020 ALT (SGPT) 19.000 uni 02/22/2020  Cholesterol, total 157.000 m 02/07/2019 HDL 59 MG/DL 02/07/2019 LDL 85.000 mg 02/07/2019 Triglycerides 67.000 02/07/2019  A1C 5.700 % 02/07/2019; TSH 1.990 02/07/2019  Hemoglobin 15.400 g/ 11/15/2019; INR 2.700 11/15/2019 Creatinine, Serum 0.800 mg/ 11/15/2019 ALT (SGPT) 21.000 uni 02/07/2019  Labs 01/30/19/20: Serum glucose 16 mg, BUN 20, creatinine 0.8, eGFR 67 mL, potassium 4.6, CMP otherwise normal.  HB 14.2/HCT 46.7, platelets 154, normal indicis.   Total cholesterol 157, triglycerides 67, HDL 59,  LDL 85.  TSH normal.  Vitamin D 46.  INR 1.9.  A1c 5.7%.  Medications   Current Outpatient Medications  Medication Instructions  . acetaminophen (TYLENOL) 500 mg, Oral, Every 6 hours PRN  . amLODipine (NORVASC) 2.5 mg, Oral, Daily  . diphenoxylate-atropine (LOMOTIL) 2.5-0.025 MG tablet Take one tablet 1-2 times daily.  Marland Kitchen EPINEPHrine 0.3 mg/0.3 mL IJ SOAJ injection Intramuscular  . fexofenadine (ALLEGRA) 180 mg, Oral, Daily PRN  . potassium chloride (K-DUR) 10 MEQ tablet 10 mEq, 2 times daily  . pravastatin (PRAVACHOL) 40 mg, Oral, Daily  . risedronate (ACTONEL) 35 mg, Oral, Every 7 days, with water on empty stomach, nothing by mouth or lie down for next 30 minutes.   . sotalol (BETAPACE) 120 MG tablet TAKE 1&1/2 TABLETS BY MOUTH 2 TIMES DAILY.  . traMADol (ULTRAM) 50 mg, Oral, Every 8 hours PRN, for pain  . venlafaxine (EFFEXOR) 75 mg, Daily  . Vitamin D (Ergocalciferol) (DRISDOL) 50,000 Units, Every 7 days  . warfarin (COUMADIN) 2.5 mg, Oral, Daily-1600, As directed by your MD   Cardiac Studies:   Echocardiogram 02/13/2016: Left ventricle cavity is normal in size. Mild concentric hypertrophy of the left ventricle. Normal global wall motion. Doppler evidence of grade I (impaired) diastolic dysfunction. Calculated EF 60%. Left atrial cavity is severely dilated at 5.0 cm. Right atrial cavity is mildly dilated. Moderate aortic regurgitation. Mild mitral regurgitation. Moderate tricuspid regurgitation. Mild to moderate pulmonary hypertension. Pulmonary artery systolic pressure is estimated at 39 mm Hg. Mild pulmonic regurgitation.  EKG:  EKG 06/03/2020: Sinus rhythm with first-degree block at rate of 62 bpm, normal axis, no evidence of ischemia.  Normal QT interval.   EKG 12/04/1019: Sinus rhythm with first-degree AV block at the rate of 62 bpm, normal axis, baseline artifact, nonspecific T abnormality.   No significant change from  EKG 04/25/2019.  Assessment     ICD-10-CM   1.  Paroxysmal atrial fibrillation (HCC)  I48.0   2. Essential hypertension  I10   3. Hypercholesteremia  E78.00    CHA2DS2-VASc Score is 4.  Yearly risk of stroke: 4.8% (A, F, HTN).  Score of 1=0.6; 2=2.2; 3=3.2; 4=4.8; 5=7.2; 6=9.8; 7=>9.8) -(CHF; HTN; vasc disease DM,  Female = 1; Age <65 =0; 65-74 = 1,  >75 =2; stroke/embolism= 2).    Recommendations:   [name]  is a 87 y.o. female  paroxysmal atrial fibrillation who has had atrial fibrillation ablation x 2, essential hypertension, mild orthostatic hypotension and hyperlipidemia. She had maintained sinus rhythm on high dose sotalol in spite of her age needing higher doses. She experiences extreme fatigue and shortness of breath with A. Fib onset.   She has normal renal function.  I reviewed her external labs, CBC has remained stable.  No bleeding diathesis on Coumadin.  Blood pressure is also well controlled.  INR is  being managed by PCP, patient is on stable dose of Coumadin.  I'll see her back in 6 months for follow-up.   Adrian Prows, MD, [facility] 12/04/2020, 8:09 AM Office: [phone]

## 2020-12-17 ENCOUNTER — Other Ambulatory Visit: Payer: Self-pay | Admitting: Cardiology

## 2020-12-17 DIAGNOSIS — F3289 Other specified depressive episodes: Secondary | ICD-10-CM

## 2020-12-17 DIAGNOSIS — E876 Hypokalemia: Secondary | ICD-10-CM

## 2020-12-17 MED ORDER — VENLAFAXINE HCL ER 75 MG PO CP24
75.0000 mg | ORAL_CAPSULE | Freq: Every day | ORAL | 3 refills | Status: DC
Start: 2020-12-17 — End: 2021-10-07

## 2020-12-17 MED ORDER — POTASSIUM CHLORIDE ER 10 MEQ PO TBCR: 10.0000 meq | EXTENDED_RELEASE_TABLET | Freq: Two times a day (BID) | ORAL | 3 refills | Status: AC

## 2020-12-17 MED ORDER — VENLAFAXINE HCL 75 MG PO TABS: 75.0000 mg | ORAL_TABLET | Freq: Every day | ORAL | 0 refills | Status: DC

## 2021-02-07 ENCOUNTER — Other Ambulatory Visit: Payer: Self-pay | Admitting: Cardiology

## 2021-02-07 DIAGNOSIS — I10 Essential (primary) hypertension: Secondary | ICD-10-CM

## 2021-02-08 ENCOUNTER — Other Ambulatory Visit: Payer: Self-pay | Admitting: Cardiology

## 2021-02-10 ENCOUNTER — Other Ambulatory Visit (HOSPITAL_COMMUNITY): Payer: Self-pay | Admitting: Family Medicine

## 2021-02-10 ENCOUNTER — Ambulatory Visit (HOSPITAL_COMMUNITY)
Admission: RE | Admit: 2021-02-10 | Discharge: 2021-02-10 | Disposition: A | Discharge status: Home / Self Care | Payer: Medicare Other | Source: Ambulatory Visit | Attending: Family Medicine | Admitting: Family Medicine

## 2021-02-10 ENCOUNTER — Other Ambulatory Visit: Payer: Self-pay

## 2021-02-10 DIAGNOSIS — I480 Paroxysmal atrial fibrillation: Secondary | ICD-10-CM

## 2021-04-30 ENCOUNTER — Ambulatory Visit: Payer: Medicare Other | Admitting: Cardiology

## 2021-04-30 ENCOUNTER — Encounter: Payer: Self-pay | Admitting: Cardiology

## 2021-04-30 ENCOUNTER — Other Ambulatory Visit: Payer: Self-pay

## 2021-04-30 VITALS — BP 123/71 | HR 68 | Temp 97.3°F | Resp 16 | Ht 68.0 in | Wt 150.0 lb

## 2021-04-30 DIAGNOSIS — I10 Essential (primary) hypertension: Secondary | ICD-10-CM

## 2021-04-30 DIAGNOSIS — I480 Paroxysmal atrial fibrillation: Secondary | ICD-10-CM

## 2021-04-30 DIAGNOSIS — B029 Zoster without complications: Secondary | ICD-10-CM

## 2021-04-30 MED ORDER — VALACYCLOVIR HCL 1 G PO TABS: 1000.0000 mg | ORAL_TABLET | Freq: Three times a day (TID) | ORAL | 0 refills | Status: DC

## 2021-04-30 NOTE — Progress Notes (Signed)
Primary Physician/Referring:  Prince Solian, MD  Patient ID: [name], female    DOB: 05-24-1928, 87 y.o.   MRN: [id number]  Subjective   Chief Complaint  Patient presents with   Atrial Fibrillation   Follow-up    HPI: [name]  is a 87 y.o. female  paroxysmal atrial fibrillation who has had atrial fibrillation ablation x 2, essential hypertension, mild orthostatic hypotension and hyperlipidemia. She had maintained sinus rhythm on high dose sotalol in spite of her age needing higher doses. No bleeding diathesis or dark, tarry stools. Coumadin is being managed by Dr. Dagmar Hait. She denies any chest pain, shortness of breath. No new symptoms. No recent fall.  I was called by her daughter about patient having chest pain, she has developed herpes zoster for the past 1 week or 10 days, and she also complained of retrosternal pain that is different last night.  Pain is continuous, in the area of herpes lesions.  She is also having discharge.  No foul smell.  Chest discomfort described as tightness in the middle of the chest, no other associated symptoms, described as mild and has been present all night.  No dyspnea, no cough.  Past Medical History:  Diagnosis Date   A-fib ([place])    On chronic anticoagulation   Allergy    Dysrhythmia    hx of atrial fibrilation   GERD (gastroesophageal reflux disease)    Headache(784.0)    Hyperlipidemia    Hypertension    Osteoporosis    Peripheral vascular disease (HCC)    Pneumonia    PONV (postoperative nausea and vomiting)     Past Surgical History:  Procedure Laterality Date   BUNIONECTOMY     CARDIAC ELECTROPHYSIOLOGY MAPPING AND ABLATION     FRACTURE SURGERY     HEMIARTHROPLASTY HIP Right 04/08/2014   HIP ARTHROPLASTY Right 04/08/2014   Procedure: ARTHROPLASTY BIPOLAR HIP;  Surgeon: Mcarthur Rossetti, MD;  Location: [place];  Service: Orthopedics;  Laterality: Right;   Social History   Tobacco Use   Smoking status: Former     Packs/day: 0.50    Years: 4.00    Pack years: 2.00    Types: Cigarettes    Quit date: 1952    Years since quitting: 70.5   Smokeless tobacco: Former  Substance Use Topics   Alcohol use: Yes    Comment: occas wine    Family History  Problem Relation Age of Onset   Aneurysm Mother    Tuberculosis Father     Review of Systems  Cardiovascular:  Positive for chest pain. Negative for dyspnea on exertion and leg swelling.  Skin:  Positive for rash.  Musculoskeletal:  Positive for arthritis.  Gastrointestinal:  Negative for melena.  Neurological:  Positive for dizziness (occasional).  Objective  Blood pressure 123/71, pulse 68, temperature (!) 97.3 F (36.3 C), temperature source Temporal, resp. rate 16, height _0  (1.727 m), weight 150 lb (68 kg), SpO2 95 %. Body mass index is 22.81 kg/m.  Vitals with BMI 04/30/2021 06/03/2020 01/16/2020  Height _1  _2  _3   Weight 150 lbs 156 lbs 154 lbs  BMI 22.81 32.12 24.82  Systolic 500 370 488  Diastolic 71 74 74  Pulse 68 67 78     Physical Exam Constitutional:      General: She is not in acute distress.    Appearance: She is well-developed.  Neck:     Vascular: No carotid bruit or JVD.  Cardiovascular:  Rate and Rhythm: Normal rate and regular rhythm.     Pulses:          Dorsalis pedis pulses are 1+ on the right side and 1+ on the left side.       Posterior tibial pulses are 1+ on the right side and 1+ on the left side.     Heart sounds: Normal heart sounds. No murmur heard.   No gallop.  Pulmonary:     Effort: Pulmonary effort is normal.     Breath sounds: Normal breath sounds.  Abdominal:     General: Bowel sounds are normal.     Palpations: Abdomen is soft.  Musculoskeletal:        General: No swelling. Normal range of motion.  Skin:    Comments: Extensive herpes zoster skin rash noted on the left torso.  Serosanguineous discharge present.  No foul smell.   Radiology: No results found.  Laboratory examination:    Cholesterol, total 178.000 m 02/26/2021 HDL 55.000 mg 02/26/2021 LDL 99.000 mg 02/26/2021 Triglycerides 120.000 m 02/26/2021  A1C 5.700 % 02/22/2020  Hemoglobin 15.300 g/d 02/22/2020  Creatinine, Serum 1.100 mg/ 04/16/2020 Potassium 5.900 mEq 04/22/2021 ALT (SGPT) 12.000 IU/ 02/26/2021  TSH 1.990 02/26/2021  Cholesterol, total 170.000 m 02/22/2020 HDL 57 MG/DL 02/22/2020 LDL 96.000 mg 02/22/2020 Triglycerides 86.000 02/22/2020  A1C 5.700 % 02/22/2020 TSH 3.940 02/22/2020  Hemoglobin 15.300 g/d 02/22/2020 INR 2.000 05/08/2020  Creatinine, Serum 1.100 mg/ 04/16/2020 ALT (SGPT) 19.000 uni 02/22/2020  Cholesterol, total 157.000 m 02/07/2019 HDL 59 MG/DL 02/07/2019 LDL 85.000 mg 02/07/2019 Triglycerides 67.000 02/07/2019  A1C 5.700 % 02/07/2019; TSH 1.990 02/07/2019  Hemoglobin 15.400 g/ 11/15/2019; INR 2.700 11/15/2019 Creatinine, Serum 0.800 mg/ 11/15/2019 ALT (SGPT) 21.000 uni 02/07/2019  Labs 01/30/19/20: Serum glucose 16 mg, BUN 20, creatinine 0.8, eGFR 67 mL, potassium 4.6, CMP otherwise normal.  HB 14.2/HCT 46.7, platelets 154, normal indicis.   Total cholesterol 157, triglycerides 67, HDL 59, LDL 85.  TSH normal.  Vitamin D 46.  INR 1.9.  A1c 5.7%.  Medications   Current Outpatient Medications  Medication Instructions   acetaminophen (TYLENOL) 500 mg, Oral, Every 6 hours PRN   amLODipine (NORVASC) 2.5 MG tablet TAKE 1 TABLET BY MOUTH EVERY DAY   EPINEPHrine 0.3 mg/0.3 mL IJ SOAJ injection Intramuscular   fexofenadine (ALLEGRA) 180 mg, Oral, Daily PRN   potassium chloride (KLOR-CON) 10 MEQ tablet 10 mEq, Oral, 2 times daily   pravastatin (PRAVACHOL) 40 mg, Oral, Daily   risedronate (ACTONEL) 35 mg, Oral, Every 7 days, with water on empty stomach, nothing by mouth or lie down for next 30 minutes.    sotalol (BETAPACE) 120 MG tablet TAKE 1&1/2 TABLETS BY MOUTH 2 TIMES DAILY.   traMADol (ULTRAM) 50 mg, Oral, Every 8 hours PRN, for pain   valACYclovir (VALTREX) 1,000 mg, Oral, 3 times daily    venlafaxine XR (EFFEXOR-XR) 75 mg, Oral, Daily with breakfast   Vitamin D (Ergocalciferol) (DRISDOL) 50,000 Units, Every 7 days   warfarin (COUMADIN) 2.5 mg, Oral, Daily-1600, As directed by your MD   Cardiac Studies:   Echocardiogram 02/13/2016: Left ventricle cavity is normal in size. Mild concentric hypertrophy of the left ventricle. Normal global wall motion. Doppler evidence of grade I (impaired) diastolic dysfunction. Calculated EF 60%. Left atrial cavity is severely dilated at 5.0 cm. Right atrial cavity is mildly dilated. Moderate aortic regurgitation. Mild mitral regurgitation. Moderate tricuspid regurgitation. Mild to moderate pulmonary hypertension. Pulmonary artery systolic pressure is estimated at 39 mm  Hg. Mild pulmonic regurgitation.  Carotid artery duplex 02/10/2021: Right Carotid: There is no evidence of stenosis in the right ICA. Left Carotid: There is no evidence of stenosis in the left ICA. Vertebrals:  Bilateral vertebral arteries demonstrate antegrade flow. Subclavians: Normal flow hemodynamics were seen in bilateral subclavian  arteries.  EKG:  EKG 04/30/2021: Sinus rhythm with first-degree AV block at rate of 70 bpm, left atrial enlargement, left axis deviation.  Heart baseline artifact but otherwise normal.  Normal QT interval.  No change from 06/03/2020.   EKG 12/04/1019: Sinus rhythm with first-degree AV block at the rate of 62 bpm, normal axis, baseline artifact, nonspecific T abnormality.   No significant change from  EKG 04/25/2019.  Assessment     ICD-10-CM   1. Paroxysmal A-fib (HCC)  I48.0 EKG 12-Lead    2. Herpes zoster without complication  Y23.3 valACYclovir (VALTREX) 1000 MG tablet    3. Essential hypertension  I10      CHA2DS2-VASc Score is 4.  Yearly risk of stroke: 4.8% (A, F, HTN).  Score of 1=0.6; 2=2.2; 3=3.2; 4=4.8; 5=7.2; 6=9.8; 7=>9.8) -(CHF; HTN; vasc disease DM,  Female = 1; Age <65 =0; 65-74 = 1,  >75 =2; stroke/embolism= 2).     Recommendations:   [name]  is a 87 y.o. female  paroxysmal atrial fibrillation who has had atrial fibrillation ablation x 2, essential hypertension, mild orthostatic hypotension and hyperlipidemia. She had maintained sinus rhythm on high dose sotalol in spite of her age needing higher doses. She experiences extreme fatigue and shortness of breath with A. Fib onset.  I saw her on a urgent basis as her daughter had called stating that she has been having chest pain but she is also being actively treated for herpes zoster.  Chest pain symptoms clearly are related to herpes zoster.  EKG revealed sinus rhythm with first-degree AV block without evidence of ischemia.  She has extensive excoriation and significant amount of discharge that is seeping through her skin throughout the left torso in the inframammary region, all the way to the back, she is just finished 1 week course of antiviral therapy.  She still having active lesions, may need continued therapy and may need further evaluation if she start having any foul-smelling discharge.  Daughter educated about this.  I did get in touch with Dr. Dagmar Hait, [facility] and we decided to continue valacyclovir for additional 1 week.  Reviewed her labs, her patient she recently had hyponatremia and hypokalemia but this is all resolved.  From cardiac standpoint she remained stable, she is tolerating anticoagulation which is being managed by PCP.  I will see her back in a year or sooner if problems.   Adrian Prows, MD, [facility] 04/30/2021, 5:10 PM Office: [phone]

## 2021-05-08 ENCOUNTER — Emergency Department (HOSPITAL_BASED_OUTPATIENT_CLINIC_OR_DEPARTMENT_OTHER): Payer: Medicare Other

## 2021-05-08 ENCOUNTER — Encounter (HOSPITAL_BASED_OUTPATIENT_CLINIC_OR_DEPARTMENT_OTHER): Payer: Self-pay

## 2021-05-08 ENCOUNTER — Other Ambulatory Visit: Payer: Self-pay

## 2021-05-08 ENCOUNTER — Emergency Department (HOSPITAL_BASED_OUTPATIENT_CLINIC_OR_DEPARTMENT_OTHER)
Admission: EM | Admit: 2021-05-08 | Discharge: 2021-05-09 | Disposition: A | Discharge status: Home / Self Care | Payer: Medicare Other | Attending: Emergency Medicine | Admitting: Emergency Medicine

## 2021-05-08 DIAGNOSIS — Z79899 Other long term (current) drug therapy: Secondary | ICD-10-CM | POA: Diagnosis not present

## 2021-05-08 DIAGNOSIS — R410 Disorientation, unspecified: Secondary | ICD-10-CM | POA: Diagnosis not present

## 2021-05-08 DIAGNOSIS — L304 Erythema intertrigo: Secondary | ICD-10-CM | POA: Diagnosis not present

## 2021-05-08 DIAGNOSIS — Z96641 Presence of right artificial hip joint: Secondary | ICD-10-CM | POA: Insufficient documentation

## 2021-05-08 DIAGNOSIS — Z7901 Long term (current) use of anticoagulants: Secondary | ICD-10-CM | POA: Diagnosis not present

## 2021-05-08 DIAGNOSIS — Z91040 Latex allergy status: Secondary | ICD-10-CM | POA: Insufficient documentation

## 2021-05-08 DIAGNOSIS — I10 Essential (primary) hypertension: Secondary | ICD-10-CM | POA: Diagnosis not present

## 2021-05-08 DIAGNOSIS — B029 Zoster without complications: Secondary | ICD-10-CM | POA: Diagnosis not present

## 2021-05-08 DIAGNOSIS — Z87891 Personal history of nicotine dependence: Secondary | ICD-10-CM | POA: Diagnosis not present

## 2021-05-08 LAB — COMPREHENSIVE METABOLIC PANEL
ALT: 7 U/L (ref 0–44)
AST: 13 U/L — ABNORMAL LOW (ref 15–41)
Albumin: 3.6 g/dL (ref 3.5–5.0)
Alkaline Phosphatase: 44 U/L (ref 38–126)
Anion gap: 9 (ref 5–15)
BUN: 27 mg/dL — ABNORMAL HIGH (ref 8–23)
CO2: 23 mmol/L (ref 22–32)
Calcium: 9.3 mg/dL (ref 8.9–10.3)
Chloride: 105 mmol/L (ref 98–111)
Creatinine, Ser: 0.75 mg/dL (ref 0.44–1.00)
GFR, Estimated: >60 mL/min (ref >60)
Glucose, Bld: 132 mg/dL — ABNORMAL HIGH (ref 70–99)
Potassium: 4.3 mmol/L (ref 3.5–5.1)
Sodium: 137 mmol/L (ref 135–145)
Total Bilirubin: 0.5 mg/dL (ref 0.3–1.2)
Total Protein: 6.2 g/dL — ABNORMAL LOW (ref 6.5–8.1)

## 2021-05-08 LAB — CBC
HCT: 44.7 % (ref 36.0–46.0)
Hemoglobin: 14.9 g/dL (ref 12.0–15.0)
MCH: 29.8 pg (ref 26.0–34.0)
MCHC: 33.3 g/dL (ref 30.0–36.0)
MCV: 89.4 fL (ref 80.0–100.0)
Platelets: 258 10*3/uL (ref 150–400)
RBC: 5 MIL/uL (ref 3.87–5.11)
RDW: 14.6 % (ref 11.5–15.5)
WBC: 8.5 10*3/uL (ref 4.0–10.5)
nRBC: 0 % (ref 0.0–0.2)

## 2021-05-08 LAB — CBG MONITORING, ED: Glucose-Capillary: 132 mg/dL — ABNORMAL HIGH (ref 70–99)

## 2021-05-08 MED ORDER — SODIUM CHLORIDE 0.9 % IV BOLUS
500.0000 mL | Freq: Once | INTRAVENOUS | Status: AC
  Administered 2021-05-08: 500 mL via INTRAVENOUS

## 2021-05-08 NOTE — ED Triage Notes (Signed)
Per daughter, pt's son noticed that the pt was  not acting  right. She wasn't  eating normally at dinner  and having altered mental status - she response slow to question. Onset 1830  Pt has shingles  on doxycyxycline since Monday. States has hx high potassium and low sodium.

## 2021-05-08 NOTE — ED Provider Notes (Signed)
[place] EMERGENCY DEPT Provider Note   CSN: [id number] Arrival date & time: 05/08/21  2118     History Chief Complaint  Patient presents with   Altered Mental Status    [name] is a 87 y.o. female.  Patient presents to the emergency department for evaluation of confusion.  Patient developed mild confusion this evening at home.  She was with her son who reported that she seemed off and then he noticed that she seemed confused about how to eat her spaghetti.  Daughter went over and agreed that she was not quite right.  She knew where she was, recognized her family members but did not know the date or who the president was.  This is unusual for her, normally she reads the lesser journal every day.  Daughter reports that this has happened before when her sodium was low in the past.  Patient has been battling shingles for several weeks.  She was seen by her primary doctor this past week for a recheck and they thought she had a superinfection in the area, started her on doxycycline.      Past Medical History:  Diagnosis Date   A-fib [facility])    On chronic anticoagulation   Allergy    Dysrhythmia    hx of atrial fibrilation   GERD (gastroesophageal reflux disease)    Headache(784.0)    Hyperlipidemia    Hypertension    Osteoporosis    Peripheral vascular disease (HCC)    Pneumonia    PONV (postoperative nausea and vomiting)     Patient Active Problem List   Diagnosis Date Noted   Varicose veins of right lower extremity with complications 99991111   Acute blood loss anemia 04/12/2014   Allergic rhinitis 04/12/2014   Depression 04/12/2014   Aspiration pneumonia ([place]) 04/10/2014   Hip fracture ([place]) 04/08/2014   Fracture of femoral neck, right ([place]) 04/08/2014   Hyperlipidemia    Osteoporosis    Hypertension    A-fib [facility])     Past Surgical History:  Procedure Laterality Date   BUNIONECTOMY     CARDIAC ELECTROPHYSIOLOGY MAPPING AND ABLATION      FRACTURE SURGERY     HEMIARTHROPLASTY HIP Right 04/08/2014   HIP ARTHROPLASTY Right 04/08/2014   Procedure: ARTHROPLASTY BIPOLAR HIP;  Surgeon: Mcarthur Rossetti, MD;  Location: [facility];  Service: Orthopedics;  Laterality: Right;     OB History   No obstetric history on file.     Family History  Problem Relation Age of Onset   Aneurysm Mother    Tuberculosis Father     Social History   Tobacco Use   Smoking status: Former    Packs/day: 0.50    Years: 4.00    Pack years: 2.00    Types: Cigarettes    Quit date: 1952    Years since quitting: 70.6   Smokeless tobacco: Former  Scientific laboratory technician Use: Never used  Substance Use Topics   Alcohol use: Yes    Comment: occas wine   Drug use: No    Home Medications Prior to Admission medications   Medication Sig Start Date End Date Taking? Authorizing Provider  acetaminophen (TYLENOL) 500 MG tablet Take 500 mg by mouth every 6 (six) hours as needed for mild pain.    [provider]  amLODipine (NORVASC) 2.5 MG tablet TAKE 1 TABLET BY MOUTH EVERY DAY 02/08/21   Adrian Prows, MD  EPINEPHrine 0.3 mg/0.3 mL IJ SOAJ injection Inject  into the muscle. 04/30/18   [provider]  fexofenadine (ALLEGRA) 180 MG tablet Take 180 mg by mouth daily as needed.     [provider]  potassium chloride (KLOR-CON) 10 MEQ tablet Take 1 tablet (10 mEq total) by mouth 2 (two) times daily. 12/17/20   Adrian Prows, MD  pravastatin (PRAVACHOL) 40 MG tablet Take 40 mg by mouth daily.    [provider]  risedronate (ACTONEL) 35 MG tablet Take 35 mg by mouth every 7 (seven) days. with water on empty stomach, nothing by mouth or lie down for next 30 minutes.    [provider]  sotalol (BETAPACE) 120 MG tablet TAKE 1&1/2 TABLETS BY MOUTH 2 TIMES DAILY. 07/29/20   Adrian Prows, MD  traMADol (ULTRAM) 50 MG tablet Take 50 mg by mouth every 8 (eight) hours as needed. for pain 02/16/19   [provider]  valACYclovir  (VALTREX) 1000 MG tablet Take 1 tablet (1,000 mg total) by mouth 3 (three) times daily. 04/30/21   Adrian Prows, MD  venlafaxine XR (EFFEXOR-XR) 75 MG 24 hr capsule Take 1 capsule (75 mg total) by mouth daily with breakfast. 12/17/20   Adrian Prows, MD  Vitamin D, Ergocalciferol, (DRISDOL) 50000 UNITS CAPS capsule Take 50,000 Units by mouth every 7 (seven) days. Wednesday    [provider]  warfarin (COUMADIN) 2.5 MG tablet Take 1 tablet (2.5 mg total) by mouth daily at 4 PM. As directed by your MD 06/26/20   Adrian Prows, MD    Allergies    Contrast media [iodinated diagnostic agents], Dronedarone, Other, Amiodarone, Flecainide, Latex, Multaq [dronedarone hydrochloride], and Sulfa antibiotics  Review of Systems   Review of Systems  Skin:  Positive for rash.  Psychiatric/Behavioral:  Positive for confusion.   All other systems reviewed and are negative.  Physical Exam Updated Vital Signs BP 133/77 (BP Location: Right Arm)   Pulse 60   Temp 98.3 F (36.8 C) (Oral)   Resp 20   Ht '5\' 8"'$  (1.727 m)   Wt 68 kg   SpO2 98%   BMI 22.79 kg/m   Physical Exam Vitals and nursing note reviewed.  Constitutional:      General: She is not in acute distress.    Appearance: Normal appearance. She is well-developed.  HENT:     Head: Normocephalic and atraumatic.     Right Ear: Hearing normal.     Left Ear: Hearing normal.     Nose: Nose normal.  Eyes:     Conjunctiva/sclera: Conjunctivae normal.     Pupils: Pupils are equal, round, and reactive to light.  Cardiovascular:     Rate and Rhythm: Regular rhythm.     Heart sounds: S1 normal and S2 normal. No murmur heard.   No friction rub. No gallop.  Pulmonary:     Effort: Pulmonary effort is normal. No respiratory distress.     Breath sounds: Normal breath sounds.  Chest:     Chest wall: No tenderness.  Abdominal:     General: Bowel sounds are normal.     Palpations: Abdomen is soft.     Tenderness: There is no abdominal tenderness.  There is no guarding or rebound. Negative signs include Murphy's sign and McBurney's sign.     Hernia: No hernia is present.  Musculoskeletal:        General: Normal range of motion.     Cervical back: Normal range of motion and neck supple.  Skin:    General: Skin  is warm and dry.     Findings: Rash (Scattered scabs left anterior chest wall, lateral chest wall to back) present.     Comments: Healing residual scabs of shingles noted on left chest.  Additionally, patient has significant weeping candidal infection under both breasts  Neurological:     Mental Status: She is alert. She is confused.     GCS: GCS eye subscore is 4. GCS verbal subscore is 4. GCS motor subscore is 6.     Cranial Nerves: No cranial nerve deficit.     Sensory: No sensory deficit.     Coordination: Coordination normal.  Psychiatric:        Speech: Speech normal.        Behavior: Behavior normal.        Thought Content: Thought content normal.    ED Results / Procedures / Treatments   Labs (all labs ordered are listed, but only abnormal results are displayed) Labs Reviewed  COMPREHENSIVE METABOLIC PANEL - Abnormal; Notable for the following components:      Result Value   Glucose, Bld 132 (*)    BUN 27 (*)    Total Protein 6.2 (*)    AST 13 (*)    All other components within normal limits  PROTIME-INR - Abnormal; Notable for the following components:   Prothrombin Time 30.8 (*)    INR 3.0 (*)    All other components within normal limits  CBG MONITORING, ED - Abnormal; Notable for the following components:   Glucose-Capillary 132 (*)    All other components within normal limits  CBC  URINALYSIS, ROUTINE W REFLEX MICROSCOPIC    EKG EKG Interpretation  Date/Time:  Thursday May 08 2021 22:07:52 EDT Ventricular Rate:  60 PR Interval:  271 QRS Duration: 99 QT Interval:  473 QTC Calculation: 473 R Axis:   40 Text Interpretation: Sinus or ectopic atrial rhythm Prolonged PR interval Confirmed by  Orpah Greek [phone]) on 05/08/2021 10:57:20 PM  Radiology CT HEAD WO CONTRAST  Result Date: 05/08/2021 CLINICAL DATA:  Mental status change, unknown cause EXAM: CT HEAD WITHOUT CONTRAST TECHNIQUE: Contiguous axial images were obtained from the base of the skull through the vertex without intravenous contrast. COMPARISON:  Head CT 03/02/2019.  Brain MRI 08/24/2016 FINDINGS: Brain: No acute hemorrhage. No evidence of ischemia. Partially calcified extra-axial mass in the right frontal lobe measuring 2.4 x 2.2 cm has surrounding vasogenic edema. This is not significantly changed from prior exam allowing for differences in caliper placement and measurement technique. Stable degree of generalized atrophy and chronic small vessel ischemia. No hydrocephalus. No subdural collection. Vascular: Atherosclerosis of skullbase vasculature without hyperdense vessel or abnormal calcification. Skull: No fracture or focal lesion. Sinuses/Orbits: Chronic sphenoid sinus disease with opacification of the right greater than left sphenoid sinus and cortical thickening. Mastoid air cells are clear. Bilateral cataract resection. Other: None. IMPRESSION: 1. No acute intracranial abnormality. 2. Unchanged right frontal meningioma with associated vasogenic edema. 3. Stable atrophy and chronic small vessel ischemia. 4. Chronic sphenoid sinus disease. Electronically Signed   By: Keith Rake M.D.   On: 05/08/2021 23:57    Procedures Procedures   Medications Ordered in ED Medications  sodium chloride 0.9 % bolus 500 mL (500 mLs Intravenous New Bag/Given 05/08/21 2346)    ED Course  I have reviewed the triage vital signs and the nursing notes.  Pertinent labs & imaging results that were available during my care of the patient were reviewed by me and considered  in my medical decision making (see chart for details).    MDM Rules/Calculators/A&P                          Patient presented to the emergency department  for evaluation of confusion.  Patient reportedly had some mild confusion at home earlier.  Family members report that she has had similar episodes in the past with abnormal electrolytes.  Patient is clearing, back to her baseline now.  She is on Coumadin.  INR is slightly supratherapeutic.  She will hold her Coumadin for 1 day because of this.  Head CT was performed to evaluate for intracranial bleed causing her confusion.  This was negative.  Remainder of work-up was unremarkable.  Discussed with the daughter the possible need for lumbar puncture.  She does have a recent diagnosis of herpes zoster.  Examining the area, however, reveals that it is almost completely healed.  Doubt herpes encephalitis.  Daughter does not wish to pursue any "heroic measures".  As she has back to her normal baseline and is therefore felt reasonable to discharge her.  She does have fairly significant intertrigo under both breasts.  I assume this is what was thought to possibly be superinfection of her zoster rash earlier.  She is almost finished with the doxycycline.  Can finish that off but also will treat her yeast infection with Lotrisone.  IFinal Clinical Impression(s) / ED Diagnoses Final diagnoses:  Intertrigo  Confusion    Rx / DC Orders ED Discharge Orders     None        Isel Skufca, Gwenyth Allegra, MD 05/11/21 [phone]

## 2021-05-08 NOTE — ED Notes (Signed)
Patient transported to CT 

## 2021-05-08 NOTE — ED Notes (Signed)
States patient has developed onset of confusion this evening at about 1830.  Oriented to person and place but not time.  Daughter states patient has become generally weaker since onset of shingles this week. States had become confused in past due to low sodium levels

## 2021-05-09 LAB — URINALYSIS, ROUTINE W REFLEX MICROSCOPIC
Bilirubin Urine: NEGATIVE
Glucose, UA: NEGATIVE mg/dL
Hgb urine dipstick: NEGATIVE
Ketones, ur: NEGATIVE mg/dL
Leukocytes,Ua: NEGATIVE
Nitrite: NEGATIVE
Protein, ur: NEGATIVE mg/dL
Specific Gravity, Urine: 1.021 (ref 1.005–1.030)
pH: 5 (ref 5.0–8.0)

## 2021-05-09 LAB — PROTIME-INR
INR: 3 — ABNORMAL HIGH (ref 0.8–1.2)
Prothrombin Time: 30.8 seconds — ABNORMAL HIGH (ref 11.4–15.2)

## 2021-05-09 MED ORDER — CLOTRIMAZOLE-BETAMETHASONE 1-0.05 % EX CREA: TOPICAL_CREAM | CUTANEOUS | 0 refills | Status: DC

## 2021-05-09 NOTE — Discharge Instructions (Addendum)
Hold Coumadin 1 day.  Avoid Neurontin and Ultram, as these are likely causing some of the confusion.  Encourage oral hydration.  Finish out the doxycycline but start the Lotrisone right away for the yeast infection.  Once the skin is healed, if she is still having pain, you can try capsaicin cream topically on the area.

## 2021-06-06 ENCOUNTER — Other Ambulatory Visit: Payer: Self-pay | Admitting: Cardiology

## 2021-06-06 DIAGNOSIS — I480 Paroxysmal atrial fibrillation: Secondary | ICD-10-CM

## 2021-06-06 NOTE — Telephone Encounter (Signed)
Refill request

## 2021-07-10 DIAGNOSIS — B9689 Other specified bacterial agents as the cause of diseases classified elsewhere: Secondary | ICD-10-CM | POA: Insufficient documentation

## 2021-07-10 DIAGNOSIS — E559 Vitamin D deficiency, unspecified: Secondary | ICD-10-CM | POA: Insufficient documentation

## 2021-07-10 DIAGNOSIS — R413 Other amnesia: Secondary | ICD-10-CM | POA: Insufficient documentation

## 2021-07-10 DIAGNOSIS — H109 Unspecified conjunctivitis: Secondary | ICD-10-CM | POA: Insufficient documentation

## 2021-07-10 DIAGNOSIS — B0229 Other postherpetic nervous system involvement: Secondary | ICD-10-CM | POA: Insufficient documentation

## 2021-08-11 ENCOUNTER — Other Ambulatory Visit: Payer: Self-pay

## 2021-08-11 ENCOUNTER — Emergency Department (HOSPITAL_BASED_OUTPATIENT_CLINIC_OR_DEPARTMENT_OTHER)
Admission: EM | Admit: 2021-08-11 | Discharge: 2021-08-11 | Disposition: A | Discharge status: Home / Self Care | Payer: Medicare Other | Attending: Emergency Medicine | Admitting: Emergency Medicine

## 2021-08-11 ENCOUNTER — Encounter (HOSPITAL_BASED_OUTPATIENT_CLINIC_OR_DEPARTMENT_OTHER): Payer: Self-pay | Admitting: *Deleted

## 2021-08-11 ENCOUNTER — Emergency Department (HOSPITAL_BASED_OUTPATIENT_CLINIC_OR_DEPARTMENT_OTHER): Payer: Medicare Other | Admitting: Radiology

## 2021-08-11 DIAGNOSIS — Z7901 Long term (current) use of anticoagulants: Secondary | ICD-10-CM | POA: Insufficient documentation

## 2021-08-11 DIAGNOSIS — Z87891 Personal history of nicotine dependence: Secondary | ICD-10-CM | POA: Diagnosis not present

## 2021-08-11 DIAGNOSIS — F0390 Unspecified dementia without behavioral disturbance: Secondary | ICD-10-CM | POA: Diagnosis not present

## 2021-08-11 DIAGNOSIS — W19XXXA Unspecified fall, initial encounter: Secondary | ICD-10-CM | POA: Diagnosis not present

## 2021-08-11 DIAGNOSIS — Z79899 Other long term (current) drug therapy: Secondary | ICD-10-CM | POA: Insufficient documentation

## 2021-08-11 DIAGNOSIS — Z91040 Latex allergy status: Secondary | ICD-10-CM | POA: Insufficient documentation

## 2021-08-11 DIAGNOSIS — Z96641 Presence of right artificial hip joint: Secondary | ICD-10-CM | POA: Diagnosis not present

## 2021-08-11 DIAGNOSIS — S7011XA Contusion of right thigh, initial encounter: Secondary | ICD-10-CM | POA: Insufficient documentation

## 2021-08-11 DIAGNOSIS — I10 Essential (primary) hypertension: Secondary | ICD-10-CM | POA: Diagnosis not present

## 2021-08-11 DIAGNOSIS — R791 Abnormal coagulation profile: Secondary | ICD-10-CM | POA: Insufficient documentation

## 2021-08-11 DIAGNOSIS — S299XXA Unspecified injury of thorax, initial encounter: Secondary | ICD-10-CM | POA: Diagnosis present

## 2021-08-11 DIAGNOSIS — S2020XA Contusion of thorax, unspecified, initial encounter: Secondary | ICD-10-CM

## 2021-08-11 DIAGNOSIS — S20219A Contusion of unspecified front wall of thorax, initial encounter: Secondary | ICD-10-CM | POA: Insufficient documentation

## 2021-08-11 LAB — COMPREHENSIVE METABOLIC PANEL
ALT: 12 U/L (ref 0–44)
AST: 19 U/L (ref 15–41)
Albumin: 3.5 g/dL (ref 3.5–5.0)
Alkaline Phosphatase: 39 U/L (ref 38–126)
Anion gap: 9 (ref 5–15)
BUN: 17 mg/dL (ref 8–23)
CO2: 25 mmol/L (ref 22–32)
Calcium: 8.7 mg/dL — ABNORMAL LOW (ref 8.9–10.3)
Chloride: 104 mmol/L (ref 98–111)
Creatinine, Ser: 0.57 mg/dL (ref 0.44–1.00)
GFR, Estimated: >60 mL/min (ref >60)
Glucose, Bld: 95 mg/dL (ref 70–99)
Potassium: 4.2 mmol/L (ref 3.5–5.1)
Sodium: 138 mmol/L (ref 135–145)
Total Bilirubin: 0.6 mg/dL (ref 0.3–1.2)
Total Protein: 6.4 g/dL — ABNORMAL LOW (ref 6.5–8.1)

## 2021-08-11 LAB — CBC WITH DIFFERENTIAL/PLATELET
Abs Immature Granulocytes: 0.01 10*3/uL (ref 0.00–0.07)
Basophils Absolute: 0 10*3/uL (ref 0.0–0.1)
Basophils Relative: 0 %
Eosinophils Absolute: 0 10*3/uL (ref 0.0–0.5)
Eosinophils Relative: 1 %
HCT: 41.9 % (ref 36.0–46.0)
Hemoglobin: 13.5 g/dL (ref 12.0–15.0)
Immature Granulocytes: 0 %
Lymphocytes Relative: 22 %
Lymphs Abs: 0.9 10*3/uL (ref 0.7–4.0)
MCH: 30.3 pg (ref 26.0–34.0)
MCHC: 32.2 g/dL (ref 30.0–36.0)
MCV: 93.9 fL (ref 80.0–100.0)
Monocytes Absolute: 0.8 10*3/uL (ref 0.1–1.0)
Monocytes Relative: 19 %
Neutro Abs: 2.4 10*3/uL (ref 1.7–7.7)
Neutrophils Relative %: 58 %
Platelets: 123 10*3/uL — ABNORMAL LOW (ref 150–400)
RBC: 4.46 MIL/uL (ref 3.87–5.11)
RDW: 14 % (ref 11.5–15.5)
WBC: 4.2 10*3/uL (ref 4.0–10.5)
nRBC: 0 % (ref 0.0–0.2)

## 2021-08-11 LAB — PROTIME-INR
INR: 1.2 (ref 0.8–1.2)
Prothrombin Time: 15.6 seconds — ABNORMAL HIGH (ref 11.4–15.2)

## 2021-08-11 NOTE — ED Notes (Signed)
Purwick applied due to pt being weak, unsure if she can go to bathroom.

## 2021-08-11 NOTE — Discharge Instructions (Signed)
Call your doctor today about your low INR of 1.2.  They can help adjust your warfarin dosing.

## 2021-08-11 NOTE — ED Provider Notes (Signed)
[place] EMERGENCY DEPT Provider Note   CSN: [id number] Arrival date & time: 08/11/21  1011  LEVEL 5 CAVEAT - DEMENTIA   History Chief Complaint  Patient presents with   Naylin Burkle is a 87 y.o. female.  HPI 87 year old female presents with a right sided chest bruise.  This occurred on a fall 2 nights ago.  Patient did not seem to pass out or hit her head.  History is primarily from the caregiver who is at the bedside.  Patient has falls fairly often.  She has bruising to her right chest and call PCP but they told her to come to the ER.  Some bruising to the right thigh but patient has been ambulatory.  Some skin tears to the right forearm but no pain.  Patient reports that her pain is okay after her morning meds.  Otherwise patient has been in normal health according to caregiver.  Past Medical History:  Diagnosis Date   A-fib [facility])    On chronic anticoagulation   Allergy    Dysrhythmia    hx of atrial fibrilation   GERD (gastroesophageal reflux disease)    Headache(784.0)    Hyperlipidemia    Hypertension    Osteoporosis    Peripheral vascular disease (HCC)    Pneumonia    PONV (postoperative nausea and vomiting)     Patient Active Problem List   Diagnosis Date Noted   Varicose veins of right lower extremity with complications 40/98/1191   Acute blood loss anemia 04/12/2014   Allergic rhinitis 04/12/2014   Depression 04/12/2014   Aspiration pneumonia ([place]) 04/10/2014   Hip fracture ([place]) 04/08/2014   Fracture of femoral neck, right ([place]) 04/08/2014   Hyperlipidemia    Osteoporosis    Hypertension    A-fib [facility])     Past Surgical History:  Procedure Laterality Date   BUNIONECTOMY     CARDIAC ELECTROPHYSIOLOGY MAPPING AND ABLATION     FRACTURE SURGERY     HEMIARTHROPLASTY HIP Right 04/08/2014   HIP ARTHROPLASTY Right 04/08/2014   Procedure: ARTHROPLASTY BIPOLAR HIP;  Surgeon: Mcarthur Rossetti, MD;  Location: [place];  Service:  Orthopedics;  Laterality: Right;     OB History   No obstetric history on file.     Family History  Problem Relation Age of Onset   Aneurysm Mother    Tuberculosis Father     Social History   Tobacco Use   Smoking status: Former    Packs/day: 0.50    Years: 4.00    Pack years: 2.00    Types: Cigarettes    Quit date: 1952    Years since quitting: 70.8   Smokeless tobacco: Former  Scientific laboratory technician Use: Never used  Substance Use Topics   Alcohol use: Yes    Comment: occas wine   Drug use: No    Home Medications Prior to Admission medications   Medication Sig Start Date End Date Taking? Authorizing Provider  acetaminophen (TYLENOL) 500 MG tablet Take 500 mg by mouth every 6 (six) hours as needed for mild pain.    [provider]  amLODipine (NORVASC) 2.5 MG tablet TAKE 1 TABLET BY MOUTH EVERY DAY 02/08/21   Adrian Prows, MD  clotrimazole-betamethasone (LOTRISONE) cream Apply to affected area 2 times daily prn 05/09/21   Orpah Greek, MD  EPINEPHrine 0.3 mg/0.3 mL IJ SOAJ injection Inject into the muscle. 04/30/18   [provider]  fexofenadine (ALLEGRA) 180  MG tablet Take 180 mg by mouth daily as needed.     [provider]  potassium chloride (KLOR-CON) 10 MEQ tablet Take 1 tablet (10 mEq total) by mouth 2 (two) times daily. 12/17/20   Adrian Prows, MD  pravastatin (PRAVACHOL) 40 MG tablet Take 40 mg by mouth daily.    [provider]  risedronate (ACTONEL) 35 MG tablet Take 35 mg by mouth every 7 (seven) days. with water on empty stomach, nothing by mouth or lie down for next 30 minutes.    [provider]  sotalol (BETAPACE) 120 MG tablet TAKE 1&1/2 TABLETS BY MOUTH 2 TIMES DAILY. 06/06/21   Adrian Prows, MD  traMADol (ULTRAM) 50 MG tablet Take 50 mg by mouth every 8 (eight) hours as needed. for pain 02/16/19   [provider]  valACYclovir (VALTREX) 1000 MG tablet Take 1 tablet (1,000 mg total) by mouth 3 (three)  times daily. 04/30/21   Adrian Prows, MD  venlafaxine XR (EFFEXOR-XR) 75 MG 24 hr capsule Take 1 capsule (75 mg total) by mouth daily with breakfast. 12/17/20   Adrian Prows, MD  Vitamin D, Ergocalciferol, (DRISDOL) 50000 UNITS CAPS capsule Take 50,000 Units by mouth every 7 (seven) days. Wednesday    [provider]  warfarin (COUMADIN) 2.5 MG tablet Take 1 tablet (2.5 mg total) by mouth daily at 4 PM. As directed by your MD 06/26/20   Adrian Prows, MD    Allergies    Contrast media [iodinated diagnostic agents], Dronedarone, Other, Amiodarone, Flecainide, Latex, Multaq [dronedarone hydrochloride], and Sulfa antibiotics  Review of Systems   Review of Systems  Unable to perform ROS: Dementia   Physical Exam Updated Vital Signs BP (!) 164/99   Pulse 68   Temp 98.7 F (37.1 C) (Oral)   Resp 16   Wt 71.5 kg   SpO2 91%   BMI 23.97 kg/m   Physical Exam Vitals and nursing note reviewed.  Constitutional:      Appearance: She is well-developed.  HENT:     Head: Normocephalic and atraumatic.     Right Ear: External ear normal.     Left Ear: External ear normal.     Nose: Nose normal.  Eyes:     General:        Right eye: No discharge.        Left eye: No discharge.  Cardiovascular:     Rate and Rhythm: Normal rate and regular rhythm.     Pulses:          Radial pulses are 2+ on the right side.     Heart sounds: Normal heart sounds.  Pulmonary:     Effort: Pulmonary effort is normal.     Breath sounds: Normal breath sounds.  Chest:     Chest wall: Tenderness present. No deformity or swelling.       Comments: Right chest bruise just superior to breast. Mild tenderness Abdominal:     Palpations: Abdomen is soft.     Tenderness: There is no abdominal tenderness.  Musculoskeletal:     Right forearm: No tenderness.     Right wrist: No swelling, deformity or tenderness. Normal range of motion.     Right hand: No tenderness.     Right hip: No tenderness. Normal range of  motion.     Right upper leg: No tenderness.     Right knee: Normal range of motion. No tenderness.       Legs:  Skin:  General: Skin is warm and dry.  Neurological:     Mental Status: She is alert.  Psychiatric:        Mood and Affect: Mood is not anxious.    ED Results / Procedures / Treatments   Labs (all labs ordered are listed, but only abnormal results are displayed) Labs Reviewed  COMPREHENSIVE METABOLIC PANEL - Abnormal; Notable for the following components:      Result Value   Calcium 8.7 (*)    Total Protein 6.4 (*)    All other components within normal limits  CBC WITH DIFFERENTIAL/PLATELET - Abnormal; Notable for the following components:   Platelets 123 (*)    All other components within normal limits  PROTIME-INR - Abnormal; Notable for the following components:   Prothrombin Time 15.6 (*)    All other components within normal limits    EKG EKG Interpretation  Date/Time:  Monday August 11 2021 12:08:58 EDT Ventricular Rate:  64 PR Interval:  230 QRS Duration: 95 QT Interval:  453 QTC Calculation: 468 R Axis:   53 Text Interpretation: Sinus rhythm Prolonged PR interval similar to July 2022 Confirmed by Sherwood Gambler [phone]) on 08/11/2021 12:17:11 PM  Radiology DG Chest 2 View  Result Date: 08/11/2021 CLINICAL DATA:  Right chest injury after fall. EXAM: CHEST - 2 VIEW COMPARISON:  April 10, 2014. FINDINGS: Stable cardiomediastinal silhouette. Both lungs are clear. The visualized skeletal structures are unremarkable. IMPRESSION: No active cardiopulmonary disease. Aortic Atherosclerosis (ICD10-I70.0). Electronically Signed   By: Marijo Conception M.D.   On: 08/11/2021 11:15    Procedures Procedures   Medications Ordered in ED Medications - No data to display  ED Course  I have reviewed the triage vital signs and the nursing notes.  Pertinent labs & imaging results that were available during my care of the patient were reviewed by me and considered  in my medical decision making (see chart for details).    MDM Rules/Calculators/A&P                           Chest x-ray obtained given the ecchymosis but there is no clear traumatic injury.  She has a very small bruise on her right thigh that is not symptomatic and she has been walking and highly doubt femur fracture.  Just prior to patient work-up being done, daughter called and wanted INR and labs done.  She is on warfarin and she has been feeling generally weak for the last couple days.  She was started on Macrobid by her PCP for presumed UTI. INR is subtherapeutic, will need to discuss with PCP as they have been adjusting it. Otherwise CBC/CMP benign.  Daughter is now asking for the urine to be checked, which initially I thought she was indicating that there was a definitive UTI being treated.  I offered to check the urinalysis but at this point she wants her to just be discharged home.  I do not think this is unreasonable.  Will discharge home with return precautions.  While she is on warfarin and fell a couple days ago, she has not been acting altered and did not hit her head and so we discussed deferring CT and daughter is okay with this. Final Clinical Impression(s) / ED Diagnoses Final diagnoses:  Traumatic ecchymosis of chest, initial encounter  Subtherapeutic international normalized ratio (INR)    Rx / DC Orders ED Discharge Orders     None  Sherwood Gambler, MD 08/11/21 [phone]

## 2021-08-11 NOTE — ED Notes (Signed)
Patient and caregiver verbalizes understanding of discharge instructions. Spoke with pts daughter over the phone who verbalized understanding of d/c instructions. Opportunity for questioning and answers were provided. Patient discharged from ED with caregiver.

## 2021-08-11 NOTE — ED Triage Notes (Signed)
Pt fell in bathroom on Sat. Bruises to rt chest, left knee ara and skin tears to rt lower FA. Family called PCP today and was told to bring her to the ED.

## 2021-08-21 ENCOUNTER — Encounter (HOSPITAL_BASED_OUTPATIENT_CLINIC_OR_DEPARTMENT_OTHER): Payer: Medicare Other | Attending: Internal Medicine | Admitting: Internal Medicine

## 2021-08-21 ENCOUNTER — Other Ambulatory Visit: Payer: Self-pay

## 2021-08-21 DIAGNOSIS — Z7901 Long term (current) use of anticoagulants: Secondary | ICD-10-CM | POA: Insufficient documentation

## 2021-08-21 DIAGNOSIS — W19XXXA Unspecified fall, initial encounter: Secondary | ICD-10-CM | POA: Insufficient documentation

## 2021-08-21 DIAGNOSIS — S40811A Abrasion of right upper arm, initial encounter: Secondary | ICD-10-CM | POA: Insufficient documentation

## 2021-08-21 DIAGNOSIS — I482 Chronic atrial fibrillation, unspecified: Secondary | ICD-10-CM | POA: Diagnosis not present

## 2021-08-21 DIAGNOSIS — I10 Essential (primary) hypertension: Secondary | ICD-10-CM | POA: Diagnosis not present

## 2021-08-21 DIAGNOSIS — S61511A Laceration without foreign body of right wrist, initial encounter: Secondary | ICD-10-CM | POA: Diagnosis present

## 2021-08-21 DIAGNOSIS — I739 Peripheral vascular disease, unspecified: Secondary | ICD-10-CM | POA: Insufficient documentation

## 2021-08-21 NOTE — Progress Notes (Signed)
[name], [name] ([id number]) Visit Report for 08/21/2021 Abuse/Suicide Risk Screen Details Patient Name: Date of Service: [name], [name] 08/21/2021 1:15 PM Medical Record Number: [id number] Patient Account Number: [id number] Date of Birth/Sex: Treating RN: 11/27/1927 (87 y.o. Female) Lorrin Jackson Primary Care Addyson Traub: Tivis Ringer Other Clinician: Referring Jumanah Hynson: Treating Karder Goodin/Extender: Cheree Ditto in Treatment: 0 Abuse/Suicide Risk Screen Items Answer ABUSE RISK SCREEN: Has anyone close to you tried to hurt or harm you recentlyo No Do you feel uncomfortable with anyone in your familyo No Has anyone forced you do things that you didnt want to doo No Electronic Signature(s) Signed: 08/21/2021 5:16:41 PM By: Lorrin Jackson Entered By: Lorrin Jackson on 08/21/2021 13:48:49 -------------------------------------------------------------------------------- Activities of Daily Living Details Patient Name: Date of Service: [name], [name] 08/21/2021 1:15 PM Medical Record Number: [id number] Patient Account Number: [id number] Date of Birth/Sex: Treating RN: 08-22-1928 (87 y.o. Female) Lorrin Jackson Primary Care Ivette Castronova: Tivis Ringer Other Clinician: Referring Juley Giovanetti: Treating Chasey Dull/Extender: Cheree Ditto in Treatment: 0 Activities of Daily Living Items Answer Activities of Daily Living (Please select one for each item) Drive Automobile Not Able T Medications ake Need Assistance Use T elephone Completely Able Care for Appearance Need Assistance Use T oilet Completely Able Bath / Shower Need Assistance Dress Self Need Assistance Feed Self Completely Able Walk Need Assistance Get In / Out Bed Completely [place] Need Assistance Shop for Self Need Assistance Electronic Signature(s) Signed: 08/21/2021 5:16:41 PM By: Lorrin Jackson Entered By: Lorrin Jackson on 08/21/2021  13:49:37 -------------------------------------------------------------------------------- Education Screening Details Patient Name: Date of Service: [name] 08/21/2021 1:15 PM Medical Record Number: [id number] Patient Account Number: [id number] Date of Birth/Sex: Treating RN: Dec 17, 1927 (87 y.o. Female) Lorrin Jackson Primary Care Jairen Goldfarb: Tivis Ringer Other Clinician: Referring Hoke Baer: Treating Lylah Lantis/Extender: Cheree Ditto in Treatment: 0 Primary Learner Assessed: Patient Learning Preferences/Education Level/Primary Language Learning Preference: Explanation, Demonstration Highest Education Level: College or Above Preferred Language: English Cognitive Barrier Language Barrier: No Translator Needed: No Memory Deficit: No Emotional Barrier: No Cultural/Religious Beliefs Affecting Medical Care: No Physical Barrier Impaired Vision: No Impaired Hearing: No Decreased Hand dexterity: No Knowledge/Comprehension Knowledge Level: Medium Comprehension Level: High Ability to understand written instructions: High Ability to understand verbal instructions: High Motivation Anxiety Level: Calm Cooperation: Cooperative Education Importance: Acknowledges Need Interest in Health Problems: Asks Questions Perception: Coherent Willingness to Engage in Self-Management High Activities: Readiness to Engage in Self-Management High Activities: Electronic Signature(s) Signed: 08/21/2021 5:16:41 PM By: Lorrin Jackson Entered By: Lorrin Jackson on 08/21/2021 13:50:13 -------------------------------------------------------------------------------- Fall Risk Assessment Details Patient Name: Date of Service: [name] 08/21/2021 1:15 PM Medical Record Number: [id number] Patient Account Number: [id number] Date of Birth/Sex: Treating RN: [birth year] (87 y.o. Female) Lorrin Jackson Primary Care Nils Thor: Tivis Ringer Other Clinician: Referring  Sema Stangler: Treating Yannely Kintzel/Extender: Cheree Ditto in Treatment: 0 Fall Risk Assessment Items Have you had 2 or more falls in the last 12 monthso 0 Yes Have you had any fall that resulted in injury in the last 12 monthso 0 No FALLS RISK SCREEN History of falling - immediate or within 3 months 25 Yes Secondary diagnosis (Do you have 2 or more medical diagnoseso) 15 Yes Ambulatory aid None/bed rest/wheelchair/nurse 0 No Crutches/cane/walker 15 Yes Furniture 0 No Intravenous therapy Access/Saline/Heparin Lock 0 No Gait/Transferring Normal/ bed rest/ wheelchair 0 Yes Weak (short steps with or without shuffle, stooped but able to lift head while walking, may seek 0 No  support from furniture) Impaired (short steps with shuffle, may have difficulty arising from chair, head down, impaired 0 No balance) Mental Status Oriented to own ability 0 Yes Electronic Signature(s) Signed: 08/21/2021 5:16:41 PM By: Lorrin Jackson Entered By: Lorrin Jackson on 08/21/2021 13:51:00 -------------------------------------------------------------------------------- Foot Assessment Details Patient Name: Date of Service: [name] 08/21/2021 1:15 PM Medical Record Number: [id number] Patient Account Number: [id number] Date of Birth/Sex: Treating RN: [birth year] (87 y.o. Female) Lorrin Jackson Primary Care Weslyn Holsonback: Tivis Ringer Other Clinician: Referring Latham Kinzler: Treating Doloris Servantes/Extender: Cheree Ditto in Treatment: 0 Foot Assessment Items Site Locations + = Sensation present, - = Sensation absent, C = Callus, U = Ulcer R = Redness, W = Warmth, M = Maceration, PU = Pre-ulcerative lesion F = Fissure, S = Swelling, D = Dryness Assessment Right: Left: Other Deformity: No No Prior Foot Ulcer: No No Prior Amputation: No No Charcot Joint: No No Ambulatory Status: Gait: Notes N/A: arm wound Electronic Signature(s) Signed: 08/21/2021 5:16:41 PM By: Lorrin Jackson Entered  By: Lorrin Jackson on 08/21/2021 13:51:27 -------------------------------------------------------------------------------- Nutrition Risk Screening Details Patient Name: Date of Service: [name], [name] 08/21/2021 1:15 PM Medical Record Number: [id number] Patient Account Number: [id number] Date of Birth/Sex: Treating RN: [birth year] (87 y.o. Female) Lorrin Jackson Primary Care Rozell Theiler: Tivis Ringer Other Clinician: Referring Maleny Candy: Treating Urban Naval/Extender: Cheree Ditto in Treatment: 0 Height (in): Weight (lbs): Body Mass Index (BMI): Nutrition Risk Screening Items Score Screening NUTRITION RISK SCREEN: I have an illness or condition that made me change the kind and/or amount of food I eat 0 No I eat fewer than two meals per day 0 No I eat few fruits and vegetables, or milk products 0 No I have three or more drinks of beer, liquor or wine almost every day 0 No I have tooth or mouth problems that make it hard for me to eat 0 No I don't always have enough money to buy the food I need 0 No I eat alone most of the time 0 No I take three or more different prescribed or over-the-counter drugs a day 1 Yes Without wanting to, I have lost or gained 10 pounds in the last six months 0 No I am not always physically able to shop, cook and/or feed myself 0 No Nutrition Protocols Good Risk Protocol 0 No interventions needed Moderate Risk Protocol High Risk Proctocol Risk Level: Good Risk Score: 1 Electronic Signature(s) Signed: 08/21/2021 5:16:41 PM By: Lorrin Jackson Entered By: Lorrin Jackson on 08/21/2021 13:51:15

## 2021-08-22 NOTE — Progress Notes (Addendum)
[name] ([id number]) Visit Report for 08/21/2021 Debridement Details Patient Name: Date of Service: [name], [name] 08/21/2021 1:15 PM Medical Record Number: [id number] Patient Account Number: [id number] Date of Birth/Sex: Treating RN: [name] (87 y.o. [name], [name] Primary Care Provider: Tivis Lutz Other Clinician: Referring Provider: Treating Provider/Extender: [name] Weeks in Treatment: 0 Debridement Performed for Assessment: Wound #6 Right Forearm Performed By: Physician [name] Debridement Type: Debridement Level of Consciousness (Pre-procedure): Awake and Alert Pre-procedure Verification/Time Out Yes - 14:15 Taken: Start Time: 14:16 Pain Control: Lidocaine 4% Topical Solution T Area Debrided (L x W): otal 3 (cm) x 2 (cm) = 6 (cm) Tissue and other material debrided: Non-Viable, Eschar, Skin: Dermis Level: Skin/Dermis Debridement Description: Selective/Open Wound Instrument: Curette Bleeding: Minimum Hemostasis Achieved: Pressure Response to Treatment: Procedure was tolerated well Level of Consciousness (Post- Awake and Alert procedure): Post Debridement Measurements of Total Wound Length: (cm) 4.5 Width: (cm) 2.8 Depth: (cm) 0.1 Volume: (cm) 0.99 Character of Wound/Ulcer Post Debridement: Stable Post Procedure Diagnosis Same as Pre-procedure Electronic Signature(s) Signed: 08/21/2021 5:10:51 PM By: Kimberly Ham Lutz Signed: 08/22/2021 12:27:53 PM By: Kimberly Hammock RN Entered By: [name] on 08/21/2021 14:24:58 -------------------------------------------------------------------------------- HPI Details Patient Name: Date of Service: [name] 08/21/2021 1:15 PM Medical Record Number: [id number] Patient Account Number: [id number] Date of Birth/Sex: Treating RN: [name] (87 y.o. [name] Primary Care Provider: Tivis Lutz Other Clinician: Referring  Provider: Treating Provider/Extender: [name] Weeks in Treatment: 0 History of Present Illness Location: left lower extremity on her shin Quality: Patient reports experiencing a dull pain to affected area(s). Severity: Patient states wound(s) are getting better. Duration: Patient has had the wound for < 3 weeks prior to presenting for treatment Timing: Pain in wound is constant (hurts all the time) Context: The wound occurred when the patient had a fall and had a lacerated wound to her left shin which was treated in the ER Modifying Factors: Patient wound(s)/ulcer(s) are improving due WE:RXVQM care ssociated Signs and Symptoms: Patient reports having increase swelling. A HPI Description: [name] patient who is known to have chronic atrial fibrillation and on Coumadin had a fall on November 13 and had a large laceration on her left lower extremity in the pretibial area and was seen in the ER by Dr. Bridgett Lutz who needed to control the venous bleeding with sutures. The patient was then followed up by Dr. Kellie Lutz at the vascular office to see if she needed any workup for varicose veins. He did note good arterial flow and a few varicosities in the medial thigh and medial calf with some hyperpigmentation of the lower left leg. However a bedside ultrasound revealed that there was no significant left greater saphenous vein or prominent veins in the calf area which needed surgery. He noted there was no significant arterial insufficiency or venous disease and recommended no further intervention or evaluation. He referred the patient was for wound care. Past medical history significant for A. fib on Coumadin, hyperlipidemia hypertension peripheral vascular disease and history of venous surgery 50 years ago with a left leg vein stripping. READMISSION 08/21/2021 This is now a 55 year Lutz woman who has had several stays in this clinic most recently in 2017 at which time she had a  fall and a laceration on her left anterior tibia. She was cared for by Dr. Con Lutz at this time. Her current problem began sometime at the end of October she had a fall  and was seen in the ER with an ecchymosis of her chest. She is on Coumadin her labs were normal. However apparently in this same fall she also had a skin tear on her right wrist. They have been using dry dressing and came in with this Steri-Stripped. Past medical history includes chronic atrial fibrillation on Coumadin, some degree of cognitive impairment, peripheral vascular disease. She lives in her own home with aides. Her daughter is a Marine scientist at [name]) Signed: 08/21/2021 5:10:51 PM By: Kimberly Ham Lutz Entered By: [name] on 08/21/2021 14:26:39 -------------------------------------------------------------------------------- Physical Exam Details Patient Name: Date of Service: [name] 08/21/2021 1:15 PM Medical Record Number: [id number] Patient Account Number: [id number] Date of Birth/Sex: Treating RN: [birth year]-Lutz (87 y.o. [name] Primary Care Provider: Tivis Lutz Other Clinician: Referring Provider: Treating Provider/Extender: [name], [name] Weeks in Treatment: 0 Constitutional Sitting or standing Blood Pressure is within target range for patient.. Pulse regular and within target range for patient.Marland Kitchen Respirations regular, non-labored and within target range.. Temperature is normal and within the target range for the patient.Marland Kitchen Appears in no distress. Notes Exam; the patient has abrasion injuries on the right dorsal wrist and lower arm. Both of these in the same condition some degree of eschar and debris on the surface removed with a #3 curette these are small wounds. They do not look ominous and they do not look infected no cultures were done. Radial pulses were palpable. Electronic Signature(s) Signed: 08/21/2021 5:10:51 PM By: Kimberly Ham Lutz Entered By: [name] on 08/21/2021 14:27:43 -------------------------------------------------------------------------------- Physician Orders Details Patient Name: Date of Service: [name] 08/21/2021 1:15 PM Medical Record Number: [id number] Patient Account Number: [id number] Date of Birth/Sex: Treating RN: L[birth year] (87 y.o. Sue Lush Primary Care Provider: Tivis Lutz Other Clinician: Referring Provider: Treating Provider/Extender: [name] Weeks in Treatment: 0 Verbal / Phone Orders: No Diagnosis Coding Follow-up Appointments ppointment in 1 week. - with Dr. Dellia Nims Return A Other: - [facility]=Supplies Bathing/ Shower/ Hygiene Kimberly shower and wash wound with soap and water. - when changing dressing Additional Orders / Instructions Follow Nutritious Diet Wound Treatment Wound #6 - Forearm Wound Laterality: Right Cleanser: Soap and Water 1 x Per Day/30 Days Discharge Instructions: Kimberly shower and wash wound with dial antibacterial soap and water prior to dressing change. Prim Dressing: Xeroform Occlusive Gauze Dressing, 4x4 in 1 x Per Day/30 Days ary Discharge Instructions: Apply to wound bed as instructed Secondary Dressing: Woven Gauze Sponge, Non-Sterile 4x4 in 1 x Per Day/30 Days Discharge Instructions: Apply over primary dressing as directed. Secured With: Child psychotherapist, Sterile 2x75 (in/in) 1 x Per Day/30 Days Discharge Instructions: Secure with stretch gauze as directed. Secured With: Transpore Surgical Tape, 2x10 (in/yd) 1 x Per Day/30 Days Discharge Instructions: Secure dressing with tape as directed. Electronic Signature(s) Signed: 08/21/2021 5:10:51 PM By: Kimberly Ham Lutz Signed: 08/21/2021 5:16:41 PM By: Lorrin Jackson Entered By: Lorrin Jackson on 08/21/2021 14:22:19 -------------------------------------------------------------------------------- Problem List Details Patient  Name: Date of Service: [name] 08/21/2021 1:15 PM Medical Record Number: [id number] Patient Account Number: [id number] Date of Birth/Sex: Treating RN: [birth year] Lutz (87 y.o. [name] Primary Care Provider: Tivis Lutz Other Clinician: Referring Provider: Treating Provider/Extender: [name] Weeks in Treatment: 0 Active Problems ICD-10 Encounter Code Description Active Date MDM Diagnosis S40.811D Abrasion of right upper arm, subsequent encounter 08/21/2021 No Yes S61.501S Unspecified open wound of right wrist, sequela  08/21/2021 No Yes Inactive Problems Resolved Problems Electronic Signature(s) Signed: 08/21/2021 5:10:51 PM By: Kimberly Ham Lutz Entered By: [name] on 08/21/2021 14:24:35 -------------------------------------------------------------------------------- Progress Note Details Patient Name: Date of Service: [name] 08/21/2021 1:15 PM Medical Record Number: [id number] Patient Account Number: [id number] Date of Birth/Sex: Treating RN: [birth year], Lutz (87 y.o. [name] Primary Care Provider: Tivis Lutz Other Clinician: Referring Provider: Treating Provider/Extender: [name] Weeks in Treatment: 0 Subjective History of Present Illness (HPI) The following HPI elements were documented for the patient's wound: Location: left lower extremity on her shin Quality: Patient reports experiencing a dull pain to affected area(s). Severity: Patient states wound(s) are getting better. Duration: Patient has had the wound for < 3 weeks prior to presenting for treatment Timing: Pain in wound is constant (hurts all the time) Context: The wound occurred when the patient had a fall and had a lacerated wound to her left shin which was treated in the ER Modifying Factors: Patient wound(s)/ulcer(s) are improving due LP:FXTKW care Associated Signs and Symptoms: Patient reports having  increase swelling. 58 year Lutz patient who is known to have chronic atrial fibrillation and on Coumadin had a fall on November 13 and had a large laceration on her left lower extremity in the pretibial area and was seen in the ER by Dr. Bridgett Lutz who needed to control the venous bleeding with sutures. The patient was then followed up by Dr. Kellie Lutz at the vascular office to see if she needed any workup for varicose veins. He did note good arterial flow and a few varicosities in the medial thigh and medial calf with some hyperpigmentation of the lower left leg. However a bedside ultrasound revealed that there was no significant left greater saphenous vein or prominent veins in the calf area which needed surgery. He noted there was no significant arterial insufficiency or venous disease and recommended no further intervention or evaluation. He referred the patient was for wound care. Past medical history significant for A. fib on Coumadin, hyperlipidemia hypertension peripheral vascular disease and history of venous surgery 50 years ago with a left leg vein stripping. READMISSION 08/21/2021 This is now a 78 year Lutz woman who has had several stays in this clinic most recently in 2017 at which time she had a fall and a laceration on her left anterior tibia. She was cared for by Dr. Con Lutz at this time. Her current problem began sometime at the end of October she had a fall and was seen in the ER with an ecchymosis of her chest. She is on Coumadin her labs were normal. However apparently in this same fall she also had a skin tear on her right wrist. They have been using dry dressing and came in with this Steri-Stripped. Past medical history includes chronic atrial fibrillation on Coumadin, some degree of cognitive impairment, peripheral vascular disease. She lives in her own home with aides. Her daughter is a Marine scientist at [employer] Patient History Information obtained from Patient, Caregiver. Allergies Iodinated  Contrast- Oral and IV Dye (Reaction: Anaphylaxis), dronedarone (Reaction: CHF), amiodarone (Reaction: Pulmonary Edema), flecainide (Reaction: Parkinson like reaction), latex (Reaction: Itching, Swelling), Multaq (Reaction: Swelling), Sulfa (Sulfonamide Antibiotics) Family History Heart Disease - Siblings, Hypertension - Siblings, Lung Disease - Father,Child, Stroke - Mother, Thyroid Problems - Child, Tuberculosis - Father,Maternal Grandparents, No family history of Cancer, Diabetes, Hereditary Spherocytosis, Kidney Disease, Seizures. Social History Former smoker, Marital Status - Widowed, Alcohol Use - Moderate, Drug Use - No History, Caffeine Use - Rarely. Medical  History Ear/Nose/Mouth/Throat Patient has history of Chronic sinus problems/congestion Hematologic/Lymphatic Patient has history of Anemia Respiratory Patient has history of Aspiration Cardiovascular Patient has history of Arrhythmia, Hypertension, Peripheral Venous Disease Musculoskeletal Patient has history of Osteomyelitis Denies history of Rheumatoid Arthritis Medical A Surgical History Notes nd Ear/Nose/Mouth/Throat Allergic rhinitis Hematologic/Lymphatic Hyperlipidemia Acute blood loss anemia Respiratory Aspiration Pneumonia Cardiovascular A-Fib Varicose veins with complications Musculoskeletal Hip Fx Osteoporosis Psychiatric Depression Review of Systems (ROS) Ear/Nose/Mouth/Throat Complains or has symptoms of Chronic sinus problems or rhinitis. Gastrointestinal Denies complaints or symptoms of Frequent diarrhea, Nausea, Vomiting. Endocrine Denies complaints or symptoms of Heat/cold intolerance. Genitourinary Denies complaints or symptoms of Frequent urination. Neurologic Denies complaints or symptoms of Numbness/parasthesias. Psychiatric Denies complaints or symptoms of Claustrophobia, Suicidal. Objective Constitutional Sitting or standing Blood Pressure is within target range for patient.. Pulse  regular and within target range for patient.Marland Kitchen Respirations regular, non-labored and within target range.. Temperature is normal and within the target range for the patient.Marland Kitchen Appears in no distress. Vitals Time Taken: 1:45 PM, Temperature: 98.4 F, Pulse: 70 bpm, Respiratory Rate: 16 breaths/min, Blood Pressure: 141/84 mmHg. General Notes: Exam; the patient has abrasion injuries on the right dorsal wrist and lower arm. Both of these in the same condition some degree of eschar and debris on the surface removed with a #3 curette these are small wounds. They do not look ominous and they do not look infected no cultures were done. Radial pulses were palpable. Integumentary (Hair, Skin) Wound #6 status is Open. Original cause of wound was Trauma. The date acquired was: 08/11/2021. The wound is located on the Right Forearm. The wound measures 4.5cm length x 2.8cm width x 0.1cm depth; 9.896cm^2 area and 0.99cm^3 volume. There is Fat Layer (Subcutaneous Tissue) exposed. There is no tunneling or undermining noted. There is a medium amount of purulent drainage noted. The wound margin is distinct with the outline attached to the wound base. There is large (67-100%) red granulation within the wound bed. There is a small (1-33%) amount of necrotic tissue within the wound bed including Eschar and Adherent Slough. Assessment Active Problems ICD-10 Abrasion of right upper arm, subsequent encounter Unspecified open wound of right wrist, sequela Procedures Wound #6 Pre-procedure diagnosis of Wound #6 is a Trauma, Other located on the Right Forearm . There was a Selective/Open Wound Skin/Dermis Debridement with a total area of 6 sq cm performed by [name]. With the following instrument(s): Curette to remove Non-Viable tissue/material. Material removed includes Eschar and Skin: Dermis and after achieving pain control using Lidocaine 4% T opical Solution. No specimens were taken. A time out was  conducted at 14:15, prior to the start of the procedure. A Minimum amount of bleeding was controlled with Pressure. The procedure was tolerated well. Post Debridement Measurements: 4.5cm length x 2.8cm width x 0.1cm depth; 0.99cm^3 volume. Character of Wound/Ulcer Post Debridement is stable. Post procedure Diagnosis Wound #6: Same as Pre-Procedure Plan Follow-up Appointments: Return Appointment in 1 week. - with Dr. Dellia Nims Other: - [facility]=Supplies Bathing/ Shower/ Hygiene: Kimberly shower and wash wound with soap and water. - when changing dressing Additional Orders / Instructions: Follow Nutritious Diet WOUND #6: - Forearm Wound Laterality: Right Cleanser: Soap and Water 1 x Per Day/30 Days Discharge Instructions: Kimberly shower and wash wound with dial antibacterial soap and water prior to dressing change. Prim Dressing: Xeroform Occlusive Gauze Dressing, 4x4 in 1 x Per Day/30 Days ary Discharge Instructions: Apply to wound bed as instructed Secondary Dressing: Woven Gauze  Sponge, Non-Sterile 4x4 in 1 x Per Day/30 Days Discharge Instructions: Apply over primary dressing as directed. Secured With: Child psychotherapist, Sterile 2x75 (in/in) 1 x Per Day/30 Days Discharge Instructions: Secure with stretch gauze as directed. Secured With: Transpore Surgical T ape, 2x10 (in/yd) 1 x Per Day/30 Days Discharge Instructions: Secure dressing with tape as directed. #1 I applied Xeroform and gauze to the wound. This can be changed daily after her shower. Soap and water is fine. 2. No evidence of current infection. No antibiotics were necessary 3. The patient came in with Steri-Strips in place however there was nothing to really hold together here. This will simply need to epithelialize. 4. I will see her again in a week. I suspect this will heal fairly quickly. Collagen would be an alternative Electronic Signature(s) Signed: 08/21/2021 5:10:51 PM By: Kimberly Ham Lutz Entered By: [name] on 08/21/2021 14:28:59 -------------------------------------------------------------------------------- HxROS Details Patient Name: Date of Service: [name] 08/21/2021 1:15 PM Medical Record Number: [id number] Patient Account Number: [id number] Date of Birth/Sex: Treating RN: [birth year]-Lutz (87 y.o. Sue Lush Primary Care Provider: Tivis Lutz Other Clinician: Referring Provider: Treating Provider/Extender: [name] Weeks in Treatment: 0 Information Obtained From Patient Caregiver Ear/Nose/Mouth/Throat Complaints and Symptoms: Positive for: Chronic sinus problems or rhinitis Medical History: Positive for: Chronic sinus problems/congestion Past Medical History Notes: Allergic rhinitis Gastrointestinal Complaints and Symptoms: Negative for: Frequent diarrhea; Nausea; Vomiting Endocrine Complaints and Symptoms: Negative for: Heat/cold intolerance Genitourinary Complaints and Symptoms: Negative for: Frequent urination Neurologic Complaints and Symptoms: Negative for: Numbness/parasthesias Psychiatric Complaints and Symptoms: Negative for: Claustrophobia; Suicidal Medical History: Past Medical History Notes: Depression Hematologic/Lymphatic Medical History: Positive for: Anemia Past Medical History Notes: Hyperlipidemia Acute blood loss anemia Respiratory Medical History: Positive for: Aspiration Past Medical History Notes: Aspiration Pneumonia Cardiovascular Medical History: Positive for: Arrhythmia; Hypertension; Peripheral Venous Disease Past Medical History Notes: A-Fib Varicose veins with complications Immunological Musculoskeletal Medical History: Positive for: Osteomyelitis Negative for: Rheumatoid Arthritis Past Medical History Notes: Hip Fx Osteoporosis Oncologic HBO Extended History Items Ear/Nose/Mouth/Throat: Chronic sinus problems/congestion Immunizations Pneumococcal Vaccine: Received  Pneumococcal Vaccination: No Implantable Devices None Family and Social History Cancer: No; Diabetes: No; Heart Disease: Yes - Siblings; Hereditary Spherocytosis: No; Hypertension: Yes - Siblings; Kidney Disease: No; Lung Disease: Yes - Father,Child; Seizures: No; Stroke: Yes - Mother; Thyroid Problems: Yes - Child; Tuberculosis: Yes - Father,Maternal Grandparents; Former smoker; Marital Status - Widowed; Alcohol Use: Moderate; Drug Use: No History; Caffeine Use: Rarely; Financial Concerns: No; Food, Clothing or Shelter Needs: No; Support System Lacking: No; Transportation Concerns: No Engineer, maintenance) Signed: 08/21/2021 5:10:51 PM By: Kimberly Ham Lutz Signed: 08/21/2021 5:16:41 PM By: Lorrin Jackson Entered By: Lorrin Jackson on 08/21/2021 13:48:37 -------------------------------------------------------------------------------- SuperBill Details Patient Name: Date of Service: [name] 08/21/2021 Medical Record Number: [id number] Patient Account Number: [id number] Date of Birth/Sex: Treating RN: [birth year]Lutz (87 y.o. [name] Primary Care Provider: Tivis Lutz Other Clinician: Referring Provider: Treating Provider/Extender: [name] Weeks in Treatment: 0 Diagnosis Coding ICD-10 Codes Code Description [phone] Abrasion of right upper arm, subsequent encounter S61.501S Unspecified open wound of right wrist, sequela Facility Procedures CPT4 Code: [id number] Description: 99213 - WOUND CARE VISIT-LEV 3 EST PT Modifier: Quantity: 1 CPT4 Code: [id number] Description: 15176 - DEBRIDE WOUND 1ST 20 SQ CM OR < ICD-10 Diagnosis Description H60.737T Unspecified open wound of right wrist, sequela S40.811D Abrasion of right upper arm, subsequent encounter Modifier: Quantity: 1 Physician Procedures :  CPT4 Code Description Modifier [id number] - WC PHYS LEVEL 2 - NEW PT 25 ICD-10 Diagnosis Description S40.811D Abrasion of right upper arm,  subsequent encounter S61.501S Unspecified open wound of right wrist, sequela Quantity: 1 : [id number] - WC PHYS DEBR WO ANESTH 20 SQ CM ICD-10 Diagnosis Description H38.871L Unspecified open wound of right wrist, sequela S40.811D Abrasion of right upper arm, subsequent encounter Quantity: 1 Electronic Signature(s) Signed: 09/10/2021 4:53:46 PM By: Kimberly Ham Lutz Signed: 10/30/2021 12:38:30 PM By: Kimberly Hammock RN Previous Signature: 08/21/2021 5:10:51 PM Version By: Kimberly Ham Lutz Entered By: [name] on 09/09/2021 17:19:06

## 2021-08-22 NOTE — Progress Notes (Addendum)
Kimberly, Lutz ([id number]) Visit Report for 08/21/2021 Allergy List Details Patient Name: Date of Service: Kimberly, Lutz 08/21/2021 1:15 PM Medical Record Number: [id number] Patient Account Number: [id number] Date of Birth/Sex: Treating RN: [birth year] (87 y.o. Sue Lush Primary Care Chaddrick Brue: Tivis Ringer Other Clinician: Referring Markos Theil: Treating Kayleann Mccaffery/Extender: Langley Gauss, Pilar Plate Weeks in Treatment: 0 Allergies Active Allergies Iodinated Contrast- Oral and IV Dye Reaction: Anaphylaxis dronedarone Reaction: CHF amiodarone Reaction: Pulmonary Edema flecainide Reaction: Parkinson like reaction latex Reaction: Itching, Swelling Multaq Reaction: Swelling Sulfa (Sulfonamide Antibiotics) Allergy Notes Electronic Signature(s) Signed: 08/21/2021 5:16:41 PM By: Lorrin Jackson Entered By: Lorrin Jackson on 08/21/2021 13:38:24 -------------------------------------------------------------------------------- Arrival Information Details Patient Name: Date of Service: [name] 08/21/2021 1:15 PM Medical Record Number: [id number] Patient Account Number: [id number] Date of Birth/Sex: Treating RN: 10/17/1927 (87 y.o. Sue Lush Primary Care Warda Mcqueary: Tivis Ringer Other Clinician: Referring Dason Mosley: Treating Vastie Douty/Extender: April Holding in Treatment: 0 Visit Information Patient Arrived: Kasandra Knudsen Arrival Time: 13:43 Accompanied By: caregiver Transfer Assistance: None Patient Identification Verified: Yes Secondary Verification Process Completed: Yes Patient Requires Transmission-Based Precautions: No Patient Has Alerts: Yes Patient Alerts: Patient on Blood Thinner History Since Last Visit Added or deleted any medications: No Any new allergies or adverse reactions: No Had a fall or experienced change in activities of daily living that may affect risk of falls: No Signs or symptoms of abuse/neglect  since last visito No Hospitalized since last visit: No Implantable device outside of the clinic excluding Implantable device outside of the clinic excluding cellular tissue based products placed in the center since last visit: No No Has Dressing in Place as Prescribed: Yes Pain Present Now: No Electronic Signature(s) Signed: 08/21/2021 5:16:41 PM By: Lorrin Jackson Entered By: Lorrin Jackson on 08/21/2021 13:45:17 -------------------------------------------------------------------------------- Clinic Level of Care Assessment Details Patient Name: Date of Service: Kimberly, Lutz 08/21/2021 1:15 PM Medical Record Number: [id number] Patient Account Number: [id number] Date of Birth/Sex: Treating RN: Jun 21, 1928 (87 y.o. Sue Lush Primary Care Arzella Rehmann: Tivis Ringer Other Clinician: Referring Sumi Lye: Treating Dejanae Helser/Extender: Anne Fu Weeks in Treatment: 0 Clinic Level of Care Assessment Items TOOL 4 Quantity Score X- 1 0 Use when only an EandM is performed on FOLLOW-UP visit ASSESSMENTS - Nursing Assessment / Reassessment X- 1 10 Reassessment of Co-morbidities (includes updates in patient status) X- 1 5 Reassessment of Adherence to Treatment Plan ASSESSMENTS - Wound and Skin A ssessment / Reassessment X - Simple Wound Assessment / Reassessment - one wound 1 5 []  - 0 Complex Wound Assessment / Reassessment - multiple wounds []  - 0 Dermatologic / Skin Assessment (not related to wound area) ASSESSMENTS - Focused Assessment []  - 0 Circumferential Edema Measurements - multi extremities []  - 0 Nutritional Assessment / Counseling / Intervention []  - 0 Lower Extremity Assessment (monofilament, tuning fork, pulses) []  - 0 Peripheral Arterial Disease Assessment (using hand held doppler) ASSESSMENTS - Ostomy and/or Continence Assessment and Care []  - 0 Incontinence Assessment and Management []  - 0 Ostomy Care Assessment and Management  (repouching, etc.) PROCESS - Coordination of Care X - Simple Patient / Family Education for ongoing care 1 15 []  - 0 Complex (extensive) Patient / Family Education for ongoing care X- 1 10 Staff obtains Programmer, systems, Records, T Results / Process Orders est []  - 0 Staff telephones HHA, Nursing Homes / Clarify orders / etc []  - 0 Routine Transfer to another Facility (non-emergent condition) []  - 0 Routine Hospital  Admission (non-emergent condition) []  - 0 New Admissions / Biomedical engineer / Ordering NPWT Apligraf, etc. , []  - 0 Emergency Hospital Admission (emergent condition) X- 1 10 Simple Discharge Coordination []  - 0 Complex (extensive) Discharge Coordination PROCESS - Special Needs []  - 0 Pediatric / Minor Patient Management []  - 0 Isolation Patient Management []  - 0 Hearing / Language / Visual special needs []  - 0 Assessment of Community assistance (transportation, D/C planning, etc.) []  - 0 Additional assistance / Altered mentation []  - 0 Support Surface(s) Assessment (bed, cushion, seat, etc.) INTERVENTIONS - Wound Cleansing / Measurement X - Simple Wound Cleansing - one wound 1 5 []  - 0 Complex Wound Cleansing - multiple wounds X- 1 5 Wound Imaging (photographs - any number of wounds) []  - 0 Wound Tracing (instead of photographs) X- 1 5 Simple Wound Measurement - one wound []  - 0 Complex Wound Measurement - multiple wounds INTERVENTIONS - Wound Dressings X - Small Wound Dressing one or multiple wounds 1 10 []  - 0 Medium Wound Dressing one or multiple wounds []  - 0 Large Wound Dressing one or multiple wounds []  - 0 Application of Medications - topical []  - 0 Application of Medications - injection INTERVENTIONS - Miscellaneous []  - 0 External ear exam []  - 0 Specimen Collection (cultures, biopsies, blood, body fluids, etc.) []  - 0 Specimen(s) / Culture(s) sent or taken to Lab for analysis []  - 0 Patient Transfer (multiple staff / Civil Service fast streamer /  Similar devices) []  - 0 Simple Staple / Suture removal (25 or less) []  - 0 Complex Staple / Suture removal (26 or more) []  - 0 Hypo / Hyperglycemic Management (close monitor of Blood Glucose) []  - 0 Ankle / Brachial Index (ABI) - do not check if billed separately X- 1 5 Vital Signs Has the patient been seen at the hospital within the last three years: Yes Total Score: 85 Level Of Care: New/Established - Level 3 Electronic Signature(s) Signed: 10/30/2021 12:38:30 PM By: Rhae Hammock RN Previous Signature: 08/21/2021 5:16:41 PM Version By: Lorrin Jackson Entered By: Rhae Hammock on 09/09/2021 17:18:55 -------------------------------------------------------------------------------- Encounter Discharge Information Details Patient Name: Date of Service: [name] 08/21/2021 1:15 PM Medical Record Number: [id number] Patient Account Number: [id number] Date of Birth/Sex: Treating RN: 1928/06/17 (87 y.o. Sue Lush Primary Care Taya Ashbaugh: Tivis Ringer Other Clinician: Referring Despina Boan: Treating Humphrey Guerreiro/Extender: Anne Fu Weeks in Treatment: 0 Encounter Discharge Information Items Post Procedure Vitals Discharge Condition: Stable Temperature (F): 98.4 Ambulatory Status: Cane Pulse (bpm): 70 Discharge Destination: Home Respiratory Rate (breaths/min): 16 Transportation: Private Auto Blood Pressure (mmHg): 141/84 Accompanied By: caregiver Schedule Follow-up Appointment: Yes Clinical Summary of Care: Provided on 08/21/2021 Form Type Recipient Paper Patient Patient Electronic Signature(s) Signed: 08/21/2021 5:16:41 PM By: Lorrin Jackson Entered By: Lorrin Jackson on 08/21/2021 14:30:29 -------------------------------------------------------------------------------- Lower Extremity Assessment Details Patient Name: Date of Service: [name] 08/21/2021 1:15 PM Medical Record Number: [id number] Patient Account Number:  [id number] Date of Birth/Sex: Treating RN: 1928/07/30 (87 y.o. Sue Lush Primary Care Samaya Boardley: Tivis Ringer Other Clinician: Referring Shruti Arrey: Treating Deshonna Trnka/Extender: Langley Gauss, Pilar Plate Weeks in Treatment: 0 Notes N/A: arm wound Electronic Signature(s) Signed: 08/21/2021 5:16:41 PM By: Lorrin Jackson Entered By: Lorrin Jackson on 08/21/2021 13:51:45 -------------------------------------------------------------------------------- Multi Wound Chart Details Patient Name: Date of Service: [name] 08/21/2021 1:15 PM Medical Record Number: [id number] Patient Account Number: [id number] Date of Birth/Sex: Treating RN: 1928-06-25 (87 y.o. [name] Primary Care Dantrell Schertzer: Dagmar Hait,  Ravisankar R Other Clinician: Referring Primus Gritton: Treating Betrice Wanat/Extender: Langley Gauss, Ravisankar R Weeks in Treatment: 0 Vital Signs Height(in): Pulse(bpm): 32 Weight(lbs): Blood Pressure(mmHg): 141/84 Body Mass Index(BMI): Temperature(F): 98.4 Respiratory Rate(breaths/min): 16 Photos: [6:Right Forearm] [N/A:N/A N/A] Wound Location: [6:Trauma] [N/A:N/A] Wounding Event: [6:Trauma, Other] [N/A:N/A] Primary Etiology: [6:Chronic sinus problems/congestion, N/A] Comorbid History: [6:Anemia, Aspiration, Arrhythmia, Hypertension, Peripheral Venous Disease, Osteomyelitis 08/11/2021] [N/A:N/A] Date Acquired: [6:0] [N/A:N/A] Weeks of Treatment: [6:Open] [N/A:N/A] Wound Status: [6:4.5x2.8x0.1] [N/A:N/A] Measurements L x W x D (cm) [6:9.896] [N/A:N/A] A (cm) : rea [6:0.99] [N/A:N/A] Volume (cm) : [6:0.00%] [N/A:N/A] % Reduction in A [6:rea: 0.00%] [N/A:N/A] % Reduction in Volume: [6:Full Thickness Without Exposed] [N/A:N/A] Classification: [6:Support Structures Medium] [N/A:N/A] Exudate A mount: [6:Serosanguineous] [N/A:N/A] Exudate Type: [6:red, brown] [N/A:N/A] Exudate Color: [6:Distinct, outline attached] [N/A:N/A] Wound Margin: [6:Large  (67-100%)] [N/A:N/A] Granulation A mount: [6:Red] [N/A:N/A] Granulation Quality: [6:Small (1-33%)] [N/A:N/A] Necrotic A mount: [6:Eschar, Adherent Slough] [N/A:N/A] Necrotic Tissue: [6:Fat Layer (Subcutaneous Tissue): Yes N/A] Exposed Structures: [6:Fascia: No Tendon: No Muscle: No Joint: No Bone: No None] [N/A:N/A] Epithelialization: [6:Debridement - Selective/Open Wound N/A] Debridement: Pre-procedure Verification/Time Out 14:15 [N/A:N/A] Taken: [6:Lidocaine 4% Topical Solution] [N/A:N/A] Pain Control: [6:Necrotic/Eschar] [N/A:N/A] Tissue Debrided: [6:Skin/Dermis] [N/A:N/A] Level: [6:6] [N/A:N/A] Debridement A (sq cm): [6:rea Curette] [N/A:N/A] Instrument: [6:Minimum] [N/A:N/A] Bleeding: [6:Pressure] [N/A:N/A] Hemostasis A chieved: [6:Procedure was tolerated well] [N/A:N/A] Debridement Treatment Response: [6:4.5x2.8x0.1] [N/A:N/A] Post Debridement Measurements L x W x D (cm) [6:0.99] [N/A:N/A] Post Debridement Volume: (cm) [6:Debridement] [N/A:N/A] Treatment Notes Wound #6 (Forearm) Wound Laterality: Right Cleanser Soap and Water Discharge Instruction: May shower and wash wound with dial antibacterial soap and water prior to dressing change. Peri-Wound Care Topical Primary Dressing Xeroform Occlusive Gauze Dressing, 4x4 in Discharge Instruction: Apply to wound bed as instructed Secondary Dressing Woven Gauze Sponge, Non-Sterile 4x4 in Discharge Instruction: Apply over primary dressing as directed. Secured With Conforming Stretch Gauze Bandage, Sterile 2x75 (in/in) Discharge Instruction: Secure with stretch gauze as directed. Transpore Surgical Tape, 2x10 (in/yd) Discharge Instruction: Secure dressing with tape as directed. Compression Wrap Compression Stockings Add-Ons Electronic Signature(s) Signed: 08/21/2021 4:36:12 PM By: Lorrin Jackson Signed: 08/22/2021 12:27:53 PM By: Rhae Hammock RN Entered By: Lorrin Jackson on 08/21/2021  16:36:12 -------------------------------------------------------------------------------- Multi-Disciplinary Care Plan Details Patient Name: Date of Service: [name] 08/21/2021 1:15 PM Medical Record Number: [id number] Patient Account Number: [id number] Date of Birth/Sex: Treating RN: June 11, 1928 (87 y.o. Sue Lush Primary Care Kaylor Maiers: Tivis Ringer Other Clinician: Referring Anterrio Mccleery: Treating Rorey Hodges/Extender: Anne Fu Weeks in Treatment: 0 Active Inactive Abuse / Safety / Falls / Self Care Management Nursing Diagnoses: History of Falls Goals: Patient will not experience any injury related to falls Date Initiated: 08/21/2021 Target Resolution Date: 09/18/2021 Goal Status: Active Interventions: Assess fall risk on admission and as needed Assess impairment of mobility on admission and as needed per policy Notes: Wound/Skin Impairment Nursing Diagnoses: Impaired tissue integrity Goals: Patient/caregiver will verbalize understanding of skin care regimen Date Initiated: 08/21/2021 Target Resolution Date: 09/18/2021 Goal Status: Active Ulcer/skin breakdown will have a volume reduction of 30% by week 4 Date Initiated: 08/21/2021 Target Resolution Date: 09/18/2021 Goal Status: Active Interventions: Assess patient/caregiver ability to obtain necessary supplies Assess patient/caregiver ability to perform ulcer/skin care regimen upon admission and as needed Assess ulceration(s) every visit Provide education on ulcer and skin care Treatment Activities: Topical wound management initiated : 08/21/2021 Notes: Electronic Signature(s) Signed: 08/21/2021 5:16:41 PM By: Lorrin Jackson Entered By: Lorrin Jackson on 08/21/2021 14:02:49 -------------------------------------------------------------------------------- Pain Assessment Details Patient  Name: Date of Service: JOSILYN, SHIPPEE 08/21/2021 1:15 PM Medical Record Number:  [id number] Patient Account Number: [id number] Date of Birth/Sex: Treating RN: 1928/09/09 (87 y.o. Sue Lush Primary Care Mercer Stallworth: Tivis Ringer Other Clinician: Referring Rourke Mcquitty: Treating Dorin Stooksbury/Extender: Anne Fu Weeks in Treatment: 0 Active Problems Location of Pain Severity and Description of Pain Patient Has Paino No Site Locations Pain Management and Medication Current Pain Management: Electronic Signature(s) Signed: 08/21/2021 5:16:41 PM By: Lorrin Jackson Entered By: Lorrin Jackson on 08/21/2021 13:52:02 -------------------------------------------------------------------------------- Patient/Caregiver Education Details Patient Name: Date of Service: [name] 11/10/2022andnbsp1:15 PM Medical Record Number: [id number] Patient Account Number: [id number] Date of Birth/Gender: Treating RN: Jan 16, 1928 (87 y.o. Sue Lush Primary Care Physician: Tivis Ringer Other Clinician: Referring Physician: Treating Physician/Extender: April Holding in Treatment: 0 Education Assessment Education Provided To: Patient and Caregiver Education Topics Provided Safety: Methods: Explain/Verbal, Printed Responses: State content correctly Wound/Skin Impairment: Methods: Demonstration, Explain/Verbal, Printed Responses: State content correctly Electronic Signature(s) Signed: 08/21/2021 5:16:41 PM By: Lorrin Jackson Entered By: Lorrin Jackson on 08/21/2021 14:03:14 -------------------------------------------------------------------------------- Wound Assessment Details Patient Name: Date of Service: [name] 08/21/2021 1:15 PM Medical Record Number: [id number] Patient Account Number: [id number] Date of Birth/Sex: Treating RN: 04/17/1928 (87 y.o. Sue Lush Primary Care Braileigh Landenberger: Tivis Ringer Other Clinician: Referring Beni Turrell: Treating Bettie Swavely/Extender: Anne Fu Weeks in Treatment: 0 Wound Status Wound Number: 6 Primary Trauma, Other Etiology: Wound Location: Right Forearm Wound Open Wounding Event: Trauma Status: Date Acquired: 08/11/2021 Comorbid Chronic sinus problems/congestion, Anemia, Aspiration, Arrhythmia, Weeks Of Treatment: 0 History: Hypertension, Peripheral Venous Disease, Osteomyelitis Clustered Wound: No Photos Wound Measurements Length: (cm) 4.5 Width: (cm) 2.8 Depth: (cm) 0.1 Area: (cm) 9.896 Volume: (cm) 0.99 % Reduction in Area: 0% % Reduction in Volume: 0% Epithelialization: None Tunneling: No Undermining: No Wound Description Classification: Full Thickness Without Exposed Support Structures Wound Margin: Distinct, outline attached Exudate Amount: Medium Exudate Type: Serosanguineous Exudate Color: red, brown Foul Odor After Cleansing: No Slough/Fibrino Yes Wound Bed Granulation Amount: Large (67-100%) Exposed Structure Granulation Quality: Red Fascia Exposed: No Necrotic Amount: Small (1-33%) Fat Layer (Subcutaneous Tissue) Exposed: Yes Necrotic Quality: Eschar, Adherent Slough Tendon Exposed: No Muscle Exposed: No Joint Exposed: No Bone Exposed: No Electronic Signature(s) Signed: 08/21/2021 4:35:59 PM By: Lorrin Jackson Entered By: Lorrin Jackson on 08/21/2021 16:35:59 -------------------------------------------------------------------------------- Vitals Details Patient Name: Date of Service: [name] 08/21/2021 1:15 PM Medical Record Number: [id number] Patient Account Number: [id number] Date of Birth/Sex: Treating RN: 1928-03-06 (87 y.o. Sue Lush Primary Care Jazlynn Nemetz: Tivis Ringer Other Clinician: Referring Dorothe Elmore: Treating Budd Freiermuth/Extender: Langley Gauss, Pilar Plate Weeks in Treatment: 0 Vital Signs Time Taken: 13:45 Temperature (F): 98.4 Pulse (bpm): 70 Respiratory Rate (breaths/min): 16 Blood Pressure (mmHg): 141/84 Reference Range:  80 - 120 mg / dl Electronic Signature(s) Signed: 08/21/2021 5:16:41 PM By: Lorrin Jackson Entered By: Lorrin Jackson on 08/21/2021 13:45:42

## 2021-08-26 ENCOUNTER — Other Ambulatory Visit: Payer: Self-pay

## 2021-08-26 ENCOUNTER — Encounter: Payer: Self-pay | Admitting: Podiatry

## 2021-08-26 ENCOUNTER — Ambulatory Visit: Payer: Medicare Other | Admitting: Podiatry

## 2021-08-26 ENCOUNTER — Other Ambulatory Visit: Payer: Self-pay | Admitting: *Deleted

## 2021-08-26 DIAGNOSIS — M79675 Pain in left toe(s): Secondary | ICD-10-CM

## 2021-08-26 DIAGNOSIS — M79674 Pain in right toe(s): Secondary | ICD-10-CM | POA: Diagnosis not present

## 2021-08-26 DIAGNOSIS — I110 Hypertensive heart disease with heart failure: Secondary | ICD-10-CM

## 2021-08-26 DIAGNOSIS — B351 Tinea unguium: Secondary | ICD-10-CM

## 2021-08-26 NOTE — Progress Notes (Signed)
  Subjective:  Patient ID: [name], female    DOB: 1928-08-17,   MRN: [id number]  Chief Complaint  Patient presents with   Nail Problem    Toenails are thick and discolored and have some curling in to them and I am not a diabetic    87 y.o. female presents for thickened elongated and painful toenails that have been present for years. Last had her nails trimmed by Dr. Amalia Hailey in 2017. Relates pain in the nails and difficult to trim. She is on coumadin. Denies diabetes.  . Denies any other pedal complaints. Denies n/v/f/c.   Past Medical History:  Diagnosis Date   A-fib ([place])    On chronic anticoagulation   Allergy    Dysrhythmia    hx of atrial fibrilation   GERD (gastroesophageal reflux disease)    Headache(784.0)    Hyperlipidemia    Hypertension    Osteoporosis    Peripheral vascular disease ([facility])    Pneumonia    PONV (postoperative nausea and vomiting)     Objective:  Physical Exam: Vascular: DP/PT pulses 2/4 bilateral. CFT <3 seconds. Decreased hair growth and skin atrophy noted.  Skin. No lacerations or abrasions bilateral feet. Nails 1-5 are thickened discolored and elongated with subungual debris.  Musculoskeletal: MMT 5/5 bilateral lower extremities in DF, PF, Inversion and Eversion. Deceased ROM in DF of ankle joint.  Neurological: Sensation intact to light touch.   Assessment:   1. Onychomycosis   2. Pain due to onychomycosis of toenails of both feet   3. Hypertensive heart failure ([place])      Plan:  Patient was evaluated and treated and all questions answered. -Discussed and educated patient on foot care, especially with  regards to the vascular, neurological and musculoskeletal systems.  -Discussed supportive shoes at all times and checking feet regularly.  -Mechanically debrided all nails 1-5 bilateral using sterile nail nipper and filed with dremel without incident  -Answered all patient questions -Patient to return  in 3 months for at risk foot  care -Patient advised to call the office if any problems or questions arise in the meantime.   Lorenda Peck, DPM

## 2021-08-28 ENCOUNTER — Encounter (HOSPITAL_BASED_OUTPATIENT_CLINIC_OR_DEPARTMENT_OTHER): Payer: Medicare Other | Admitting: Internal Medicine

## 2021-08-28 ENCOUNTER — Other Ambulatory Visit: Payer: Self-pay

## 2021-08-28 NOTE — Progress Notes (Addendum)
[name] ([id number]) Visit Report for 08/28/2021 Arrival Information Details Patient Name: Date of Service: [name], [name] 08/28/2021 2:45 PM Medical Record Number: [id number] Patient Account Number: [id number] Date of Birth/Sex: Treating RN: 27-Feb-1928 (87 y.o. F) Primary Care Kennette Cuthrell: Tivis Ringer Other Clinician: Referring Topher Buenaventura: Treating Taline Nass/Extender: Anne Fu Weeks in Treatment: 1 Visit Information History Since Last Visit Added or deleted any medications: No Patient Arrived: Cane Any new allergies or adverse reactions: No Arrival Time: 15:37 Had a fall or experienced change in No Accompanied By: caregiver activities of daily living that may affect Transfer Assistance: Manual risk of falls: Patient Identification Verified: Yes Signs or symptoms of abuse/neglect since last visito No Secondary Verification Process Completed: Yes Hospitalized since last visit: No Patient Requires Transmission-Based Precautions: No Implantable device outside of the clinic excluding No Patient Has Alerts: Yes cellular tissue based products placed in the center Patient Alerts: Patient on Blood Thinner since last visit: Has Dressing in Place as Prescribed: Yes Pain Present Now: No Electronic Signature(s) Signed: 08/28/2021 4:04:47 PM By: Sandre Kitty Entered By: Sandre Kitty on 08/28/2021 15:37:52 -------------------------------------------------------------------------------- Encounter Discharge Information Details Patient Name: Date of Service: [name] 08/28/2021 2:45 PM Medical Record Number: [id number] Patient Account Number: [id number] Date of Birth/Sex: Treating RN: 10-05-1928 (87 y.o. [name] Primary Care Delise Simenson: Tivis Ringer Other Clinician: Referring Marzella Miracle: Treating Tanvi Gatling/Extender: April Holding in Treatment: 1 Encounter Discharge Information Items Discharge  Condition: Stable Ambulatory Status: Ambulatory Discharge Destination: Home Transportation: Private Auto Accompanied By: self Schedule Follow-up Appointment: Yes Clinical Summary of Care: Patient Declined Electronic Signature(s) Signed: 08/28/2021 5:03:29 PM By: Rhae Hammock RN Entered By: Rhae Hammock on 08/28/2021 15:52:02 -------------------------------------------------------------------------------- Lower Extremity Assessment Details Patient Name: Date of Service: [name] 08/28/2021 2:45 PM Medical Record Number: [id number] Patient Account Number: [id number] Date of Birth/Sex: Treating RN: 1928/01/07 (87 y.o. [name] Primary Care Naphtali Riede: Tivis Ringer Other Clinician: Referring Waller Marcussen: Treating Ida Uppal/Extender: Langley Gauss, Pilar Plate Weeks in Treatment: 1 Electronic Signature(s) Signed: 08/28/2021 5:03:29 PM By: Rhae Hammock RN Entered By: Rhae Hammock on 08/28/2021 15:46:35 -------------------------------------------------------------------------------- Multi Wound Chart Details Patient Name: Date of Service: [name] 08/28/2021 2:45 PM Medical Record Number: [id number] Patient Account Number: [id number] Date of Birth/Sex: Treating RN: Mar 01, 1928 (87 y.o. F) Primary Care Dylon Correa: Tivis Ringer Other Clinician: Referring Kristen Fromm: Treating Ka Bench/Extender: Langley Gauss, Ravisankar R Weeks in Treatment: 1 Vital Signs Height(in): Pulse(bpm): 60 Weight(lbs): Blood Pressure(mmHg): 141/82 Body Mass Index(BMI): Temperature(F): 97.8 Respiratory Rate(breaths/min): 16 Photos: [N/A:N/A] Right Forearm N/A N/A Wound Location: Trauma N/A N/A Wounding Event: Trauma, Other N/A N/A Primary Etiology: Chronic sinus problems/congestion, N/A N/A Comorbid History: Anemia, Aspiration, Arrhythmia, Hypertension, Peripheral Venous Disease, Osteomyelitis 08/11/2021 N/A N/A Date Acquired: 1  N/A N/A Weeks of Treatment: Open N/A N/A Wound Status: 0.3x0.3x0.1 N/A N/A Measurements L x W x D (cm) 0.071 N/A N/A A (cm) : rea 0.007 N/A N/A Volume (cm) : 99.30% N/A N/A % Reduction in Area: 99.30% N/A N/A % Reduction in Volume: Full Thickness Without Exposed N/A N/A Classification: Support Structures Medium N/A N/A Exudate Amount: Serosanguineous N/A N/A Exudate Type: red, brown N/A N/A Exudate Color: Distinct, outline attached N/A N/A Wound Margin: Large (67-100%) N/A N/A Granulation Amount: Red N/A N/A Granulation Quality: Small (1-33%) N/A N/A Necrotic Amount: Eschar, Adherent Slough N/A N/A Necrotic Tissue: Fat Layer (Subcutaneous Tissue): Yes N/A N/A Exposed Structures: Fascia: No Tendon: No Muscle: No Joint:  No Bone: No None N/A N/A Epithelialization: Treatment Notes Wound #6 (Forearm) Wound Laterality: Right Cleanser Soap and Water Discharge Instruction: May shower and wash wound with dial antibacterial soap and water prior to dressing change. Peri-Wound Care Topical Primary Dressing Xeroform Occlusive Gauze Dressing, 4x4 in Discharge Instruction: Apply to wound bed as instructed Secondary Dressing Woven Gauze Sponge, Non-Sterile 4x4 in Discharge Instruction: Apply over primary dressing as directed. Secured With Conforming Stretch Gauze Bandage, Sterile 2x75 (in/in) Discharge Instruction: Secure with stretch gauze as directed. Transpore Surgical Tape, 2x10 (in/yd) Discharge Instruction: Secure dressing with tape as directed. Compression Wrap Compression Stockings Add-Ons Electronic Signature(s) Signed: 08/28/2021 5:16:34 PM By: Linton Ham MD Entered By: Linton Ham on 08/28/2021 16:00:01 -------------------------------------------------------------------------------- Multi-Disciplinary Care Plan Details Patient Name: Date of Service: [name] 08/28/2021 2:45 PM Medical Record Number: [id number] Patient Account Number:  [id number] Date of Birth/Sex: Treating RN: July 05, 1928 (87 y.o. [name] Primary Care Saidee Geremia: Tivis Ringer Other Clinician: Referring Hava Massingale: Treating Donyell Carrell/Extender: Langley Gauss, Pilar Plate Weeks in Treatment: 1 Active Inactive Electronic Signature(s) Signed: 10/28/2021 11:41:44 AM By: Baruch Gouty RN, BSN Signed: 10/30/2021 12:38:30 PM By: Rhae Hammock RN Previous Signature: 08/28/2021 5:03:29 PM Version By: Rhae Hammock RN Entered By: Baruch Gouty on 09/17/2021 10:55:08 -------------------------------------------------------------------------------- Pain Assessment Details Patient Name: Date of Service: [name] 08/28/2021 2:45 PM Medical Record Number: [id number] Patient Account Number: [id number] Date of Birth/Sex: Treating RN: Jul 12, 1928 (87 y.o. F) Primary Care Vinette Crites: Tivis Ringer Other Clinician: Referring Leanore Biggers: Treating Sage Hammill/Extender: Langley Gauss, Pilar Plate Weeks in Treatment: 1 Active Problems Location of Pain Severity and Description of Pain Patient Has Paino No Site Locations Pain Management and Medication Current Pain Management: Electronic Signature(s) Signed: 08/28/2021 4:04:47 PM By: Sandre Kitty Entered By: Sandre Kitty on 08/28/2021 15:38:17 -------------------------------------------------------------------------------- Patient/Caregiver Education Details Patient Name: Date of Service: [name] 11/17/2022andnbsp2:45 PM Medical Record Number: [id number] Patient Account Number: [id number] Date of Birth/Gender: Treating RN: 1927/12/03 (87 y.o. [name] Primary Care Physician: Tivis Ringer Other Clinician: Referring Physician: Treating Physician/Extender: April Holding in Treatment: 1 Education Assessment Education Provided To: Patient Education Topics Provided Wound/Skin Impairment: Methods:  Explain/Verbal Responses: State content correctly Electronic Signature(s) Signed: 08/28/2021 5:03:29 PM By: Rhae Hammock RN Entered By: Rhae Hammock on 08/28/2021 15:50:09 -------------------------------------------------------------------------------- Wound Assessment Details Patient Name: Date of Service: [name] 08/28/2021 2:45 PM Medical Record Number: [id number] Patient Account Number: [id number] Date of Birth/Sex: Treating RN: 1928/04/20 (87 y.o. F) Primary Care Kedra Mcglade: Tivis Ringer Other Clinician: Referring Breda Bond: Treating Jayden Kratochvil/Extender: Langley Gauss, Pilar Plate Weeks in Treatment: 1 Wound Status Wound Number: 6 Primary Trauma, Other Etiology: Wound Location: Right Forearm Wound Open Wounding Event: Trauma Status: Date Acquired: 08/11/2021 Comorbid Chronic sinus problems/congestion, Anemia, Aspiration, Arrhythmia, Weeks Of Treatment: 1 History: Hypertension, Peripheral Venous Disease, Osteomyelitis Clustered Wound: No Photos Wound Measurements Length: (cm) 0.3 Width: (cm) 0.3 Depth: (cm) 0.1 Area: (cm) 0.071 Volume: (cm) 0.007 % Reduction in Area: 99.3% % Reduction in Volume: 99.3% Epithelialization: None Wound Description Classification: Full Thickness Without Exposed Support Structures Wound Margin: Distinct, outline attached Exudate Amount: Medium Exudate Type: Serosanguineous Exudate Color: red, brown Foul Odor After Cleansing: No Slough/Fibrino Yes Wound Bed Granulation Amount: Large (67-100%) Exposed Structure Granulation Quality: Red Fascia Exposed: No Necrotic Amount: Small (1-33%) Fat Layer (Subcutaneous Tissue) Exposed: Yes Necrotic Quality: Eschar, Adherent Slough Tendon Exposed: No Muscle Exposed: No Joint Exposed: No Bone Exposed: No Electronic Signature(s) Signed: 08/28/2021  4:04:47 PM By: Sandre Kitty Entered By: Sandre Kitty on 08/28/2021  15:41:38 -------------------------------------------------------------------------------- [place] Details Patient Name: Date of Service: [name] 08/28/2021 2:45 PM Medical Record Number: [id number] Patient Account Number: [id number] Date of Birth/Sex: Treating RN: [birth year] (87 y.o. F) Primary Care Meosha Castanon: Tivis Ringer Other Clinician: Referring Maddix Kliewer: Treating [place] Flett/Extender: Langley Gauss, Pilar Plate Weeks in Treatment: 1 Vital Signs Time Taken: 15:37 Temperature (F): 97.8 Pulse (bpm): 60 Respiratory Rate (breaths/min): 16 Blood Pressure (mmHg): 141/82 Reference Range: 80 - 120 mg / dl Electronic Signature(s) Signed: 08/28/2021 4:04:47 PM By: Sandre Kitty Entered By: Sandre Kitty on 08/28/2021 15:38:11

## 2021-08-28 NOTE — Progress Notes (Signed)
[name], [name] ([id number]) Visit Report for 08/28/2021 HPI Details Patient Name: Date of Service: [name], [name] 08/28/2021 2:45 PM Medical Record Number: [id number] Patient Account Number: [id number] Date of Birth/Sex: Treating RN: [birth year] (87 y.o. F) Primary Care Provider: Tivis Ringer Other Clinician: Referring Provider: Treating Provider/Extender: Langley Gauss, Pilar Plate Weeks in Treatment: 1 History of Present Illness Location: left lower extremity on her shin Quality: Patient reports experiencing a dull pain to affected area(s). Severity: Patient states wound(s) are getting better. Duration: Patient has had the wound for < 3 weeks prior to presenting for treatment Timing: Pain in wound is constant (hurts all the time) Context: The wound occurred when the patient had a fall and had a lacerated wound to her left shin which was treated in the ER Modifying Factors: Patient wound(s)/ulcer(s) are improving due AY:TKZSW care ssociated Signs and Symptoms: Patient reports having increase swelling. A HPI Description: 87 year old patient who is known to have chronic atrial fibrillation and on Coumadin had a fall on November 13 and had a large laceration on her left lower extremity in the pretibial area and was seen in the ER by Dr. Bridgett Larsson who needed to control the venous bleeding with sutures. The patient was then followed up by Dr. Kellie Simmering at the vascular office to see if she needed any workup for varicose veins. He did note good arterial flow and a few varicosities in the medial thigh and medial calf with some hyperpigmentation of the lower left leg. However a bedside ultrasound revealed that there was no significant left greater saphenous vein or prominent veins in the calf area which needed surgery. He noted there was no significant arterial insufficiency or venous disease and recommended no further intervention or evaluation. He referred the patient was for wound  care. Past medical history significant for A. fib on Coumadin, hyperlipidemia hypertension peripheral vascular disease and history of venous surgery 50 years ago with a left leg vein stripping. READMISSION 08/21/2021 This is now a 87 year old woman who has had several stays in this clinic most recently in 2017 at which time she had a fall and a laceration on her left anterior tibia. She was cared for by Dr. Con Memos at this time. Her current problem began sometime at the end of October she had a fall and was seen in the ER with an ecchymosis of her chest. She is on Coumadin her labs were normal. However apparently in this same fall she also had a skin tear on her right wrist. They have been using dry dressing and came in with this Steri-Stripped. Past medical history includes chronic atrial fibrillation on Coumadin, some degree of cognitive impairment, peripheral vascular disease. She lives in her own home with aides. Her daughter is a Marine scientist at [employer] 11/17; fall with a skin tear on the right dorsal wrist. This is almost completely epithelialized today. Electronic Signature(s) Signed: 08/28/2021 5:16:34 PM By: Linton Ham MD Entered By: Linton Ham on 08/28/2021 16:00:32 -------------------------------------------------------------------------------- Physical Exam Details Patient Name: Date of Service: [name] 08/28/2021 2:45 PM Medical Record Number: [id number] Patient Account Number: [id number] Date of Birth/Sex: Treating RN: Oct 02, 1928 (87 y.o. F) Primary Care Provider: Tivis Ringer Other Clinician: Referring Provider: Treating Provider/Extender: Langley Gauss, Pilar Plate Weeks in Treatment: 1 Constitutional Sitting or standing Blood Pressure is within target range for patient.. Pulse regular and within target range for patient.Marland Kitchen Respirations regular, non-labored and within target range.. Temperature is normal and within the target range for the patient.Marland Kitchen  Appears in no distress. Notes Wound exam; right dorsal wrist and lower arm. All of these are superficial. Should be fully epithelialized by next week.. Her radial pulses are palpable and there is no evidence of surrounding infection. Electronic Signature(s) Signed: 08/28/2021 5:16:34 PM By: Linton Ham MD Entered By: Linton Ham on 08/28/2021 16:01:53 -------------------------------------------------------------------------------- Physician Orders Details Patient Name: Date of Service: [name] 08/28/2021 2:45 PM Medical Record Number: [id number] Patient Account Number: [id number] Date of Birth/Sex: Treating RN: 27-Feb-1928 (87 y.o. [name] Primary Care Provider: Tivis Ringer Other Clinician: Referring Provider: Treating Provider/Extender: Anne Fu Weeks in Treatment: 1 Verbal / Phone Orders: No Diagnosis Coding Follow-up Appointments Return A ppointment in 2 weeks. Other: - Halo=Supplies Bathing/ Shower/ Hygiene May shower and wash wound with soap and water. - when changing dressing Additional Orders / Instructions Follow Nutritious Diet Wound Treatment Wound #6 - Forearm Wound Laterality: Right Cleanser: Soap and Water 1 x Per Day/30 Days Discharge Instructions: May shower and wash wound with dial antibacterial soap and water prior to dressing change. Prim Dressing: Xeroform Occlusive Gauze Dressing, 4x4 in 1 x Per Day/30 Days ary Discharge Instructions: Apply to wound bed as instructed Secondary Dressing: Woven Gauze Sponge, Non-Sterile 4x4 in 1 x Per Day/30 Days Discharge Instructions: Apply over primary dressing as directed. Secured With: Child psychotherapist, Sterile 2x75 (in/in) 1 x Per Day/30 Days Discharge Instructions: Secure with stretch gauze as directed. Secured With: Transpore Surgical Tape, 2x10 (in/yd) 1 x Per Day/30 Days Discharge Instructions: Secure dressing with tape as  directed. Electronic Signature(s) Signed: 08/28/2021 5:03:29 PM By: Rhae Hammock RN Signed: 08/28/2021 5:16:34 PM By: Linton Ham MD Entered By: Rhae Hammock on 08/28/2021 16:00:49 -------------------------------------------------------------------------------- Problem List Details Patient Name: Date of Service: [name] 08/28/2021 2:45 PM Medical Record Number: [id number] Patient Account Number: [id number] Date of Birth/Sex: Treating RN: 25-Aug-1928 (87 y.o. F) Primary Care Provider: Tivis Ringer Other Clinician: Referring Provider: Treating Provider/Extender: Langley Gauss, Pilar Plate Weeks in Treatment: 1 Active Problems ICD-10 Encounter Code Description Active Date MDM Diagnosis S40.811D Abrasion of right upper arm, subsequent encounter 08/21/2021 No Yes S61.501S Unspecified open wound of right wrist, sequela 08/21/2021 No Yes Inactive Problems Resolved Problems Electronic Signature(s) Signed: 08/28/2021 5:16:34 PM By: Linton Ham MD Entered By: Linton Ham on 08/28/2021 15:59:55 -------------------------------------------------------------------------------- Progress Note Details Patient Name: Date of Service: [name] 08/28/2021 2:45 PM Medical Record Number: [id number] Patient Account Number: [id number] Date of Birth/Sex: Treating RN: 03/18/1928 (87 y.o. F) Primary Care Provider: Tivis Ringer Other Clinician: Referring Provider: Treating Provider/Extender: Langley Gauss, Pilar Plate Weeks in Treatment: 1 Subjective History of Present Illness (HPI) The following HPI elements were documented for the patient's wound: Location: left lower extremity on her shin Quality: Patient reports experiencing a dull pain to affected area(s). Severity: Patient states wound(s) are getting better. Duration: Patient has had the wound for < 3 weeks prior to presenting for treatment Timing: Pain in wound is constant (hurts all  the time) Context: The wound occurred when the patient had a fall and had a lacerated wound to her left shin which was treated in the ER Modifying Factors: Patient wound(s)/ulcer(s) are improving due BW:GYKZL care Associated Signs and Symptoms: Patient reports having increase swelling. 87 year old patient who is known to have chronic atrial fibrillation and on Coumadin had a fall on November 13 and had a large laceration on her left lower extremity in the pretibial area and was  seen in the ER by Dr. Bridgett Larsson who needed to control the venous bleeding with sutures. The patient was then followed up by Dr. Kellie Simmering at the vascular office to see if she needed any workup for varicose veins. He did note good arterial flow and a few varicosities in the medial thigh and medial calf with some hyperpigmentation of the lower left leg. However a bedside ultrasound revealed that there was no significant left greater saphenous vein or prominent veins in the calf area which needed surgery. He noted there was no significant arterial insufficiency or venous disease and recommended no further intervention or evaluation. He referred the patient was for wound care. Past medical history significant for A. fib on Coumadin, hyperlipidemia hypertension peripheral vascular disease and history of venous surgery 50 years ago with a left leg vein stripping. READMISSION 08/21/2021 This is now a 87 year old woman who has had several stays in this clinic most recently in 2017 at which time she had a fall and a laceration on her left anterior tibia. She was cared for by Dr. Con Memos at this time. Her current problem began sometime at the end of October she had a fall and was seen in the ER with an ecchymosis of her chest. She is on Coumadin her labs were normal. However apparently in this same fall she also had a skin tear on her right wrist. They have been using dry dressing and came in with this Steri-Stripped. Past medical history  includes chronic atrial fibrillation on Coumadin, some degree of cognitive impairment, peripheral vascular disease. She lives in her own home with aides. Her daughter is a Marine scientist at [employer] 11/17; fall with a skin tear on the right dorsal wrist. This is almost completely epithelialized today. Objective Constitutional Sitting or standing Blood Pressure is within target range for patient.. Pulse regular and within target range for patient.Marland Kitchen Respirations regular, non-labored and within target range.. Temperature is normal and within the target range for the patient.Marland Kitchen Appears in no distress. Vitals Time Taken: 3:37 PM, Temperature: 97.8 F, Pulse: 60 bpm, Respiratory Rate: 16 breaths/min, Blood Pressure: 141/82 mmHg. General Notes: Wound exam; right dorsal wrist and lower arm. All of these are superficial. Should be fully epithelialized by next week.. Her radial pulses are palpable and there is no evidence of surrounding infection. Integumentary (Hair, Skin) Wound #6 status is Open. Original cause of wound was Trauma. The date acquired was: 08/11/2021. The wound has been in treatment 1 weeks. The wound is located on the Right Forearm. The wound measures 0.3cm length x 0.3cm width x 0.1cm depth; 0.071cm^2 area and 0.007cm^3 volume. There is Fat Layer (Subcutaneous Tissue) exposed. There is a medium amount of serosanguineous drainage noted. The wound margin is distinct with the outline attached to the wound base. There is large (67-100%) red granulation within the wound bed. There is a small (1-33%) amount of necrotic tissue within the wound bed including Eschar and Adherent Slough. Assessment Active Problems ICD-10 Abrasion of right upper arm, subsequent encounter Unspecified open wound of right wrist, sequela Plan Follow-up Appointments: Return Appointment in 2 weeks. Other: - [facility]=Supplies Bathing/ Shower/ Hygiene: May shower and wash wound with soap and water. - when changing  dressing Additional Orders / Instructions: Follow Nutritious Diet WOUND #6: - Forearm Wound Laterality: Right Cleanser: Soap and Water 1 x Per Day/30 Days Discharge Instructions: May shower and wash wound with dial antibacterial soap and water prior to dressing change. Prim Dressing: Xeroform Occlusive Gauze Dressing, 4x4 in 1 x  Per Day/30 Days ary Discharge Instructions: Apply to wound bed as instructed Secondary Dressing: Woven Gauze Sponge, Non-Sterile 4x4 in 1 x Per Day/30 Days Discharge Instructions: Apply over primary dressing as directed. Secured With: Child psychotherapist, Sterile 2x75 (in/in) 1 x Per Day/30 Days Discharge Instructions: Secure with stretch gauze as directed. Secured With: Transpore Surgical T ape, 2x10 (in/yd) 1 x Per Day/30 Days Discharge Instructions: Secure dressing with tape as directed. 1. Continued with the Xeroform and gauze. 2 to almost assuredly the patient will be healed in 2 weeks by the next time we next see her. Her daughter who is a Marine scientist is changing the dressing. If that is the case they can call and cancel the appointment Electronic Signature(s) Signed: 08/28/2021 5:16:34 PM By: Linton Ham MD Entered By: Linton Ham on 08/28/2021 16:02:44 -------------------------------------------------------------------------------- SuperBill Details Patient Name: Date of Service: [name] 08/28/2021 Medical Record Number: [id number] Patient Account Number: [id number] Date of Birth/Sex: Treating RN: 01-14-1928 (87 y.o. F) Primary Care Provider: Tivis Ringer Other Clinician: Referring Provider: Treating Provider/Extender: Langley Gauss, Pilar Plate Weeks in Treatment: 1 Diagnosis Coding ICD-10 Codes Code Description [phone] Abrasion of right upper arm, subsequent encounter S61.501S Unspecified open wound of right wrist, sequela Physician Procedures : CPT4 Code Description Modifier [id number] - WC PHYS LEVEL  2 - EST PT ICD-10 Diagnosis Description S40.811D Abrasion of right upper arm, subsequent encounter S61.501S Unspecified open wound of right wrist, sequela Quantity: 1 Electronic Signature(s) Signed: 08/28/2021 5:16:34 PM By: Linton Ham MD Entered By: Linton Ham on 08/28/2021 16:03:03

## 2021-09-11 ENCOUNTER — Encounter (HOSPITAL_BASED_OUTPATIENT_CLINIC_OR_DEPARTMENT_OTHER): Payer: Medicare Other | Admitting: Internal Medicine

## 2021-10-04 ENCOUNTER — Other Ambulatory Visit: Payer: Self-pay | Admitting: Cardiology

## 2021-10-04 DIAGNOSIS — F3289 Other specified depressive episodes: Secondary | ICD-10-CM

## 2021-11-25 ENCOUNTER — Other Ambulatory Visit: Payer: Self-pay

## 2021-11-25 ENCOUNTER — Ambulatory Visit: Payer: Medicare Other | Admitting: Podiatry

## 2021-11-25 ENCOUNTER — Encounter: Payer: Self-pay | Admitting: Podiatry

## 2021-11-25 DIAGNOSIS — B351 Tinea unguium: Secondary | ICD-10-CM

## 2021-11-25 DIAGNOSIS — M79675 Pain in left toe(s): Secondary | ICD-10-CM | POA: Diagnosis not present

## 2021-11-25 DIAGNOSIS — M79674 Pain in right toe(s): Secondary | ICD-10-CM

## 2021-11-25 DIAGNOSIS — I110 Hypertensive heart disease with heart failure: Secondary | ICD-10-CM | POA: Diagnosis not present

## 2021-11-25 NOTE — Progress Notes (Signed)
°  Subjective:  Patient ID: [name], female    DOB: Aug 08, 1928,   MRN: [id number]  No chief complaint on file.   88 y.o. female presents for thickened elongated and painful toenails that have been present for years. Relates pain in the nails and difficult to trim. She is on coumadin. Denies diabetes.  . Denies any other pedal complaints. Denies n/v/f/c.   Past Medical History:  Diagnosis Date   A-fib ([place])    On chronic anticoagulation   Allergy    Dysrhythmia    hx of atrial fibrilation   GERD (gastroesophageal reflux disease)    Headache(784.0)    Hyperlipidemia    Hypertension    Osteoporosis    Peripheral vascular disease (HCC)    Pneumonia    PONV (postoperative nausea and vomiting)     Objective:  Physical Exam: Vascular: DP/PT pulses 2/4 bilateral. CFT <3 seconds. Decreased hair growth and skin atrophy noted.  Skin. No lacerations or abrasions bilateral feet. Nails 1-5 are thickened discolored and elongated with subungual debris.  Musculoskeletal: MMT 5/5 bilateral lower extremities in DF, PF, Inversion and Eversion. Deceased ROM in DF of ankle joint.  Neurological: Sensation intact to light touch.   Assessment:   1. Pain due to onychomycosis of toenails of both feet   2. Onychomycosis   3. Hypertensive heart failure ([place])       Plan:  Patient was evaluated and treated and all questions answered. -Discussed and educated patient on foot care, especially with  regards to the vascular, neurological and musculoskeletal systems.  -Discussed supportive shoes at all times and checking feet regularly.  -Mechanically debrided all nails 1-5 bilateral using sterile nail nipper and filed with dremel without incident  -Answered all patient questions -Patient to return  in 3 months for at risk foot care -Patient advised to call the office if any problems or questions arise in the meantime.   Lorenda Peck, DPM

## 2021-12-16 ENCOUNTER — Other Ambulatory Visit: Payer: Self-pay | Admitting: Cardiology

## 2021-12-16 DIAGNOSIS — I10 Essential (primary) hypertension: Secondary | ICD-10-CM

## 2021-12-16 DIAGNOSIS — I480 Paroxysmal atrial fibrillation: Secondary | ICD-10-CM

## 2022-01-21 ENCOUNTER — Encounter: Payer: Self-pay | Admitting: Podiatry

## 2022-02-24 ENCOUNTER — Ambulatory Visit: Payer: Medicare Other | Admitting: Podiatry

## 2022-03-04 ENCOUNTER — Encounter: Payer: Self-pay | Admitting: Podiatry

## 2022-03-04 ENCOUNTER — Ambulatory Visit (INDEPENDENT_AMBULATORY_CARE_PROVIDER_SITE_OTHER): Payer: Medicare Other | Admitting: Podiatry

## 2022-03-04 DIAGNOSIS — B351 Tinea unguium: Secondary | ICD-10-CM

## 2022-03-04 DIAGNOSIS — M79675 Pain in left toe(s): Secondary | ICD-10-CM | POA: Diagnosis not present

## 2022-03-04 DIAGNOSIS — M79674 Pain in right toe(s): Secondary | ICD-10-CM | POA: Diagnosis not present

## 2022-03-04 NOTE — Progress Notes (Signed)
  Subjective:  Patient ID: [name], female    DOB: 05-Oct-1928,   MRN: [id number]  No chief complaint on file.   88 y.o. female presents for thickened elongated and painful toenails that have been present for years. Relates pain in the nails and difficult to trim. She is on coumadin. Denies diabetes.  . Denies any other pedal complaints. Denies n/v/f/c.   Past Medical History:  Diagnosis Date   A-fib ([place])    On chronic anticoagulation   Allergy    Dysrhythmia    hx of atrial fibrilation   GERD (gastroesophageal reflux disease)    Headache(784.0)    Hyperlipidemia    Hypertension    Osteoporosis    Peripheral vascular disease (HCC)    Pneumonia    PONV (postoperative nausea and vomiting)     Objective:  Physical Exam: Vascular: DP/PT pulses 2/4 bilateral. CFT <3 seconds. Decreased hair growth and skin atrophy noted.  Skin. No lacerations or abrasions bilateral feet. Nails 1-5 are thickened discolored and elongated with subungual debris.  Musculoskeletal: MMT 5/5 bilateral lower extremities in DF, PF, Inversion and Eversion. Deceased ROM in DF of ankle joint.  Neurological: Sensation intact to light touch.   Assessment:   1. Pain due to onychomycosis of toenails of both feet   2. Onychomycosis        Plan:  Patient was evaluated and treated and all questions answered. -Discussed and educated patient on foot care, especially with  regards to the vascular, neurological and musculoskeletal systems.  -Discussed supportive shoes at all times and checking feet regularly.  -Mechanically debrided all nails 1-5 bilateral using sterile nail nipper and filed with dremel without incident  -Answered all patient questions -Patient to return  in 3 months for at risk foot care -Patient advised to call the office if any problems or questions arise in the meantime.   Lorenda Peck, DPM

## 2022-05-28 ENCOUNTER — Ambulatory Visit: Payer: Medicare Other | Admitting: Cardiology

## 2022-05-28 ENCOUNTER — Encounter: Payer: Self-pay | Admitting: Cardiology

## 2022-05-28 VITALS — BP 124/85 | HR 61 | Temp 98.8°F | Resp 16 | Ht 68.0 in | Wt 156.0 lb

## 2022-05-28 DIAGNOSIS — I10 Essential (primary) hypertension: Secondary | ICD-10-CM

## 2022-05-28 DIAGNOSIS — I480 Paroxysmal atrial fibrillation: Secondary | ICD-10-CM

## 2022-05-28 MED ORDER — WARFARIN SODIUM 2.5 MG PO TABS: 2.5000 mg | ORAL_TABLET | Freq: Every day | ORAL | 3 refills | Status: DC

## 2022-05-28 MED ORDER — SOTALOL HCL 120 MG PO TABS: ORAL_TABLET | ORAL | 2 refills | Status: AC

## 2022-05-28 NOTE — Progress Notes (Signed)
Primary Physician/Referring:  Prince Solian, MD  Patient ID: [name], female    DOB: 1927/12/13, 88 y.o.   MRN: [id number]  Subjective   Chief Complaint  Patient presents with   Follow-up   Atrial Fibrillation    HPI: [name]  is a 88 y.o. female  paroxysmal atrial fibrillation who has had atrial fibrillation ablation x 2, essential hypertension, mild orthostatic hypotension and hyperlipidemia. She had maintained sinus rhythm on high dose sotalol in spite of her age needing higher doses. No bleeding diathesis or dark, tarry stools. Coumadin is being managed by Dr. Dagmar Hait. She denies any chest pain, shortness of breath. No new symptoms. No recent fall.  Doing well and has no specific complaints.  On the left also she had developed herpetic lesions on her prior office visit and continues to have postherpetic neuralgia.  She is tolerating anticoagulation and has not had any recent fall.  Accompanied by her daughter.  Past Medical History:  Diagnosis Date   A-fib ([place])    On chronic anticoagulation   Allergy    Dysrhythmia    hx of atrial fibrilation   GERD (gastroesophageal reflux disease)    Headache(784.0)    Hyperlipidemia    Hypertension    Osteoporosis    Peripheral vascular disease (HCC)    Pneumonia    PONV (postoperative nausea and vomiting)     Past Surgical History:  Procedure Laterality Date   BUNIONECTOMY     CARDIAC ELECTROPHYSIOLOGY MAPPING AND ABLATION     FRACTURE SURGERY     HEMIARTHROPLASTY HIP Right 04/08/2014   HIP ARTHROPLASTY Right 04/08/2014   Procedure: ARTHROPLASTY BIPOLAR HIP;  Surgeon: Mcarthur Rossetti, MD;  Location: [facility];  Service: Orthopedics;  Laterality: Right;   Social History   Tobacco Use   Smoking status: Former    Packs/day: 0.50    Years: 4.00    Total pack years: 2.00    Types: Cigarettes    Quit date: 1952    Years since quitting: 71.6   Smokeless tobacco: Former  Substance Use Topics   Alcohol use: Yes     Comment: occas wine    Family History  Problem Relation Age of Onset   Aneurysm Mother    Tuberculosis Father     Review of Systems  Cardiovascular:  Positive for chest pain. Negative for dyspnea on exertion and leg swelling.  Skin:  Positive for rash.  Musculoskeletal:  Positive for arthritis.  Gastrointestinal:  Negative for melena.  Neurological:  Positive for dizziness (occasional).   Objective  Blood pressure 124/85, pulse 61, temperature 98.8 F (37.1 C), temperature source Temporal, resp. rate 16, height '5\' 8"'$  (1.727 m), SpO2 90 %. Body mass index is 23.97 kg/m.     05/28/2022    2:20 PM 08/11/2021    1:45 PM 08/11/2021    1:43 PM  Vitals with BMI  Height '5\' 8"'$     Systolic 295    Diastolic 85    Pulse 61 68 97     Physical Exam Constitutional:      General: She is not in acute distress.    Appearance: She is well-developed.  Neck:     Vascular: No carotid bruit or JVD.  Cardiovascular:     Rate and Rhythm: Normal rate and regular rhythm.     Pulses:          Dorsalis pedis pulses are 1+ on the right side and 1+ on the left side.  Posterior tibial pulses are 1+ on the right side and 1+ on the left side.     Heart sounds: Normal heart sounds. No murmur heard.    No gallop.  Pulmonary:     Effort: Pulmonary effort is normal.     Breath sounds: Normal breath sounds.  Abdominal:     General: Bowel sounds are normal.     Palpations: Abdomen is soft.  Musculoskeletal:        General: No swelling. Normal range of motion.  Skin:    Comments: Extensive herpes zoster skin rash noted on the left torso.  Serosanguineous discharge present.  No foul smell.   Radiology: No results found.  Laboratory examination:   Cholesterol, total 152.000 m 04/08/2022 HDL 53.000 mg 04/08/2022 LDL 81.000 mg 04/08/2022 Triglycerides 92.000 mg 04/08/2022  A1C 5.600 % 04/08/2022 TSH 2.030 04/08/2022  Hemoglobin 13.500 g/d 08/11/2021  Creatinine, Serum 0.570 mg/  08/11/2021 Potassium 4.700 mEq 04/08/2022 ALT (SGPT) 20.000 IU/ 04/08/2022   Medications   Current Outpatient Medications:    acetaminophen (TYLENOL) 500 MG tablet, Take 500 mg by mouth every 6 (six) hours as needed for mild pain., Disp: , Rfl:    amLODipine (NORVASC) 2.5 MG tablet, TAKE 1 TABLET BY MOUTH EVERY DAY, Disp: 90 tablet, Rfl: 3   EPINEPHrine 0.3 mg/0.3 mL IJ SOAJ injection, Inject into the muscle., Disp: , Rfl:    fexofenadine (ALLEGRA) 180 MG tablet, Take 180 mg by mouth daily as needed. , Disp: , Rfl:    gabapentin (NEURONTIN) 100 MG capsule, TAKE 1-2 CAPSULES BY MOUTH 3 TIMES A DAY AS NEEDED PAIN, CAUTION ON SEDATION, Disp: , Rfl:    KLOR-CON M10 10 MEQ tablet, Take 10 mEq by mouth 2 (two) times daily., Disp: , Rfl:    LAGEVRIO 200 MG CAPS capsule, SMARTSIG:4 Capsule(s) By Mouth Every 12 Hours, Disp: , Rfl:    ondansetron (ZOFRAN) 4 MG tablet, Take 4 mg by mouth every 6 (six) hours as needed., Disp: , Rfl:    potassium chloride (KLOR-CON) 10 MEQ tablet, Take 1 tablet (10 mEq total) by mouth 2 (two) times daily., Disp: 180 tablet, Rfl: 3   pravastatin (PRAVACHOL) 40 MG tablet, Take 40 mg by mouth daily., Disp: , Rfl:    traMADol (ULTRAM) 50 MG tablet, Take 50 mg by mouth every 8 (eight) hours as needed. for pain, Disp: , Rfl:    venlafaxine XR (EFFEXOR-XR) 75 MG 24 hr capsule, TAKE 1 CAPSULE BY MOUTH DAILY WITH BREAKFAST., Disp: 90 capsule, Rfl: 1   Vitamin D, Ergocalciferol, (DRISDOL) 50000 UNITS CAPS capsule, Take 50,000 Units by mouth every 7 (seven) days. Wednesday, Disp: , Rfl:    clotrimazole-betamethasone (LOTRISONE) cream, Apply to affected area 2 times daily prn, Disp: 45 g, Rfl: 0   nitrofurantoin, macrocrystal-monohydrate, (MACROBID) 100 MG capsule, Take 100 mg by mouth every 12 (twelve) hours., Disp: , Rfl:    sotalol (BETAPACE) 120 MG tablet, TAKE 1&1/2 TABLETS BY MOUTH 2 TIMES DAILY., Disp: 270 tablet, Rfl: 2   warfarin (COUMADIN) 2.5 MG tablet, Take 1 tablet (2.5 mg  total) by mouth daily at 4 PM. As directed by your MD, Disp: 90 tablet, Rfl: 3   Cardiac Studies:   Echocardiogram 02/13/2016: Left ventricle cavity is normal in size. Mild concentric hypertrophy of the left ventricle. Normal global wall motion. Doppler evidence of grade I (impaired) diastolic dysfunction. Calculated EF 60%. Left atrial cavity is severely dilated at 5.0 cm. Right atrial cavity is mildly dilated. Moderate aortic  regurgitation. Mild mitral regurgitation. Moderate tricuspid regurgitation. Mild to moderate pulmonary hypertension. Pulmonary artery systolic pressure is estimated at 39 mm Hg. Mild pulmonic regurgitation.  Carotid artery duplex 02/10/2021: Right Carotid: There is no evidence of stenosis in the right ICA. Left Carotid: There is no evidence of stenosis in the left ICA. Vertebrals:  Bilateral vertebral arteries demonstrate antegrade flow. Subclavians: Normal flow hemodynamics were seen in bilateral subclavian  arteries.  EKG: EKG 05/28/2022: Sinus rhythm with first-degree AV block at the rate of 61 bpm, normal axis, no evidence of ischemia, normal EKG.  Single PAC.  No significant change from 05/08/2021.   Assessment     ICD-10-CM   1. Paroxysmal atrial fibrillation (HCC)  I48.0 EKG 12-Lead    warfarin (COUMADIN) 2.5 MG tablet    sotalol (BETAPACE) 120 MG tablet    2. Essential hypertension  I10      CHA2DS2-VASc Score is 4.  Yearly risk of stroke: 4.8% (A, F, HTN).  Score of 1=0.6; 2=2.2; 3=3.2; 4=4.8; 5=7.2; 6=9.8; 7=>9.8) -(CHF; HTN; vasc disease DM,  Female = 1; Age <65 =0; 65-74 = 1,  >75 =2; stroke/embolism= 2).    Recommendations:   [name]  is a 88 y.o. female  paroxysmal atrial fibrillation who has had atrial fibrillation ablation x 2, essential hypertension, mild orthostatic hypotension and hyperlipidemia. She had maintained sinus rhythm on high dose sotalol in spite of her age needing higher doses. She experiences extreme fatigue and shortness  of breath with A. Fib onset.  She is accompanied by her daughter.  Patient has no specific complaints except for postherpetic neuralgia and pain.  She is tolerating anticoagulation.  No recent fall.  Reviewed her labs, CBC has remained stable and renal function is normal.  EKG reveals maintenance of sinus rhythm and QT is normal in spite of high-dose sotalol.  From cardiac standpoint she remained stable, she is tolerating anticoagulation which is being managed by PCP.  I will see her back in a year or sooner if problems.   Adrian Prows, MD, [facility] 05/31/2022, 5:31 PM Office: [phone]

## 2022-05-31 ENCOUNTER — Encounter: Payer: Self-pay | Admitting: Cardiology

## 2022-06-09 ENCOUNTER — Ambulatory Visit (INDEPENDENT_AMBULATORY_CARE_PROVIDER_SITE_OTHER): Payer: Self-pay | Admitting: Podiatry

## 2022-06-09 DIAGNOSIS — Z91199 Patient's noncompliance with other medical treatment and regimen due to unspecified reason: Secondary | ICD-10-CM

## 2022-06-09 NOTE — Progress Notes (Signed)
No show

## 2022-10-10 ENCOUNTER — Encounter (HOSPITAL_BASED_OUTPATIENT_CLINIC_OR_DEPARTMENT_OTHER): Payer: Self-pay | Admitting: Emergency Medicine

## 2022-10-10 ENCOUNTER — Emergency Department (HOSPITAL_BASED_OUTPATIENT_CLINIC_OR_DEPARTMENT_OTHER): Payer: Medicare Other

## 2022-10-10 ENCOUNTER — Emergency Department (HOSPITAL_BASED_OUTPATIENT_CLINIC_OR_DEPARTMENT_OTHER): Payer: Medicare Other | Admitting: Radiology

## 2022-10-10 ENCOUNTER — Inpatient Hospital Stay (HOSPITAL_BASED_OUTPATIENT_CLINIC_OR_DEPARTMENT_OTHER)
Admission: EM | Admit: 2022-10-10 | Discharge: 2022-10-14 | DRG: 070 | Disposition: A | Discharge status: Home Health | Payer: Medicare Other | Attending: Internal Medicine | Admitting: Internal Medicine

## 2022-10-10 ENCOUNTER — Encounter (HOSPITAL_COMMUNITY): Payer: Self-pay

## 2022-10-10 DIAGNOSIS — I482 Chronic atrial fibrillation, unspecified: Secondary | ICD-10-CM | POA: Diagnosis present

## 2022-10-10 DIAGNOSIS — R4182 Altered mental status, unspecified: Secondary | ICD-10-CM | POA: Diagnosis present

## 2022-10-10 DIAGNOSIS — M4800 Spinal stenosis, site unspecified: Secondary | ICD-10-CM | POA: Diagnosis present

## 2022-10-10 DIAGNOSIS — G9341 Metabolic encephalopathy: Principal | ICD-10-CM | POA: Diagnosis present

## 2022-10-10 DIAGNOSIS — G936 Cerebral edema: Secondary | ICD-10-CM | POA: Diagnosis present

## 2022-10-10 DIAGNOSIS — K219 Gastro-esophageal reflux disease without esophagitis: Secondary | ICD-10-CM | POA: Diagnosis present

## 2022-10-10 DIAGNOSIS — Z6826 Body mass index (BMI) 26.0-26.9, adult: Secondary | ICD-10-CM

## 2022-10-10 DIAGNOSIS — Z882 Allergy status to sulfonamides status: Secondary | ICD-10-CM

## 2022-10-10 DIAGNOSIS — F0390 Unspecified dementia without behavioral disturbance: Secondary | ICD-10-CM | POA: Diagnosis present

## 2022-10-10 DIAGNOSIS — M810 Age-related osteoporosis without current pathological fracture: Secondary | ICD-10-CM | POA: Diagnosis present

## 2022-10-10 DIAGNOSIS — E440 Moderate protein-calorie malnutrition: Secondary | ICD-10-CM | POA: Diagnosis present

## 2022-10-10 DIAGNOSIS — Z87891 Personal history of nicotine dependence: Secondary | ICD-10-CM

## 2022-10-10 DIAGNOSIS — R404 Transient alteration of awareness: Secondary | ICD-10-CM

## 2022-10-10 DIAGNOSIS — I739 Peripheral vascular disease, unspecified: Secondary | ICD-10-CM | POA: Diagnosis present

## 2022-10-10 DIAGNOSIS — Z96641 Presence of right artificial hip joint: Secondary | ICD-10-CM | POA: Diagnosis present

## 2022-10-10 DIAGNOSIS — I110 Hypertensive heart disease with heart failure: Secondary | ICD-10-CM | POA: Diagnosis present

## 2022-10-10 DIAGNOSIS — Z7901 Long term (current) use of anticoagulants: Secondary | ICD-10-CM

## 2022-10-10 DIAGNOSIS — R4701 Aphasia: Secondary | ICD-10-CM | POA: Diagnosis present

## 2022-10-10 DIAGNOSIS — E785 Hyperlipidemia, unspecified: Secondary | ICD-10-CM | POA: Diagnosis present

## 2022-10-10 DIAGNOSIS — Z79899 Other long term (current) drug therapy: Secondary | ICD-10-CM

## 2022-10-10 DIAGNOSIS — J324 Chronic pansinusitis: Secondary | ICD-10-CM | POA: Diagnosis present

## 2022-10-10 DIAGNOSIS — I480 Paroxysmal atrial fibrillation: Secondary | ICD-10-CM | POA: Diagnosis present

## 2022-10-10 DIAGNOSIS — Z888 Allergy status to other drugs, medicaments and biological substances status: Secondary | ICD-10-CM

## 2022-10-10 DIAGNOSIS — Z91040 Latex allergy status: Secondary | ICD-10-CM

## 2022-10-10 DIAGNOSIS — Z66 Do not resuscitate: Secondary | ICD-10-CM | POA: Diagnosis present

## 2022-10-10 DIAGNOSIS — H109 Unspecified conjunctivitis: Secondary | ICD-10-CM | POA: Diagnosis present

## 2022-10-10 DIAGNOSIS — D320 Benign neoplasm of cerebral meninges: Secondary | ICD-10-CM | POA: Diagnosis present

## 2022-10-10 DIAGNOSIS — G8929 Other chronic pain: Secondary | ICD-10-CM | POA: Diagnosis present

## 2022-10-10 DIAGNOSIS — I4891 Unspecified atrial fibrillation: Secondary | ICD-10-CM | POA: Diagnosis present

## 2022-10-10 DIAGNOSIS — Z881 Allergy status to other antibiotic agents status: Secondary | ICD-10-CM

## 2022-10-10 DIAGNOSIS — Z1152 Encounter for screening for COVID-19: Secondary | ICD-10-CM

## 2022-10-10 DIAGNOSIS — I5032 Chronic diastolic (congestive) heart failure: Secondary | ICD-10-CM | POA: Diagnosis present

## 2022-10-10 DIAGNOSIS — Z91041 Radiographic dye allergy status: Secondary | ICD-10-CM

## 2022-10-10 LAB — URINALYSIS, ROUTINE W REFLEX MICROSCOPIC
Bilirubin Urine: NEGATIVE
Glucose, UA: NEGATIVE mg/dL
Ketones, ur: NEGATIVE mg/dL
Leukocytes,Ua: NEGATIVE
Nitrite: NEGATIVE
Protein, ur: 30 mg/dL — AB
RBC / HPF: >50 RBC/hpf — ABNORMAL HIGH (ref 0–5)
Specific Gravity, Urine: 1.021 (ref 1.005–1.030)
pH: 6.5 (ref 5.0–8.0)

## 2022-10-10 LAB — BASIC METABOLIC PANEL
Anion gap: 10 (ref 5–15)
BUN: 17 mg/dL (ref 8–23)
CO2: 27 mmol/L (ref 22–32)
Calcium: 9.2 mg/dL (ref 8.9–10.3)
Chloride: 103 mmol/L (ref 98–111)
Creatinine, Ser: 0.65 mg/dL (ref 0.44–1.00)
GFR, Estimated: >60 mL/min (ref >60)
Glucose, Bld: 143 mg/dL — ABNORMAL HIGH (ref 70–99)
Potassium: 4.3 mmol/L (ref 3.5–5.1)
Sodium: 140 mmol/L (ref 135–145)

## 2022-10-10 LAB — CBC
HCT: 43.7 % (ref 36.0–46.0)
Hemoglobin: 13.8 g/dL (ref 12.0–15.0)
MCH: 30.2 pg (ref 26.0–34.0)
MCHC: 31.6 g/dL (ref 30.0–36.0)
MCV: 95.6 fL (ref 80.0–100.0)
Platelets: 166 10*3/uL (ref 150–400)
RBC: 4.57 MIL/uL (ref 3.87–5.11)
RDW: 14.5 % (ref 11.5–15.5)
WBC: 6.3 10*3/uL (ref 4.0–10.5)
nRBC: 0 % (ref 0.0–0.2)

## 2022-10-10 LAB — HEPATIC FUNCTION PANEL
ALT: 11 U/L (ref 0–44)
AST: 15 U/L (ref 15–41)
Albumin: 4.1 g/dL (ref 3.5–5.0)
Alkaline Phosphatase: 46 U/L (ref 38–126)
Bilirubin, Direct: 0.2 mg/dL (ref 0.0–0.2)
Indirect Bilirubin: 0.6 mg/dL (ref 0.3–0.9)
Total Bilirubin: 0.8 mg/dL (ref 0.3–1.2)
Total Protein: 7.2 g/dL (ref 6.5–8.1)

## 2022-10-10 LAB — LACTIC ACID, PLASMA: Lactic Acid, Venous: 1 mmol/L (ref 0.5–1.9)

## 2022-10-10 LAB — RESP PANEL BY RT-PCR (RSV, FLU A&B, COVID)  RVPGX2
Influenza A by PCR: NEGATIVE
Influenza B by PCR: NEGATIVE
Resp Syncytial Virus by PCR: NEGATIVE
SARS Coronavirus 2 by RT PCR: NEGATIVE

## 2022-10-10 LAB — PROTIME-INR
INR: 1.8 — ABNORMAL HIGH (ref 0.8–1.2)
Prothrombin Time: 20.7 seconds — ABNORMAL HIGH (ref 11.4–15.2)

## 2022-10-10 MED ORDER — TRAMADOL HCL 50 MG PO TABS
50.0000 mg | ORAL_TABLET | Freq: Three times a day (TID) | ORAL | Status: DC | PRN
  Administered 2022-10-10 – 2022-10-14 (×5): 50 mg via ORAL
  Filled 2022-10-10 (×5): qty 1

## 2022-10-10 MED ORDER — WARFARIN SODIUM 2.5 MG PO TABS: 2.5000 mg | ORAL_TABLET | Freq: Every day | ORAL | Status: DC

## 2022-10-10 MED ORDER — ACETAMINOPHEN 500 MG PO TABS
500.0000 mg | ORAL_TABLET | Freq: Four times a day (QID) | ORAL | Status: DC | PRN
  Administered 2022-10-10 – 2022-10-13 (×3): 500 mg via ORAL
  Filled 2022-10-10 (×4): qty 1

## 2022-10-10 MED ORDER — ERYTHROMYCIN 5 MG/GM OP OINT
TOPICAL_OINTMENT | Freq: Four times a day (QID) | OPHTHALMIC | Status: DC
  Filled 2022-10-10: qty 3.5

## 2022-10-10 MED ORDER — SOTALOL HCL 120 MG PO TABS
120.0000 mg | ORAL_TABLET | Freq: Two times a day (BID) | ORAL | Status: DC
  Filled 2022-10-10 (×2): qty 1

## 2022-10-10 MED ORDER — OFLOXACIN 0.3 % OP SOLN
1.0000 [drp] | Freq: Four times a day (QID) | OPHTHALMIC | Status: DC
  Administered 2022-10-11 – 2022-10-14 (×11): 1 [drp] via OPHTHALMIC
  Filled 2022-10-10: qty 5

## 2022-10-10 MED ORDER — AMLODIPINE BESYLATE 5 MG PO TABS
2.5000 mg | ORAL_TABLET | Freq: Every day | ORAL | Status: DC
  Administered 2022-10-10 – 2022-10-11 (×2): 2.5 mg via ORAL
  Filled 2022-10-10 (×2): qty 1

## 2022-10-10 MED ORDER — WARFARIN - PHARMACIST DOSING INPATIENT
Freq: Every day | Status: DC
  Filled 2022-10-10: qty 1

## 2022-10-10 MED ORDER — DOXYCYCLINE HYCLATE 100 MG PO TABS
50.0000 mg | ORAL_TABLET | Freq: Every day | ORAL | Status: DC
  Administered 2022-10-11 – 2022-10-12 (×2): 50 mg via ORAL
  Filled 2022-10-10 (×2): qty 1

## 2022-10-10 MED ORDER — GABAPENTIN 100 MG PO CAPS
200.0000 mg | ORAL_CAPSULE | Freq: Three times a day (TID) | ORAL | Status: DC
  Administered 2022-10-10 – 2022-10-14 (×11): 200 mg via ORAL
  Filled 2022-10-10 (×11): qty 2

## 2022-10-10 MED ORDER — WARFARIN SODIUM 2.5 MG PO TABS: 3.7500 mg | ORAL_TABLET | Freq: Once | ORAL | Status: DC

## 2022-10-10 MED ORDER — METHENAMINE MANDELATE 0.5 G PO TABS
1.0000 g | ORAL_TABLET | Freq: Two times a day (BID) | ORAL | Status: DC
  Administered 2022-10-11 – 2022-10-14 (×7): 1 g via ORAL
  Filled 2022-10-10: qty 1
  Filled 2022-10-10: qty 2
  Filled 2022-10-10 (×4): qty 1
  Filled 2022-10-10: qty 2
  Filled 2022-10-10 (×3): qty 1

## 2022-10-10 MED ORDER — PRAVASTATIN SODIUM 40 MG PO TABS
40.0000 mg | ORAL_TABLET | Freq: Every day | ORAL | Status: DC
  Administered 2022-10-11 – 2022-10-14 (×4): 40 mg via ORAL
  Filled 2022-10-10 (×5): qty 1

## 2022-10-10 MED ORDER — WARFARIN SODIUM 2.5 MG PO TABS
2.5000 mg | ORAL_TABLET | Freq: Once | ORAL | Status: AC
  Administered 2022-10-10: 2.5 mg via ORAL
  Filled 2022-10-10: qty 1

## 2022-10-10 MED ORDER — MELATONIN 5 MG PO TABS
5.0000 mg | ORAL_TABLET | Freq: Every day | ORAL | Status: DC
  Administered 2022-10-11 – 2022-10-13 (×3): 5 mg via ORAL
  Filled 2022-10-10 (×4): qty 1

## 2022-10-10 NOTE — ED Notes (Signed)
Assisted to bsc 2 person assist, had moderate soft bm

## 2022-10-10 NOTE — ED Notes (Signed)
Pt's daughter gave pt her personal medicaiton dose as [facility] was out of doxycyline and pravastatin, along with pt's new eye drops prescribe by pt's ophthalmologist. [name] notified and voiced okay [name] to give her mother her personal medication so there is no delay in therapy   Pt personal medication given  Pravastatin 40 mg Doxycyline 50 mg

## 2022-10-10 NOTE — ED Notes (Signed)
Pt removed purwick, this RN assisted caregiver with applying new purwick, clean brief apply, pt readjusted in bed and warm blanket given, no other needs or concerns at this time

## 2022-10-10 NOTE — Progress Notes (Signed)
ANTICOAGULATION CONSULT NOTE - Initial Consult  Pharmacy Consult for warfarin  Indication: atrial fibrillation  Allergies  Allergen Reactions   Contrast Media [Iodinated Contrast Media] Anaphylaxis   Dronedarone Other (See Comments)    CHF   Other Other (See Comments)    She is allergic to MRI contrast media per daughter Pt states she is allergic to heart medications, can not recall names/ steroid injections ivp dye. ivp dye, reported MRI contrast   Amiodarone     Pulmonary edema   Flecainide     Parkinson like raction   Latex Itching and Swelling   Multaq [Dronedarone Hydrochloride] Swelling   Sulfa Antibiotics Other (See Comments)    Makes her sicker & it causes her to have dark rings around her eyes    Patient Measurements: Height: '5\' 7"'$  (170.2 cm) Weight: 76.2 kg (168 lb) IBW/kg (Calculated) : 61.6  Vital Signs: Temp: 98.6 F (37 C) (12/30 1725) Temp Source: Oral (12/30 1725) BP: 158/99 (12/30 1930) Pulse Rate: 88 (12/30 1930)  Labs: Recent Labs    10/10/22 1057  HGB 13.8  HCT 43.7  PLT 166  LABPROT 20.7*  INR 1.8*  CREATININE 0.65    Estimated Creatinine Clearance: 47.7 mL/min (by C-G formula based on SCr of 0.65 mg/dL).   Medical History: Past Medical History:  Diagnosis Date   A-fib ([place])    On chronic anticoagulation   Allergy    Dysrhythmia    hx of atrial fibrilation   GERD (gastroesophageal reflux disease)    Headache(784.0)    Hyperlipidemia    Hypertension    Osteoporosis    Peripheral vascular disease (HCC)    Pneumonia    PONV (postoperative nausea and vomiting)    Assessment: Patient on warfarin PTA for hx of Afib. Per nursing regimen at home is 2.'5mg'$  every day based on discussion with daughter but that her regimen has been changed around frequently. Last dose on 12/29 was 2.'5mg'$ . INR today 1.8.   Goal of Therapy:  INR 2-3 Monitor platelets by anticoagulation protocol: Yes   Plan:  Will dose with home dose of 2.'5mg'$ , limited  doses available in [employer]  Repeat INR in AM for further dosing.  F/u warfarin regimen upon transfer / med rec   Esmeralda Arthur, PharmD, BCCCP  10/10/2022,8:32 PM

## 2022-10-10 NOTE — ED Notes (Signed)
Caregiver given a recliner to rest in, pt's daughter and son have went home for the evening. Pt resting NAD noted, observed even RR and unlabored, on cardiac monitor, side rails up x2 for safety, plan of care ongoing, care giver expresses no needs or concerns at this time, call light within reach, no further concerns as of present.

## 2022-10-10 NOTE — Progress Notes (Signed)
Patient is a 88 year old female hypertension, hyperlipidemia, A-fib who presents to the emergency department at [facility] with complaint of altered mental status.  Patient lives with her family.  She is usually alert and oriented at baseline, ambulates with the help of cane.  Family has noticed that she has become increasingly confused, unable to ambulate, completely disoriented.  On presentation she was overall hemodynamically stable, mildly hypertensive.  Lab work showed stable findings.  UA was not too reassuring for UTI.  Chest x-ray showed mild pulmonary vascular congestion.  CT head showed  2.5 cm partly calcified mass in the right frontal region,most likely meningioma,vasogenic edema around this lesion which appears slightly more prominent in comparison with the immediate previous examination of 05/08/2021.  Case was discussed with neurology who recommended MRI and transfer to [facility].  Neurology might need to be consulted after admission.

## 2022-10-10 NOTE — ED Notes (Signed)
Med reconciliation complete with pts son.

## 2022-10-10 NOTE — ED Provider Notes (Cosign Needed)
[facility] EMERGENCY DEPT Provider Note   CSN: [id number] Arrival date & time: 10/10/22  1022     History  Chief Complaint  Patient presents with   Weakness    [name] is a 88 y.o. female.  Patient with history of afib on warfarin, hypertension presents from home with complaints of altered mental status.  Patient presents with her son who provides history to patient since change in mental status.  Patient's son states that 2 to 3 days ago the patient was able to ambulate with her cane and was able to read and perform basic tasks without assistance.  He states that over the past 48 hours the patient has had a significant change in cognitive abilities.  She is now unable to walk without assistance and is extremely confused.  She is unable to say where she is or the year which is new.  Patient's son states that she has a history of recurrent UTIs and was most recently treated with antibiotics 2 weeks ago.  She also saw urology at that time concern for chronic hematuria and had a bladder biopsy at that did not reveal any acute findings.  Patient's son is concerned that she may have another UTI contributing to her symptoms.  Of note, patient has also had some green drainage from both of her eyes for the last few days and has been being treated with prescription antibiotic eyedrops.  She has had bacterial conjunctivitis several times in the past.  Patient denies any trouble with vision or any pain.  Of note, patient comes from home where she lives alone with a caregiver who is present during the day.  She normally ambulates with a cane.  Level 5 caveat --- altered mental status  The history is provided by the patient and a relative. No language interpreter was used.  Weakness      Home Medications Prior to Admission medications   Medication Sig Start Date End Date Taking? Authorizing Provider  acetaminophen (TYLENOL) 500 MG tablet Take 500 mg by mouth every 6 (six) hours  as needed for mild pain.    [provider]  amLODipine (NORVASC) 2.5 MG tablet TAKE 1 TABLET BY MOUTH EVERY DAY 12/17/21   Adrian Prows, MD  clotrimazole-betamethasone (LOTRISONE) cream Apply to affected area 2 times daily prn 05/09/21   Orpah Greek, MD  EPINEPHrine 0.3 mg/0.3 mL IJ SOAJ injection Inject into the muscle. 04/30/18   [provider]  fexofenadine (ALLEGRA) 180 MG tablet Take 180 mg by mouth daily as needed.     [provider]  gabapentin (NEURONTIN) 100 MG capsule TAKE 1-2 CAPSULES BY MOUTH 3 TIMES A DAY AS NEEDED PAIN, CAUTION ON SEDATION 07/01/21   [provider]  KLOR-CON M10 10 MEQ tablet Take 10 mEq by mouth 2 (two) times daily. 06/27/21   [provider]  LAGEVRIO 200 MG CAPS capsule SMARTSIG:4 Capsule(s) By Mouth Every 12 Hours 08/11/21   [provider]  nitrofurantoin, macrocrystal-monohydrate, (MACROBID) 100 MG capsule Take 100 mg by mouth every 12 (twelve) hours. 08/10/21   [provider]  ondansetron (ZOFRAN) 4 MG tablet Take 4 mg by mouth every 6 (six) hours as needed. 06/04/21   [provider]  potassium chloride (KLOR-CON) 10 MEQ tablet Take 1 tablet (10 mEq total) by mouth 2 (two) times daily. 12/17/20   Adrian Prows, MD  pravastatin (PRAVACHOL) 40 MG tablet Take 40 mg by mouth daily.    [provider]  sotalol (BETAPACE) 120 MG tablet TAKE 1&1/2 TABLETS BY MOUTH 2 TIMES DAILY. 05/28/22   Adrian Prows, MD  traMADol (ULTRAM) 50 MG tablet Take 50 mg by mouth every 8 (eight) hours as needed. for pain 02/16/19   [provider]  venlafaxine XR (EFFEXOR-XR) 75 MG 24 hr capsule TAKE 1 CAPSULE BY MOUTH DAILY WITH BREAKFAST. 10/07/21   Adrian Prows, MD  Vitamin D, Ergocalciferol, (DRISDOL) 50000 UNITS CAPS capsule Take 50,000 Units by mouth every 7 (seven) days. Wednesday    [provider]  warfarin (COUMADIN) 2.5 MG tablet Take 1 tablet (2.5 mg total) by mouth daily at 4 PM. As  directed by your MD 05/28/22   Adrian Prows, MD      Allergies    Contrast media [iodinated contrast media], Dronedarone, Other, Amiodarone, Flecainide, Latex, Multaq [dronedarone hydrochloride], and Sulfa antibiotics    Review of Systems   Review of Systems  Unable to perform ROS: Mental status change  Neurological:  Positive for weakness.  Psychiatric/Behavioral:  Positive for confusion.   All other systems reviewed and are negative.   Physical Exam Updated Vital Signs BP (!) 148/107   Pulse 64   Temp 99.1 F (37.3 C) (Oral)   Resp 18   SpO2 97%  Physical Exam Vitals and nursing note reviewed.  Constitutional:      General: She is not in acute distress.    Appearance: Normal appearance. She is normal weight. She is not ill-appearing, toxic-appearing or diaphoretic.  HENT:     Head: Normocephalic and atraumatic.  Eyes:     Extraocular Movements: Extraocular movements intact.     Pupils: Pupils are equal, round, and reactive to light.     Comments: Conjunctival erythema bilaterally with purulent drainage consistent with bacterial conjunctivitis.  Cardiovascular:     Rate and Rhythm: Normal rate and regular rhythm.     Heart sounds: Normal heart sounds.  Pulmonary:     Effort: Pulmonary effort is normal. No respiratory distress.     Breath sounds: Normal breath sounds.  Abdominal:     General: Abdomen is flat.     Palpations: Abdomen is soft.  Musculoskeletal:        General: Normal range of motion.     Cervical back: Normal range of motion and neck supple.  Skin:    General: Skin is warm and dry.  Neurological:     General: No focal deficit present.     Mental Status: She is alert.     Cranial Nerves: No cranial nerve deficit.     Comments: Patient alert and oriented to self only.  She does not answer questions appropriately but is able to follow commands and is moving all extremities equally  Psychiatric:        Mood and Affect: Mood normal.        Behavior:  Behavior normal.     ED Results / Procedures / Treatments   Labs (all labs ordered are listed, but only abnormal results are displayed) Labs Reviewed  BASIC METABOLIC PANEL - Abnormal; Notable for the following components:      Result Value   Glucose, Bld 143 (*)    All other components within normal limits  URINALYSIS, ROUTINE W REFLEX MICROSCOPIC - Abnormal; Notable for the following components:   Hgb urine dipstick LARGE (*)    Protein, ur 30 (*)    RBC / HPF >50 (*)    Bacteria, UA RARE (*)    All other components  within normal limits  PROTIME-INR - Abnormal; Notable for the following components:   Prothrombin Time 20.7 (*)    INR 1.8 (*)    All other components within normal limits  RESP PANEL BY RT-PCR (RSV, FLU A&B, COVID)  RVPGX2  URINE CULTURE  CBC  LACTIC ACID, PLASMA  HEPATIC FUNCTION PANEL  PROTIME-INR    EKG EKG Interpretation  Date/Time:  Saturday October 10 2022 10:53:19 EST Ventricular Rate:  91 PR Interval:    QRS Duration: 84 QT Interval:  400 QTC Calculation: 492 R Axis:   28 Text Interpretation: Atrial fibrillation Nonspecific ST abnormality Prolonged QT Abnormal ECG When compared with ECG of 11-Aug-2021 12:08, PREVIOUS ECG IS PRESENT when compared to prior, now afib. No STEMI Confirmed by Antony Blackbird [phone]) on 10/10/2022 3:13:40 PM  Radiology CT HEAD WO CONTRAST  Result Date: 10/10/2022 CLINICAL DATA:  Altered mental status EXAM: CT HEAD WITHOUT CONTRAST TECHNIQUE: Contiguous axial images were obtained from the base of the skull through the vertex without intravenous contrast. RADIATION DOSE REDUCTION: This exam was performed according to the departmental dose-optimization program which includes automated exposure control, adjustment of the mA and/or kV according to patient size and/or use of iterative reconstruction technique. COMPARISON:  Previous studies including the CT done on May 18, 2021 FINDINGS: Brain: These 2.5 x 2.3 cm smooth marginated  partly calcified mass inseparable from the meninges in the right frontal region with surrounding vasogenic edema. This lesion has been seen in multiple previous studies dating as far back as 01/08/2007. Vasogenic edema around the lesion is slightly more prominent in the current study. There are no signs of bleeding within the cranium. Cortical sulci are prominent. There is no significant shift of midline structures. Ventricles are unremarkable. Vascular: Unremarkable. Skull: Unremarkable. Sinuses/Orbits: There is almost complete opacification in frontal, ethmoid and sphenoid sinuses. There residual worsening of this finding since 18-May-2021. Other: None. IMPRESSION: There is 2.5 cm partly calcified mass in the right frontal region, most likely meningioma. There is vasogenic edema around this lesion which appears slightly more prominent in comparison with the immediate previous examination of 05-18-2021. There are no signs of bleeding within the cranium. Atrophy. Small-vessel disease. Electronically Signed   By: Elmer Picker M.D.   On: 10/10/2022 14:07   DG Chest 1 View  Result Date: 10/10/2022 CLINICAL DATA:  Cough. EXAM: CHEST  1 VIEW COMPARISON:  08/11/2021 FINDINGS: Enlarged cardiac silhouette with a mild increase in size. Tortuous and partially calcified thoracic aorta. Possible hiatal hernia. Clear lungs. Mildly prominent pulmonary vasculature. Unremarkable bones. IMPRESSION: 1. Mild cardiomegaly and mild pulmonary vascular congestion. 2. Possible hiatal hernia. Electronically Signed   By: Claudie Revering M.D.   On: 10/10/2022 13:58    Procedures Procedures    Medications Ordered in ED Medications  acetaminophen (TYLENOL) tablet 500 mg (500 mg Oral Given 10/10/22 2103)  amLODipine (NORVASC) tablet 2.5 mg (2.5 mg Oral Given 10/10/22 2104)  gabapentin (NEURONTIN) capsule 200 mg (200 mg Oral Given 10/10/22 2104)  Melatonin LIQD 5 mg (5 mg Oral Not Given 10/10/22 2131)  methenamine (HIPREX)  tablet 1 g (has no administration in time range)  ofloxacin (OCUFLOX) 0.3 % ophthalmic solution 1 drop (1 drop Both Eyes Not Given 10/10/22 2132)  pravastatin (PRAVACHOL) tablet 40 mg (40 mg Oral Not Given 10/10/22 2131)  sotalol (BETAPACE) tablet 120 mg (120 mg Oral Not Given 10/10/22 2132)  traMADol (ULTRAM) tablet 50 mg (50 mg Oral Given 10/10/22 2103)  Warfarin - Pharmacist Dosing Inpatient (has no  administration in time range)  doxycycline (ADOXA) tablet 50 mg (50 mg Oral Not Given 10/10/22 2130)  warfarin (COUMADIN) tablet 2.5 mg (2.5 mg Oral Given 10/10/22 2103)    ED Course/ Medical Decision Making/ A&P                           Medical Decision Making Amount and/or Complexity of Data Reviewed Labs: ordered. Radiology: ordered.  Risk OTC drugs. Prescription drug management. Decision regarding hospitalization.   This patient is a 87 y.o. female who presents to the ED for concern of altered mental status, this involves an extensive number of treatment options, and is a complaint that carries with it a high risk of complications and morbidity. The emergent differential diagnosis prior to evaluation includes, but is not limited to,  Drug overdose or withdrawal, hypoxia, hyper/hypoglycemia, encephalopathy, sepsis, DKA/HHS, brain lesion, CVA, seizure, hypothermia, heat stroke, psychiatric  This is not an exhaustive differential.   Past Medical History / Co-morbidities / Social History: History of recurrent UTIs  Additional history: Chart reviewed. Pertinent results include: Patient saw urology in mid December for her recurrent UTIs and had a bladder biopsy that did not reveal any cancer or other abnormalities.  She also had an unremarkable renal ultrasound.  She also just completed a course of antibiotics for management of UTIs.  Physical Exam: Physical exam performed. The pertinent findings include: Patient is alert and oriented to self only which is new.  No focal neurologic  deficits.  She is able to move all extremities and follow commands.  Lab Tests: I ordered, and personally interpreted labs.  The pertinent results include:  UA shows large hgb, proteinuria, hematuria, 11-20 WBCs and rare bacteria.  Culture pending.  INR 1.8, lactic normal.  No other acute laboratory findings.   Imaging Studies: I ordered imaging studies including CT head, chest x-ray. I independently visualized and interpreted imaging which showed   CT:   There is 2.5 cm partly calcified mass in the right frontal region, most likely meningioma. There is vasogenic edema around this lesion which appears slightly more prominent in comparison with the immediate previous examination of 05/08/2021. There are no signs of bleeding within the cranium. Atrophy. Small-vessel disease.  CXR:   1. Mild cardiomegaly and mild pulmonary vascular congestion. 2. Possible hiatal hernia.  I agree with the radiologist interpretation.   Cardiac Monitoring:  The patient was maintained on a cardiac monitor.  My attending physician Dr. Sherry Ruffing viewed and interpreted the cardiac monitored which showed an underlying rhythm of: afib, no STEMI. I agree with this interpretation.   Consultations Obtained: I requested consultation with the neurology on-call Dr. Cheral Marker,  and discussed lab and imaging findings as well as pertinent plan - they recommend: Admission to hospitalist and inpatient MRI brain for further evaluation of edema seen on CT   Disposition:  Patient presents today with complaints of altered mental status x 2 days.  She is afebrile, nontoxic-appearing, and in no acute distress with reassuring vital signs.  Laboratory evaluation is reassuring for acute findings.  Initial concern was for delirium from UTI, however patient's urine does not appear infected.  Culture is pending CT head does show some concerns for new edema surrounding a known meningioma.  Discussed same with neurology who recommends  transfer for MRI.  Given patient's acute mental status change, she will need admission for further evaluation.  Patient and family are understanding and amenable with plan.  Discussed patient  with hospitalist who agrees to admit.  Patient's evening meds have been ordered and given.  Findings and plan of care discussed with supervising physician Dr. Sherry Ruffing who is in agreement.    Final Clinical Impression(s) / ED Diagnoses Final diagnoses:  Altered mental status, unspecified altered mental status type    Rx / DC Orders ED Discharge Orders     None         Bud Face, Hershal Coria 10/10/22 2238

## 2022-10-10 NOTE — ED Notes (Signed)
Daughter at bedside, asking about pts coumadin and eye drops. Pt has severe bilateral eye infection and is on 2 eye drops and doxy. Med rec has been performed and PA aware, to update family .

## 2022-10-10 NOTE — ED Notes (Addendum)
Pyxis does not have Sotalol or melatonin at [facility], PA-C Clarise Cruz has verbalized that pt's daughter [name]) may give pt her personal medicaiton that she has at the bedside, this RN and PA-C witnessed medicaiton dosing given to pt, pharmacy  Esmeralda Arthur, [facility] was notified of this medication administration by daughter who verbalized understanding once transfer to [facility] that pt's medication will be provided to hear dn to leave personal medication at home.  Pt personal medication given Sotalol 120 mg  Melatonin 5 mg

## 2022-10-10 NOTE — ED Triage Notes (Signed)
Pt here from home with c/o confusion  weakness and has multiple uti's in the last treated 2 weeks ago , confusion and weakness started on Thursday worse on Friday

## 2022-10-10 NOTE — ED Notes (Signed)
Daughter at the bedside requesting pt's coumadin and BP medications for nighttime dose, advised provider is aware and will order momentary, also would like provider to come in to review results of labs and scans, advised PA-C will be in shortly to answer any questions and concerns she may have, daughter verbalized understanding and is thankful.

## 2022-10-10 NOTE — ED Notes (Signed)
Patient transported to CT 

## 2022-10-11 ENCOUNTER — Observation Stay (HOSPITAL_COMMUNITY): Payer: Medicare Other

## 2022-10-11 DIAGNOSIS — I110 Hypertensive heart disease with heart failure: Secondary | ICD-10-CM | POA: Diagnosis not present

## 2022-10-11 DIAGNOSIS — Z66 Do not resuscitate: Secondary | ICD-10-CM | POA: Diagnosis not present

## 2022-10-11 DIAGNOSIS — R4182 Altered mental status, unspecified: Secondary | ICD-10-CM

## 2022-10-11 DIAGNOSIS — Z6826 Body mass index (BMI) 26.0-26.9, adult: Secondary | ICD-10-CM | POA: Diagnosis not present

## 2022-10-11 DIAGNOSIS — G8929 Other chronic pain: Secondary | ICD-10-CM | POA: Diagnosis not present

## 2022-10-11 DIAGNOSIS — R4701 Aphasia: Secondary | ICD-10-CM | POA: Diagnosis not present

## 2022-10-11 DIAGNOSIS — G936 Cerebral edema: Secondary | ICD-10-CM | POA: Diagnosis not present

## 2022-10-11 DIAGNOSIS — E440 Moderate protein-calorie malnutrition: Secondary | ICD-10-CM | POA: Diagnosis not present

## 2022-10-11 DIAGNOSIS — I480 Paroxysmal atrial fibrillation: Secondary | ICD-10-CM | POA: Diagnosis not present

## 2022-10-11 DIAGNOSIS — Z882 Allergy status to sulfonamides status: Secondary | ICD-10-CM | POA: Diagnosis not present

## 2022-10-11 DIAGNOSIS — I5032 Chronic diastolic (congestive) heart failure: Secondary | ICD-10-CM | POA: Diagnosis not present

## 2022-10-11 DIAGNOSIS — Z888 Allergy status to other drugs, medicaments and biological substances status: Secondary | ICD-10-CM | POA: Diagnosis not present

## 2022-10-11 DIAGNOSIS — I482 Chronic atrial fibrillation, unspecified: Secondary | ICD-10-CM | POA: Diagnosis not present

## 2022-10-11 DIAGNOSIS — E785 Hyperlipidemia, unspecified: Secondary | ICD-10-CM | POA: Diagnosis not present

## 2022-10-11 DIAGNOSIS — Z881 Allergy status to other antibiotic agents status: Secondary | ICD-10-CM | POA: Diagnosis not present

## 2022-10-11 DIAGNOSIS — R410 Disorientation, unspecified: Secondary | ICD-10-CM | POA: Diagnosis not present

## 2022-10-11 DIAGNOSIS — Z87891 Personal history of nicotine dependence: Secondary | ICD-10-CM | POA: Diagnosis not present

## 2022-10-11 DIAGNOSIS — F0390 Unspecified dementia without behavioral disturbance: Secondary | ICD-10-CM | POA: Diagnosis not present

## 2022-10-11 DIAGNOSIS — Z91040 Latex allergy status: Secondary | ICD-10-CM | POA: Diagnosis not present

## 2022-10-11 DIAGNOSIS — H109 Unspecified conjunctivitis: Secondary | ICD-10-CM | POA: Diagnosis not present

## 2022-10-11 DIAGNOSIS — Z91041 Radiographic dye allergy status: Secondary | ICD-10-CM | POA: Diagnosis not present

## 2022-10-11 DIAGNOSIS — Z1152 Encounter for screening for COVID-19: Secondary | ICD-10-CM | POA: Diagnosis not present

## 2022-10-11 DIAGNOSIS — D320 Benign neoplasm of cerebral meninges: Secondary | ICD-10-CM | POA: Diagnosis not present

## 2022-10-11 DIAGNOSIS — I739 Peripheral vascular disease, unspecified: Secondary | ICD-10-CM | POA: Diagnosis not present

## 2022-10-11 DIAGNOSIS — J324 Chronic pansinusitis: Secondary | ICD-10-CM | POA: Diagnosis not present

## 2022-10-11 DIAGNOSIS — G9341 Metabolic encephalopathy: Secondary | ICD-10-CM | POA: Diagnosis not present

## 2022-10-11 LAB — VITAMIN B12: Vitamin B-12: 336 pg/mL (ref 180–914)

## 2022-10-11 LAB — PROTIME-INR
INR: 1.6 — ABNORMAL HIGH (ref 0.8–1.2)
Prothrombin Time: 18.4 seconds — ABNORMAL HIGH (ref 11.4–15.2)

## 2022-10-11 LAB — TSH: TSH: 2.142 u[IU]/mL (ref 0.350–4.500)

## 2022-10-11 MED ORDER — ONDANSETRON HCL 4 MG PO TABS: 4.0000 mg | ORAL_TABLET | Freq: Four times a day (QID) | ORAL | Status: DC | PRN

## 2022-10-11 MED ORDER — AMLODIPINE BESYLATE 5 MG PO TABS
2.5000 mg | ORAL_TABLET | Freq: Once | ORAL | Status: AC
  Administered 2022-10-11: 2.5 mg via ORAL

## 2022-10-11 MED ORDER — VENLAFAXINE HCL ER 75 MG PO CP24
75.0000 mg | ORAL_CAPSULE | Freq: Every day | ORAL | Status: DC
  Administered 2022-10-11 – 2022-10-14 (×4): 75 mg via ORAL
  Filled 2022-10-11 (×4): qty 1

## 2022-10-11 MED ORDER — VENLAFAXINE HCL ER 75 MG PO CP24: 75.0000 mg | ORAL_CAPSULE | Freq: Every day | ORAL | Status: DC

## 2022-10-11 MED ORDER — POTASSIUM CHLORIDE CRYS ER 10 MEQ PO TBCR: 10.0000 meq | EXTENDED_RELEASE_TABLET | Freq: Two times a day (BID) | ORAL | Status: DC

## 2022-10-11 MED ORDER — DEXAMETHASONE SODIUM PHOSPHATE 10 MG/ML IJ SOLN
10.0000 mg | Freq: Once | INTRAMUSCULAR | Status: AC
  Administered 2022-10-11: 10 mg via INTRAVENOUS
  Filled 2022-10-11: qty 1

## 2022-10-11 MED ORDER — NON FORMULARY: 180.0000 mg | Freq: Every day | Status: DC

## 2022-10-11 MED ORDER — POTASSIUM CHLORIDE CRYS ER 10 MEQ PO TBCR
10.0000 meq | EXTENDED_RELEASE_TABLET | Freq: Two times a day (BID) | ORAL | Status: DC
  Administered 2022-10-12 – 2022-10-14 (×5): 10 meq via ORAL
  Filled 2022-10-11 (×5): qty 1

## 2022-10-11 MED ORDER — AMLODIPINE BESYLATE 5 MG PO TABS
2.5000 mg | ORAL_TABLET | Freq: Every day | ORAL | Status: DC
  Administered 2022-10-12 – 2022-10-14 (×3): 2.5 mg via ORAL
  Filled 2022-10-11 (×3): qty 1

## 2022-10-11 MED ORDER — AMLODIPINE BESYLATE 5 MG PO TABS
5.0000 mg | ORAL_TABLET | Freq: Every day | ORAL | Status: DC
  Filled 2022-10-11: qty 1

## 2022-10-11 MED ORDER — WARFARIN SODIUM 2.5 MG PO TABS
2.5000 mg | ORAL_TABLET | Freq: Once | ORAL | Status: AC
  Administered 2022-10-11: 2.5 mg via ORAL
  Filled 2022-10-11: qty 1

## 2022-10-11 MED ORDER — BACID PO TABS: 2.0000 | ORAL_TABLET | Freq: Three times a day (TID) | ORAL | Status: DC

## 2022-10-11 MED ORDER — FEXOFENADINE HCL 180 MG PO TABS: 180.0000 mg | ORAL_TABLET | Freq: Every day | ORAL | Status: DC

## 2022-10-11 MED ORDER — LORATADINE 10 MG PO TABS: 10.0000 mg | ORAL_TABLET | Freq: Every day | ORAL | Status: DC

## 2022-10-11 MED ORDER — SOTALOL HCL 120 MG PO TABS
120.0000 mg | ORAL_TABLET | Freq: Two times a day (BID) | ORAL | Status: DC
  Administered 2022-10-11 – 2022-10-14 (×7): 120 mg via ORAL
  Filled 2022-10-11 (×8): qty 1

## 2022-10-11 MED ORDER — RISAQUAD PO CAPS
1.0000 | ORAL_CAPSULE | Freq: Three times a day (TID) | ORAL | Status: DC
  Administered 2022-10-11 – 2022-10-14 (×9): 1 via ORAL
  Filled 2022-10-11 (×8): qty 1

## 2022-10-11 MED ORDER — FEXOFENADINE HCL 180 MG PO TABS
180.0000 mg | ORAL_TABLET | Freq: Every day | ORAL | Status: DC
  Administered 2022-10-12 – 2022-10-14 (×3): 180 mg via ORAL
  Filled 2022-10-11 (×5): qty 1

## 2022-10-11 MED ORDER — DEXAMETHASONE 4 MG PO TABS
4.0000 mg | ORAL_TABLET | Freq: Four times a day (QID) | ORAL | Status: DC
  Administered 2022-10-11 – 2022-10-12 (×2): 4 mg via ORAL
  Filled 2022-10-11 (×4): qty 1

## 2022-10-11 MED ORDER — SODIUM CHLORIDE 0.9 % IV SOLN: INTRAVENOUS | Status: DC

## 2022-10-11 NOTE — Progress Notes (Addendum)
Discussed with on-call neurology Dr. Malen Gauze with MRI results and EEG result.  Neurology revealed patient brain MRI which showed large area of basal edema surrounding the enlarged meningioma, and recommended Decadron 10 mg x 1 and consult neurosurgery.  Discussed with on-call neurosurgery, recommend Decadron 4 mg every 6 hours, and outpatient neurosurgery follow-up.  Neurosurgery will see the patient in AM.

## 2022-10-11 NOTE — ED Notes (Signed)
Pt continues to rest, observe even RR, pt remains on cardiac monitoring devices, no changes noted, side rails up x2 for safety, plan of care ongoing, call light within reach, with caregiver at the bedside in recliner, no further concerns as of present.

## 2022-10-11 NOTE — Evaluation (Signed)
Clinical/Bedside Swallow Evaluation Patient Details  Name: [name] MRN: [id number] Date of Birth: 23-Sep-1928  Today's Date: 10/11/2022 Time: SLP Start Time (ACUTE ONLY): 7628 SLP Stop Time (ACUTE ONLY): 1505 SLP Time Calculation (min) (ACUTE ONLY): 20 min  Past Medical History:  Past Medical History:  Diagnosis Date   A-fib ([place])    On chronic anticoagulation   Allergy    Dysrhythmia    hx of atrial fibrilation   GERD (gastroesophageal reflux disease)    Headache(784.0)    Hyperlipidemia    Hypertension    Osteoporosis    Peripheral vascular disease (H[facility])    Pneumonia    PONV (postoperative nausea and vomiting)    Past Surgical History:  Past Surgical History:  Procedure Laterality Date   BUNIONECTOMY     CARDIAC ELECTROPHYSIOLOGY MAPPING AND ABLATION     FRACTURE SURGERY     HEMIARTHROPLASTY HIP Right 04/08/2014   HIP ARTHROPLASTY Right 04/08/2014   Procedure: ARTHROPLASTY BIPOLAR HIP;  Surgeon: Mcarthur Rossetti, MD;  Location: [facility];  Service: Orthopedics;  Laterality: Right;   HPI:  Patient is a 88 y.o. female with PMH: chronic a-fib, brain meningioma, recurrent UTI's, HTN, PVD, question of CHF, chronic pain on narcotics. She was brought to the hospital by family on 10/10/22 for AMS. At time of admission, daughter reported that patient had been recently treated for UTI but her urine still looks dark and patient has had significantly reduced PO intake including solids and liquids. Patient lives at home but has hired caregivers.    Assessment / Plan / Recommendation  Clinical Impression  Patient currently presenting with what appears to be a cognitive based dysphagia as per SLP observations and daughter's report. Patient has h/o GERD in chart but daughter denies this has ever significantly impacted patient. Daughter and RN reporting that patient had trouble with pills this morning with pills remaining in oral cavity, per daughter she was "cheeking them". RN crushed  pills and mixed in applesauce and this reportedly worked well. SLP discussed different options for trying pills (whole one at a time in puree, whole one at a time with a thicker liquid) but that if crushing pills is working and patient doesn't have any pills that can't be crushed, recommendation is to continue crushing them in puree. SLP observed patient with sips of thin liquids (tea) and bites of a chocolate chip cookie. She exhibited mildly prolonged mastication with cookie and reduced strength of suction with straw, but no overt s/s aspiration or penetration and swallow initiation appeared only mildly delayed at times. SLP recommending to contine on current diet and no further skilled interventions indicated. SLP Visit Diagnosis: Dysphagia, pharyngeal phase (R13.13)    Aspiration Risk  No limitations    Diet Recommendation Regular;Thin liquid   Liquid Administration via: Cup;Straw Medication Administration: Crushed with puree Supervision: Patient able to self feed Compensations: Slow rate;Small sips/bites;Minimize environmental distractions Postural Changes: Seated upright at 90 degrees    Other  Recommendations Oral Care Recommendations: Oral care BID    Recommendations for follow up therapy are one component of a multi-disciplinary discharge planning process, led by the attending physician.  Recommendations may be updated based on patient status, additional functional criteria and insurance authorization.  Follow up Recommendations No SLP follow up      Assistance Recommended at Discharge    Functional Status Assessment Patient has had a recent decline in their functional status and demonstrates the ability to make significant improvements in function in a  reasonable and predictable amount of time.  Frequency and Duration   N/A         Prognosis   N/A     Swallow Study   General Date of Onset: 10/11/22 HPI: Patient is a 88 y.o. female with PMH: chronic a-fib, brain  meningioma, recurrent UTI's, HTN, PVD, question of CHF, chronic pain on narcotics. She was brought to the hospital by family on 10/10/22 for AMS. At time of admission, daughter reported that patient had been recently treated for UTI but her urine still looks dark and patient has had significantly reduced PO intake including solids and liquids. Patient lives at home but has hired caregivers. Type of Study: Bedside Swallow Evaluation Previous Swallow Assessment: none found Diet Prior to this Study: Regular;Thin liquids Temperature Spikes Noted: No Respiratory Status: Room air History of Recent Intubation: No Behavior/Cognition: Alert;Cooperative;Pleasant mood;Confused Oral Cavity Assessment: Within Functional Limits Oral Care Completed by SLP: No Oral Cavity - Dentition: Adequate natural dentition Vision: Functional for self-feeding Self-Feeding Abilities: Able to feed self Patient Positioning: Upright in bed Baseline Vocal Quality: Normal Volitional Cough: Cognitively unable to elicit Volitional Swallow: Able to elicit    Oral/Motor/Sensory Function Overall Oral Motor/Sensory Function: Within functional limits   Ice Chips     Thin Liquid Thin Liquid: Impaired Presentation: Straw Oral Phase Impairments: Reduced labial seal Other Comments: reduced/weak suction when drinking through straw    Nectar Thick     Honey Thick     Puree     Solid     Solid: Impaired Oral Phase Functional Implications: Impaired mastication Other Comments: mildly prolonged mastication, initially had difficulty biting into a chocolate chip cookie      Sonia Baller, MA, CCC-SLP Speech Therapy

## 2022-10-11 NOTE — Progress Notes (Signed)
ANTICOAGULATION CONSULT NOTE - Follow Up Consult   Pharmacy Consult for warfarin  Indication: atrial fibrillation  Allergies  Allergen Reactions   Contrast Media [Iodinated Contrast Media] Anaphylaxis   Dronedarone Other (See Comments)    CHF   Other Other (See Comments)    She is allergic to MRI contrast media per daughter Pt states she is allergic to heart medications, can not recall names/ steroid injections ivp dye. ivp dye, reported MRI contrast   Amiodarone     Pulmonary edema   Flecainide     Parkinson like raction   Latex Itching and Swelling   Multaq [Dronedarone Hydrochloride] Swelling   Sulfa Antibiotics Other (See Comments)    Makes her sicker & it causes her to have dark rings around her eyes    Patient Measurements: Height: '5\' 7"'$  (170.2 cm) Weight: 76.2 kg (168 lb) IBW/kg (Calculated) : 61.6  Vital Signs: Temp: 97.8 F (36.6 C) (12/31 0500) Temp Source: Axillary (12/31 0500) BP: 163/103 (12/31 0957) Pulse Rate: 98 (12/31 0957)  Labs: Recent Labs    10/10/22 1057 10/11/22 0539  HGB 13.8  --   HCT 43.7  --   PLT 166  --   LABPROT 20.7* 18.4*  INR 1.8* 1.6*  CREATININE 0.65  --      Estimated Creatinine Clearance: 47.7 mL/min (by C-G formula based on SCr of 0.65 mg/dL).   Medical History: Past Medical History:  Diagnosis Date   A-fib ([place])    On chronic anticoagulation   Allergy    Dysrhythmia    hx of atrial fibrilation   GERD (gastroesophageal reflux disease)    Headache(784.0)    Hyperlipidemia    Hypertension    Osteoporosis    Peripheral vascular disease (H[facility])    Pneumonia    PONV (postoperative nausea and vomiting)    Assessment: Patient on warfarin PTA for hx of Afib. Per nursing regimen at home is 2.'5mg'$  every day based on discussion with daughter but that her regimen has been changed around frequently. Last dose on 12/29 was 2.'5mg'$ . INR 12/30 1.8.   12/31: INR today is 1.6, below goal. Will continue to dose conservatively  given age, home regimen, and 24-36 hour delay in INR changes   Goal of Therapy:  INR 2-3 Monitor platelets by anticoagulation protocol: Yes   Plan:  Will re-dose with home dose of 2.5 mg again  Repeat INR in AM for further dosing.   Daily CBC  Vicenta Dunning, PharmD  PGY1 Pharmacy Resident

## 2022-10-11 NOTE — Evaluation (Signed)
Physical Therapy Evaluation Patient Details Name: [name] MRN: [id number] DOB: 1928/04/22 Today's Date: 10/11/2022  History of Present Illness  88 y.o. female presents to [facility] hospital on 10/10/2022 with AMS. CT head demonstrates R frontal mass with more prominent vasogenic edema than in previous studies. PMH includes afib, GERD, HA, HTN, PVD, PNA.  Clinical Impression  Pt presents to PT with deficits in functional mobility, gait, balance, strength, power, endurance, cognition. Pt demonstrates significant deficits in cognition, requiring re-orientation and with very poor memory. PT spends 5+ minutes attempting to have pt open her eyes, with noted resistance to opening with attempts at PROM of eyelids, however later during session pt spontaneously opens eyes and maintains them open for the remainder of session. Pt currently requires significant assistance to perform bed mobility, with eval limited due to hypotension in sitting. Pt will benefit from continued aggressive mobilization. PT encourages family to have pt sitting with HOB elevated and to utilize bed in chair position if visitors present to supervise. PT recommends SNF placement at this time, family to discuss rehab goals later this evening.     Recommendations for follow up therapy are one component of a multi-disciplinary discharge planning process, led by the attending physician.  Recommendations may be updated based on patient status, additional functional criteria and insurance authorization.  Follow Up Recommendations Skilled nursing-short term rehab (<3 hours/day) Can patient physically be transported by private vehicle: No    Assistance Recommended at Discharge Frequent or constant Supervision/Assistance  Patient can return home with the following  A lot of help with walking and/or transfers;A lot of help with bathing/dressing/bathroom;Assistance with cooking/housework;Assistance with feeding;Direct supervision/assist for  medications management;Direct supervision/assist for financial management;Assist for transportation;Help with stairs or ramp for entrance    Equipment Recommendations Hospital bed  Recommendations for Other Services       Functional Status Assessment Patient has had a recent decline in their functional status and demonstrates the ability to make significant improvements in function in a reasonable and predictable amount of time.     Precautions / Restrictions Precautions Precautions: Fall Precaution Comments: orthostatic supine to sitting Restrictions Weight Bearing Restrictions: No      Mobility  Bed Mobility Overal bed mobility: Needs Assistance Bed Mobility: Supine to Sit, Sit to Supine     Supine to sit: Max assist, HOB elevated Sit to supine: Max assist, HOB elevated        Transfers Overall transfer level:  (deferred 2/2 hypotension)                      Ambulation/Gait                  Stairs            Wheelchair Mobility    Modified Rankin (Stroke Patients Only)       Balance Overall balance assessment: Needs assistance Sitting-balance support: No upper extremity supported Sitting balance-Leahy Scale: Poor Sitting balance - Comments: posterior lean requiring modA initially, progresses to minG Postural control: Posterior lean                                   Pertinent Vitals/Pain Pain Assessment Pain Assessment: No/denies pain    Home Living Family/patient expects to be discharged to:: Private residence Living Arrangements: Other (Comment) (hired care 24/7) Available Help at Discharge: Family;Personal care attendant;Available 24 hours/day Type of Home: House Home Access:  Stairs to enter Entrance Stairs-Rails:  (grab bar) Entrance Stairs-Number of Steps: 1   Home Layout: Two level;Able to live on main level with bedroom/bathroom Home Equipment: Rolling Walker (2 wheels);Cane - single point;BSC/3in1;Tub  bench      Prior Function Prior Level of Function : Needs assist             Mobility Comments: ambulates with SPC and minG of PCA ADLs Comments: requries assistance for bathing and IADLs, is able to dress and feed herself     Hand Dominance   Dominant Hand: Right    Extremity/Trunk Assessment   Upper Extremity Assessment Upper Extremity Assessment: Generalized weakness    Lower Extremity Assessment Lower Extremity Assessment: Generalized weakness    Cervical / Trunk Assessment Cervical / Trunk Assessment: Kyphotic  Communication   Communication: Expressive difficulties  Cognition Arousal/Alertness: Awake/alert Behavior During Therapy: Flat affect Overall Cognitive Status: Impaired/Different from baseline Area of Impairment: Orientation, Attention, Memory, Following commands, Safety/judgement, Awareness, Problem solving                 Orientation Level: Disoriented to, Place, Time, Situation Current Attention Level: Sustained Memory: Decreased recall of precautions, Decreased short-term memory Following Commands: Follows one step commands with increased time Safety/Judgement: Decreased awareness of safety, Decreased awareness of deficits Awareness: Intellectual Problem Solving: Slow processing, Decreased initiation, Difficulty sequencing, Requires verbal cues          General Comments General comments (skin integrity, edema, etc.): HR and SpO2 stable, pt with hypotension in sitting, 95/57, pt is symptomatic reporting dizziness. Last BP prior to mobility was 155/72. BP upon return to supine 137/96.    Exercises     Assessment/Plan    PT Assessment Patient needs continued PT services  PT Problem List Decreased strength;Decreased activity tolerance;Decreased balance;Decreased mobility;Decreased cognition;Decreased knowledge of use of DME;Decreased safety awareness;Decreased knowledge of precautions;Cardiopulmonary status limiting activity       PT  Treatment Interventions DME instruction;Gait training;Stair training;Functional mobility training;Therapeutic activities;Therapeutic exercise;Balance training;Neuromuscular re-education;Cognitive remediation;Patient/family education    PT Goals (Current goals can be found in the Care Plan section)  Acute Rehab PT Goals Patient Stated Goal: to improve mobility and cognition, return toward baseline or minA level PT Goal Formulation: With family Time For Goal Achievement: 10/25/22 Potential to Achieve Goals: Fair    Frequency Min 3X/week (attempt to progress toward return home)     Co-evaluation               AM-PAC PT "6 Clicks" Mobility  Outcome Measure Help needed turning from your back to your side while in a flat bed without using bedrails?: A Lot Help needed moving from lying on your back to sitting on the side of a flat bed without using bedrails?: A Lot Help needed moving to and from a bed to a chair (including a wheelchair)?: Total Help needed standing up from a chair using your arms (e.g., wheelchair or bedside chair)?: Total Help needed to walk in hospital room?: Total Help needed climbing 3-5 steps with a railing? : Total 6 Click Score: 8    End of Session   Activity Tolerance: Treatment limited secondary to medical complications (Comment) (hypotension) Patient left: in bed;with call bell/phone within reach;with bed alarm set;with family/visitor present Nurse Communication: Mobility status PT Visit Diagnosis: Other abnormalities of gait and mobility (R26.89);Muscle weakness (generalized) (M62.81)    Time: 1741-1810 PT Time Calculation (min) (ACUTE ONLY): 29 min   Charges:   PT Evaluation $PT Eval Low Complexity:  [place], DPT Acute Rehabilitation Office [phone]   Zenaida Niece 10/11/2022, 6:20 PM

## 2022-10-11 NOTE — Procedures (Signed)
Patient Name: [name]  MRN: [id number]  Epilepsy Attending: Lora Havens  Referring Physician/Provider: Lequita Halt, MD  Date: 10/11/2022 Duration: 22.14 mins  Patient history: 88yo F with ams. EEG to evaluate for seizure.  Level of alertness: Awake  AEDs during EEG study: GBP  Technical aspects: This EEG study was done with scalp electrodes positioned according to the 10-20 International system of electrode placement. Electrical activity was reviewed with band pass filter of 1-'70Hz'$ , sensitivity of 7 uV/mm, display speed of 21m/sec with a '60Hz'$  notched filter applied as appropriate. EEG data were recorded continuously and digitally stored.  Video monitoring was available and reviewed as appropriate.  Description: The posterior dominant rhythm consists of 8-9 Hz activity of moderate voltage (25-35 uV) seen predominantly in posterior head regions, symmetric and reactive to eye opening and eye closing. EEG showed intermittent generalized 3 to 6 Hz theta-delta slowing. Hyperventilation and photic stimulation were not performed.     ABNORMALITY - Intermittent slow, generalized  IMPRESSION: This study is suggestive of mild diffuse encephalopathy, nonspecific etiology. No seizures or epileptiform discharges were seen throughout the recording.  [name] OBarbra Sarks

## 2022-10-11 NOTE — H&P (Signed)
History and Physical    [name] BJY:[id number] DOB: [birth year] DOA: 10/10/2022  PCP: Prince Solian, MD (Confirm with patient/family/NH records and if not entered, this has to be entered at [facility] point of entry) Patient coming from: Home  I have personally briefly reviewed patient's old medical records in [place]  Chief Complaint: After mentations  HPI: [name] is a 88 y.o. female with medical history significant of chronic A-fib on Coumadin, brain meningioma, recurrent UTIs, HTN, PVD, question of CHF, chronic pain on narcotics, brought in by family members for altered mentations.  Patient is confused unable to answer any further questions, all questions and history provided by family members at bedside.  Daughter reported that the patient recently had UTI treated about 2 weeks ago, but antibiotics was discontinued in the middle due to lack of urine culture evidence for UTI. "  But her urine looks so dark", and reportedly patient has had significant reduced p.o. intake including food and fluid for about 2 to 3 weeks and urine color very dark.  Patient has no complaint and was seen by urology on December 19, and office renal ultrasound negative for significant findings and patient was ordered for a outpatient CT urogram, which is not done yet.  Last week, patient sustained a severe right eye infection and was seen by ophthalmologist who started patient on p.o. doxycycline and 2 eyedrops antibiotics.  But patient mentation was at baseline at that point.  However, about 3 days ago, patient became confused, unable to read newspaper or watching TV shows as usual, and " gibberish during conversation" and " not even remembering her last name". But no nauseous vomiting diarrhea cough fever reported by family member.  Patient takes as needed tramadol and gabapentin for about 2 years for residual neural pain from shingles infection 2 years ago. ED Course: Afebrile, nontachycardic no  hypotension, nonhypoxic.  CT head showed 2.5 cm partially calcified mass in the right frontal region most likely meningioma, there is a surrounding vasogenic edema appears to be more prominent compared to image study in July 2022.  UA showed RBC> 50, WBC 11-20  Neurology consulted recommended brain MRI.  Review of Systems: Unable to perform, patient confused.  Past Medical History:  Diagnosis Date   A-fib ([place])    On chronic anticoagulation   Allergy    Dysrhythmia    hx of atrial fibrilation   GERD (gastroesophageal reflux disease)    Headache(784.0)    Hyperlipidemia    Hypertension    Osteoporosis    Peripheral vascular disease (H[facility])    Pneumonia    PONV (postoperative nausea and vomiting)     Past Surgical History:  Procedure Laterality Date   BUNIONECTOMY     CARDIAC ELECTROPHYSIOLOGY MAPPING AND ABLATION     FRACTURE SURGERY     HEMIARTHROPLASTY HIP Right 04/08/2014   HIP ARTHROPLASTY Right 04/08/2014   Procedure: ARTHROPLASTY BIPOLAR HIP;  Surgeon: Mcarthur Rossetti, MD;  Location: [facility];  Service: Orthopedics;  Laterality: Right;     reports that she quit smoking about 72 years ago. Her smoking use included cigarettes. She has a 2.00 pack-year smoking history. She has quit using smokeless tobacco. She reports current alcohol use. She reports that she does not use drugs.  Allergies  Allergen Reactions   Contrast Media [Iodinated Contrast Media] Anaphylaxis   Dronedarone Other (See Comments)    CHF   Other Other (See Comments)    She is allergic to MRI  contrast media per daughter Pt states she is allergic to heart medications, can not recall names/ steroid injections ivp dye. ivp dye, reported MRI contrast   Amiodarone     Pulmonary edema   Flecainide     Parkinson like raction   Latex Itching and Swelling   Multaq [Dronedarone Hydrochloride] Swelling   Sulfa Antibiotics Other (See Comments)    Makes her sicker & it causes her to have dark rings around  her eyes    Family History  Problem Relation Age of Onset   Aneurysm Mother    Tuberculosis Father      Prior to Admission medications   Medication Sig Start Date End Date Taking? Authorizing Provider  amLODipine (NORVASC) 2.5 MG tablet TAKE 1 TABLET BY MOUTH EVERY DAY 12/17/21  Yes Adrian Prows, MD  doxycycline (ADOXA) 50 MG tablet Take 50 mg by mouth daily.   Yes [provider]  fluorometholone (FML) 0.1 % ophthalmic ointment Place 1 Application into both eyes 4 (four) times daily.   Yes [provider]  gabapentin (NEURONTIN) 100 MG capsule Take 200 mg by mouth 3 (three) times daily. 07/01/21  Yes [provider]  Melatonin 5 MG/ML LIQD Take 5 mg by mouth at bedtime.   Yes [provider]  methenamine (HIPREX) 1 g tablet Take 1 g by mouth 2 (two) times daily with a meal.   Yes [provider]  ofloxacin (OCUFLOX) 0.3 % ophthalmic solution Place 1 drop into both eyes 4 (four) times daily.   Yes [provider]  pravastatin (PRAVACHOL) 40 MG tablet Take 40 mg by mouth daily.   Yes [provider]  venlafaxine XR (EFFEXOR-XR) 75 MG 24 hr capsule TAKE 1 CAPSULE BY MOUTH DAILY WITH BREAKFAST. 10/07/21  Yes Adrian Prows, MD  Vitamin D, Ergocalciferol, (DRISDOL) 50000 UNITS CAPS capsule Take 50,000 Units by mouth every 7 (seven) days. Wednesday   Yes [provider]  warfarin (COUMADIN) 2.5 MG tablet Take 1 tablet (2.5 mg total) by mouth daily at 4 PM. As directed by your MD 05/28/22  Yes Adrian Prows, MD  acetaminophen (TYLENOL) 500 MG tablet Take 500 mg by mouth every 6 (six) hours as needed for mild pain.    [provider]  clotrimazole-betamethasone (LOTRISONE) cream Apply to affected area 2 times daily prn 05/09/21   Orpah Greek, MD  EPINEPHrine 0.3 mg/0.3 mL IJ SOAJ injection Inject into the muscle. 04/30/18   [provider]  fexofenadine (ALLEGRA) 180 MG tablet Take 180 mg by mouth daily as  needed.     [provider]  KLOR-CON M10 10 MEQ tablet Take 10 mEq by mouth 2 (two) times daily. 06/27/21   [provider]  LAGEVRIO 200 MG CAPS capsule SMARTSIG:4 Capsule(s) By Mouth Every 12 Hours 08/11/21   [provider]  nitrofurantoin, macrocrystal-monohydrate, (MACROBID) 100 MG capsule Take 100 mg by mouth every 12 (twelve) hours. 08/10/21   [provider]  ondansetron (ZOFRAN) 4 MG tablet Take 4 mg by mouth every 6 (six) hours as needed. 06/04/21   [provider]  potassium chloride (KLOR-CON) 10 MEQ tablet Take 1 tablet (10 mEq total) by mouth 2 (two) times daily. 12/17/20   Adrian Prows, MD  sotalol (BETAPACE) 120 MG tablet TAKE 1&1/2 TABLETS BY MOUTH 2 TIMES DAILY. Patient taking differently: 120 mg 2 (two) times daily. 120 am and 120 pm 05/28/22   Adrian Prows, MD  traMADol (ULTRAM) 50 MG tablet Take 50 mg  by mouth every 8 (eight) hours as needed. for pain 02/16/19   [provider]    Physical Exam: Vitals:   10/11/22 0800 10/11/22 0957 10/11/22 1100 10/11/22 1200  BP: (!) 156/95 (!) 163/103 (!) 163/103 (!) 171/105  Pulse: 69 98  89  Resp: (!) 23 20  (!) 21  Temp:      TempSrc:      SpO2: 96% 91%  94%  Weight:      Height:        Constitutional: NAD, calm, comfortable Vitals:   10/11/22 0800 10/11/22 0957 10/11/22 1100 10/11/22 1200  BP: (!) 156/95 (!) 163/103 (!) 163/103 (!) 171/105  Pulse: 69 98  89  Resp: (!) 23 20  (!) 21  Temp:      TempSrc:      SpO2: 96% 91%  94%  Weight:      Height:       Eyes: PERRL, lids and conjunctivae normal, right eye lib swelling and rash ENMT: Mucous membranes are dry. Posterior pharynx clear of any exudate or lesions.Normal dentition.  Neck: normal, supple, no masses, no thyromegaly Respiratory: clear to auscultation bilaterally, no wheezing, no crackles. Normal respiratory effort. No accessory muscle use.  Cardiovascular: Regular rate and rhythm, no murmurs / rubs / gallops. No  extremity edema. 2+ pedal pulses. No carotid bruits.  Abdomen: no tenderness, no masses palpated. No hepatosplenomegaly. Bowel sounds positive.  Musculoskeletal: no clubbing / cyanosis. No joint deformity upper and lower extremities. Good ROM, no contractures. Normal muscle tone.  Skin: no rashes, lesions, ulcers. No induration Neurologic: No facial droops, moving all limbs, following simple commands, B/L vision and visual field appear intact Psychiatric: Awake, confused.    Labs on Admission: I have personally reviewed following labs and imaging studies  CBC: Recent Labs  Lab 10/10/22 1057  WBC 6.3  HGB 13.8  HCT 43.7  MCV 95.6  PLT 619   Basic Metabolic Panel: Recent Labs  Lab 10/10/22 1057  NA 140  K 4.3  CL 103  CO2 27  GLUCOSE 143*  BUN 17  CREATININE 0.65  CALCIUM 9.2   GFR: Estimated Creatinine Clearance: 47.7 mL/min (by C-G formula based on SCr of 0.65 mg/dL). Liver Function Tests: Recent Labs  Lab 10/10/22 1057  AST 15  ALT 11  ALKPHOS 46  BILITOT 0.8  PROT 7.2  ALBUMIN 4.1   No results for input(s): "LIPASE", "AMYLASE" in the last 168 hours. No results for input(s): "AMMONIA" in the last 168 hours. Coagulation Profile: Recent Labs  Lab 10/10/22 1057 10/11/22 0539  INR 1.8* 1.6*   Cardiac Enzymes: No results for input(s): "CKTOTAL", "CKMB", "CKMBINDEX", "TROPONINI" in the last 168 hours. BNP (last 3 results) No results for input(s): "PROBNP" in the last 8760 hours. HbA1C: No results for input(s): "HGBA1C" in the last 72 hours. CBG: No results for input(s): "GLUCAP" in the last 168 hours. Lipid Profile: No results for input(s): "CHOL", "HDL", "LDLCALC", "TRIG", "CHOLHDL", "LDLDIRECT" in the last 72 hours. Thyroid Function Tests: No results for input(s): "TSH", "T4TOTAL", "FREET4", "T3FREE", "THYROIDAB" in the last 72 hours. Anemia Panel: No results for input(s): "VITAMINB12", "FOLATE", "FERRITIN", "TIBC", "IRON", "RETICCTPCT" in the last 72  hours. Urine analysis:    Component Value Date/Time   COLORURINE YELLOW 10/10/2022 1057   APPEARANCEUR CLEAR 10/10/2022 1057   LABSPEC 1.021 10/10/2022 1057   PHURINE 6.5 10/10/2022 1057   GLUCOSEU NEGATIVE 10/10/2022 1057   HGBUR LARGE (A) 10/10/2022 1057   BILIRUBINUR NEGATIVE  10/10/2022 1057   [place] 10/10/2022 1057   PROTEINUR 30 (A) 10/10/2022 1057   UROBILINOGEN 0.2 04/08/2014 1110   NITRITE NEGATIVE 10/10/2022 1057   LEUKOCYTESUR NEGATIVE 10/10/2022 1057    Radiological Exams on Admission: CT HEAD WO CONTRAST  Result Date: 10/10/2022 CLINICAL DATA:  Altered mental status EXAM: CT HEAD WITHOUT CONTRAST TECHNIQUE: Contiguous axial images were obtained from the base of the skull through the vertex without intravenous contrast. RADIATION DOSE REDUCTION: This exam was performed according to the departmental dose-optimization program which includes automated exposure control, adjustment of the mA and/or kV according to patient size and/or use of iterative reconstruction technique. COMPARISON:  Previous studies including the CT done on 06-07-21 FINDINGS: Brain: These 2.5 x 2.3 cm smooth marginated partly calcified mass inseparable from the meninges in the right frontal region with surrounding vasogenic edema. This lesion has been seen in multiple previous studies dating as far back as 01/08/2007. Vasogenic edema around the lesion is slightly more prominent in the current study. There are no signs of bleeding within the cranium. Cortical sulci are prominent. There is no significant shift of midline structures. Ventricles are unremarkable. Vascular: Unremarkable. Skull: Unremarkable. Sinuses/Orbits: There is almost complete opacification in frontal, ethmoid and sphenoid sinuses. There residual worsening of this finding since 07-Jun-2021. Other: None. IMPRESSION: There is 2.5 cm partly calcified mass in the right frontal region, most likely meningioma. There is vasogenic edema around  this lesion which appears slightly more prominent in comparison with the immediate previous examination of 06-07-2021. There are no signs of bleeding within the cranium. Atrophy. Small-vessel disease. Electronically Signed   By: Elmer Picker M.D.   On: 10/10/2022 14:07   DG Chest 1 View  Result Date: 10/10/2022 CLINICAL DATA:  Cough. EXAM: CHEST  1 VIEW COMPARISON:  08/11/2021 FINDINGS: Enlarged cardiac silhouette with a mild increase in size. Tortuous and partially calcified thoracic aorta. Possible hiatal hernia. Clear lungs. Mildly prominent pulmonary vasculature. Unremarkable bones. IMPRESSION: 1. Mild cardiomegaly and mild pulmonary vascular congestion. 2. Possible hiatal hernia. Electronically Signed   By: Claudie Revering M.D.   On: 10/10/2022 13:58    EKG: Independently reviewed.  Sinus arrhythmia, no acute ST changes.  Assessment/Plan Principal Problem:   AMS (altered mental status) Active Problems:   A-fib [facility])   Spinal stenosis  (please populate well all problems here in Problem List. (For example, if patient is on BP meds at home and you resume or decide to hold them, it is a problem that needs to be her. Same for CAD, COPD, HLD and so on)  Acute encephalopathy -Unknown etiology -No focal neurodeficit, brain MRI ordered to evaluate right frontal lobe meningioma.  Will order EEG routine. Family reported no seizure-like activity, no urinary issue -UTI unlikely given the no significant pyuria or nitrite in the UA consistent with outpatient workup study results. -Only new medication is doxycycline and eyedrops, less likely to cause CNS effect -Right infection appears to be improving as per family and her patient appears to be intact  Brain meningioma -Brain MRI, EEG study  Right eye conjunctivitis -Continue eyedrops -Continue doxycycline for total 30 days  Hx of CHF -X-ray shows mild cardiomegaly and pulmonary congestion, clinically patient has no hypoxia breathing room  air blood pressure significant elevated. -Clinically patient appears to be volume contracted due to poor oral intake -Hold off Lasix -Check echocardiogram -BP control  HTN -With elevated blood pressure -Increase amlodipine dosage  PAF -Incentive system, continue sotalol -Pharmacy to dose Coumadin  Hx of CHF -Clinically appears to be volume contraction dehydration, slow hydration x 12 hours reevaluate volume status and chest x-ray tomorrow morning. -Echocardiogram  Chronic neuralgia -Continue home dose of as needed tramadol and gabapentin  Moderate protein calorie malnutrition -With significant decreased oral intake recently -Check TSH -Ordered speech evaluation and nutrition evaluation for calorie count  Hematuria -Subacute-chronic, given there is no significant symptoms and with recent normal renal and bladder ultrasound, recommend outpatient CT urogram.  Family agreed. -UTI unlikely.  DVT prophylaxis: Coumadine Code Status: DNR (No intubation, no chest compression) Family Communication: Daughter/POA at bedside Disposition Plan: Expect less than 2 midnight hospital stay Consults called: Neurology Admission status: Tele obs   Lequita Halt MD [facility] Pager [phone]  10/11/2022, 12:17 PM

## 2022-10-11 NOTE — Progress Notes (Signed)
EEG complete - results pending 

## 2022-10-11 NOTE — ED Notes (Signed)
Pt appears to be comfortable and resting, can observe even RR that are unlabored, pt remains on cardiac monitoring devices, no changes noted, side rails up x2 for safety, NAD noted, plan of care ongoing, call light within reach, no further concerns as of present.  Care giver at the bedside in recliner, reports no needs

## 2022-10-12 ENCOUNTER — Inpatient Hospital Stay (HOSPITAL_COMMUNITY): Payer: Medicare Other

## 2022-10-12 ENCOUNTER — Observation Stay (HOSPITAL_COMMUNITY): Payer: Medicare Other

## 2022-10-12 DIAGNOSIS — D329 Benign neoplasm of meninges, unspecified: Secondary | ICD-10-CM | POA: Diagnosis not present

## 2022-10-12 DIAGNOSIS — I5032 Chronic diastolic (congestive) heart failure: Secondary | ICD-10-CM | POA: Diagnosis present

## 2022-10-12 DIAGNOSIS — Z91040 Latex allergy status: Secondary | ICD-10-CM | POA: Diagnosis not present

## 2022-10-12 DIAGNOSIS — Z881 Allergy status to other antibiotic agents status: Secondary | ICD-10-CM | POA: Diagnosis not present

## 2022-10-12 DIAGNOSIS — Z888 Allergy status to other drugs, medicaments and biological substances status: Secondary | ICD-10-CM | POA: Diagnosis not present

## 2022-10-12 DIAGNOSIS — J324 Chronic pansinusitis: Secondary | ICD-10-CM | POA: Diagnosis present

## 2022-10-12 DIAGNOSIS — I480 Paroxysmal atrial fibrillation: Secondary | ICD-10-CM | POA: Diagnosis present

## 2022-10-12 DIAGNOSIS — E440 Moderate protein-calorie malnutrition: Secondary | ICD-10-CM | POA: Diagnosis present

## 2022-10-12 DIAGNOSIS — F0390 Unspecified dementia without behavioral disturbance: Secondary | ICD-10-CM | POA: Diagnosis present

## 2022-10-12 DIAGNOSIS — Z91041 Radiographic dye allergy status: Secondary | ICD-10-CM | POA: Diagnosis not present

## 2022-10-12 DIAGNOSIS — I10 Essential (primary) hypertension: Secondary | ICD-10-CM | POA: Diagnosis not present

## 2022-10-12 DIAGNOSIS — I482 Chronic atrial fibrillation, unspecified: Secondary | ICD-10-CM | POA: Diagnosis present

## 2022-10-12 DIAGNOSIS — Z1152 Encounter for screening for COVID-19: Secondary | ICD-10-CM | POA: Diagnosis not present

## 2022-10-12 DIAGNOSIS — Z6826 Body mass index (BMI) 26.0-26.9, adult: Secondary | ICD-10-CM | POA: Diagnosis not present

## 2022-10-12 DIAGNOSIS — I739 Peripheral vascular disease, unspecified: Secondary | ICD-10-CM | POA: Diagnosis present

## 2022-10-12 DIAGNOSIS — I110 Hypertensive heart disease with heart failure: Secondary | ICD-10-CM | POA: Diagnosis present

## 2022-10-12 DIAGNOSIS — H109 Unspecified conjunctivitis: Secondary | ICD-10-CM | POA: Diagnosis present

## 2022-10-12 DIAGNOSIS — G936 Cerebral edema: Secondary | ICD-10-CM | POA: Diagnosis present

## 2022-10-12 DIAGNOSIS — E785 Hyperlipidemia, unspecified: Secondary | ICD-10-CM | POA: Diagnosis present

## 2022-10-12 DIAGNOSIS — Z87891 Personal history of nicotine dependence: Secondary | ICD-10-CM | POA: Diagnosis not present

## 2022-10-12 DIAGNOSIS — D320 Benign neoplasm of cerebral meninges: Secondary | ICD-10-CM | POA: Diagnosis present

## 2022-10-12 DIAGNOSIS — Z66 Do not resuscitate: Secondary | ICD-10-CM | POA: Diagnosis present

## 2022-10-12 DIAGNOSIS — R4182 Altered mental status, unspecified: Secondary | ICD-10-CM | POA: Diagnosis present

## 2022-10-12 DIAGNOSIS — G8929 Other chronic pain: Secondary | ICD-10-CM | POA: Diagnosis present

## 2022-10-12 DIAGNOSIS — R4701 Aphasia: Secondary | ICD-10-CM | POA: Diagnosis present

## 2022-10-12 DIAGNOSIS — G9341 Metabolic encephalopathy: Secondary | ICD-10-CM | POA: Diagnosis present

## 2022-10-12 DIAGNOSIS — Z882 Allergy status to sulfonamides status: Secondary | ICD-10-CM | POA: Diagnosis not present

## 2022-10-12 LAB — CBC
HCT: 41.8 % (ref 36.0–46.0)
Hemoglobin: 14.1 g/dL (ref 12.0–15.0)
MCH: 30.9 pg (ref 26.0–34.0)
MCHC: 33.7 g/dL (ref 30.0–36.0)
MCV: 91.7 fL (ref 80.0–100.0)
Platelets: 168 10*3/uL (ref 150–400)
RBC: 4.56 MIL/uL (ref 3.87–5.11)
RDW: 13.7 % (ref 11.5–15.5)
WBC: 5.2 10*3/uL (ref 4.0–10.5)
nRBC: 0 % (ref 0.0–0.2)

## 2022-10-12 LAB — BASIC METABOLIC PANEL
Anion gap: 14 (ref 5–15)
BUN: 18 mg/dL (ref 8–23)
CO2: 21 mmol/L — ABNORMAL LOW (ref 22–32)
Calcium: 8.8 mg/dL — ABNORMAL LOW (ref 8.9–10.3)
Chloride: 100 mmol/L (ref 98–111)
Creatinine, Ser: 0.67 mg/dL (ref 0.44–1.00)
GFR, Estimated: >60 mL/min (ref >60)
Glucose, Bld: 168 mg/dL — ABNORMAL HIGH (ref 70–99)
Potassium: 3.5 mmol/L (ref 3.5–5.1)
Sodium: 135 mmol/L (ref 135–145)

## 2022-10-12 LAB — ECHOCARDIOGRAM COMPLETE
Area-P 1/2: 3.42 cm2
Height: 67 in
MV M vel: 4.13 m/s
MV Peak grad: 68.2 mmHg
P 1/2 time: 698 msec
S' Lateral: 2.4 cm
Weight: 2688 oz

## 2022-10-12 LAB — PROTIME-INR
INR: 1.9 — ABNORMAL HIGH (ref 0.8–1.2)
Prothrombin Time: 21.4 seconds — ABNORMAL HIGH (ref 11.4–15.2)

## 2022-10-12 MED ORDER — ENSURE ENLIVE PO LIQD: 237.0000 mL | Freq: Two times a day (BID) | ORAL | Status: DC

## 2022-10-12 MED ORDER — FLUOROMETHOLONE 0.1 % OP OINT
1.0000 | TOPICAL_OINTMENT | Freq: Four times a day (QID) | OPHTHALMIC | Status: DC
  Filled 2022-10-12 (×2): qty 3.5

## 2022-10-12 MED ORDER — CYANOCOBALAMIN 1000 MCG/ML IJ SOLN
1000.0000 ug | Freq: Every day | INTRAMUSCULAR | Status: AC
  Administered 2022-10-12 – 2022-10-14 (×3): 1000 ug via INTRAMUSCULAR
  Filled 2022-10-12 (×3): qty 1

## 2022-10-12 MED ORDER — PANTOPRAZOLE SODIUM 40 MG PO TBEC
40.0000 mg | DELAYED_RELEASE_TABLET | Freq: Every day | ORAL | Status: DC
  Administered 2022-10-12 – 2022-10-14 (×3): 40 mg via ORAL
  Filled 2022-10-12 (×3): qty 1

## 2022-10-12 MED ORDER — WARFARIN SODIUM 2.5 MG PO TABS
2.5000 mg | ORAL_TABLET | Freq: Once | ORAL | Status: AC
  Administered 2022-10-12: 2.5 mg via ORAL
  Filled 2022-10-12: qty 1

## 2022-10-12 MED ORDER — DEXAMETHASONE SODIUM PHOSPHATE 4 MG/ML IJ SOLN
4.0000 mg | Freq: Four times a day (QID) | INTRAMUSCULAR | Status: DC
  Administered 2022-10-12 – 2022-10-13 (×4): 4 mg via INTRAVENOUS
  Filled 2022-10-12 (×4): qty 1

## 2022-10-12 MED ORDER — DOXYCYCLINE HYCLATE 100 MG PO TABS
100.0000 mg | ORAL_TABLET | Freq: Two times a day (BID) | ORAL | Status: DC
  Administered 2022-10-12 – 2022-10-14 (×4): 100 mg via ORAL
  Filled 2022-10-12 (×4): qty 1

## 2022-10-12 MED ORDER — CYANOCOBALAMIN 1000 MCG/ML IJ SOLN: 1000.0000 ug | INTRAMUSCULAR | Status: DC

## 2022-10-12 MED ORDER — FLUOROMETHOLONE 0.1 % OP SUSP
1.0000 [drp] | Freq: Four times a day (QID) | OPHTHALMIC | Status: DC
  Administered 2022-10-12 – 2022-10-14 (×8): 1 [drp] via OPHTHALMIC
  Filled 2022-10-12: qty 5

## 2022-10-12 MED ORDER — ADULT MULTIVITAMIN W/MINERALS CH
1.0000 | ORAL_TABLET | Freq: Every day | ORAL | Status: DC
  Administered 2022-10-12 – 2022-10-14 (×3): 1 via ORAL
  Filled 2022-10-12 (×3): qty 1

## 2022-10-12 NOTE — TOC Initial Note (Signed)
Transition of Care [facility]) - Initial/Assessment Note    Patient Details  Name: [name] MRN: [id number] Date of Birth: 02-17-1928  Transition of Care [facility]) CM/SW Contact:    Bethann Berkshire, [place] Phone Number: 10/12/2022, 11:49 AM  Clinical Narrative:                  CSW called pt's daughter to discuss SNF rec. Daughter explains that she would like pt to come home at discharge with [facility] PT/OT. Family has made arrangements for pt to have 24/7 care for the past year. Daughter is a Therapist, sports. She feels pt would do better at home as she is comfortable environment. Pt has a 3in1, shower chair, walker, cane, and a bed alarm. Daughter reports preference for [facility]. CSW notified MD and RNCM.   Shortly after call CSW receives call from daughter explaining that she spoke with family and they want to consider SNF options. Agreeable to SNF workup though daughter indicates they are leaning towards home with [facility]. CSW will complete fl2 and fax bed requests in hub.   Expected Discharge Plan: [place] Barriers to Discharge: Continued Medical Work up   Patient Goals and CMS Choice            Expected Discharge Plan and Services       Living arrangements for the past 2 months: Single Family Home                                      Prior Living Arrangements/Services Living arrangements for the past 2 months: Single Family Home Lives with:: Self, Other (Comment) (24/7 caregivers) Patient language and need for interpreter reviewed:: Yes        Need for Family Participation in Patient Care: Yes (Comment) Care giver support system in place?: Yes (comment) Current home services: Homehealth aide Criminal Activity/Legal Involvement Pertinent to Current Situation/Hospitalization: No - Comment as needed  Activities of Daily Living Home Assistive Devices/Equipment: Eyeglasses, Cane (specify quad or straight), Grab bars around toilet, Grab bars in shower, Shower chair with  back ADL Screening (condition at time of admission) Patient's cognitive ability adequate to safely complete daily activities?: No Is the patient deaf or have difficulty hearing?: No Does the patient have difficulty seeing, even when wearing glasses/contacts?: No Does the patient have difficulty concentrating, remembering, or making decisions?: Yes Patient able to express need for assistance with ADLs?: No Does the patient have difficulty dressing or bathing?: Yes Independently performs ADLs?: No Communication: Independent Dressing (OT): Needs assistance Is this a change from baseline?: Pre-admission baseline Grooming: Needs assistance Is this a change from baseline?: Pre-admission baseline Feeding: Independent Bathing: Needs assistance Is this a change from baseline?: Pre-admission baseline Toileting: Needs assistance Is this a change from baseline?: Pre-admission baseline In/Out Bed: Independent Walks in Home: Needs assistance Is this a change from baseline?: Change from baseline, expected to last >3 days Does the patient have difficulty walking or climbing stairs?: Yes Weakness of Legs: Both Weakness of Arms/Hands: Both  Permission Sought/Granted                  Emotional Assessment       Orientation: : Oriented to Self Alcohol / Substance Use: Not Applicable Psych Involvement: No (comment)  Admission diagnosis:  Altered mental status, unspecified altered mental status type [R41.82] AMS (altered mental status) [R41.82] Patient Active Problem List  Diagnosis Date Noted   AMS (altered mental status) 10/10/2022   Amnesia 07/10/2021   Bacterial conjunctivitis of both eyes 07/10/2021   Postherpetic neuralgia 07/10/2021   Vitamin D deficiency 07/10/2021   Contusion of left knee 04/16/2020   Disequilibrium 06/15/2019   Weakness 06/15/2019   Hypertensive heart failure ([place]) 02/14/2019   Recurrent falls 02/14/2019   Diarrhea 10/25/2018   Acute bronchitis 05/03/2018    Urinary tract infectious disease 04/27/2018   Arthritis of both knees 02/02/2018   Frequency of micturition 10/27/2017   Non-pressure chronic ulcer of other part of right foot limited to breakdown of skin ([place]) 07/29/2017   Anemia 01/29/2017   Benign neoplasm of meninges ([place]) 09/14/2016   Varicose veins of right lower extremity with complications 28/31/5176   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[place]) 01/22/2016   Non-thrombocytopenic purpura ([place]) 01/22/2016   Impaired fasting glucose 01/15/2016   Pain in left knee 07/01/2015   Localized, primary osteoarthritis of hand 10/16/2014   Acute blood loss anemia 04/12/2014   Allergic rhinitis 04/12/2014   Depression 04/12/2014   Aspiration pneumonia ([place]) 04/10/2014   Hip fracture ([place]) 04/08/2014   Fracture of femoral neck, right ([place]) 04/08/2014   Hyperlipidemia    Osteoporosis    Hypertension    A-fib ([place])    Cellulitis of left lower limb 09/18/2013   Peripheral venous insufficiency 03/22/2012   Gastro-esophageal reflux disease without esophagitis 09/30/2011   Allergy to IVP dye 09/29/2011   Spinal stenosis 07/30/2011   Non-refractory chronic migraine without aura 08/23/2009   PCP:  Prince Solian, MD Pharmacy:   [id number] [place] [facility] [address]Phone: [phone]: [phone]    Social Determinants of Health (SDOH) Social History: SDOH Screenings   Tobacco Use: Medium Risk (10/10/2022)   SDOH Interventions:     Readmission Risk Interventions     No data to display

## 2022-10-12 NOTE — Progress Notes (Signed)
  Echocardiogram 2D Echocardiogram has been performed.  [name] 10/12/2022, 10:35 AM

## 2022-10-12 NOTE — Progress Notes (Signed)
Initial Nutrition Assessment  DOCUMENTATION CODES:   Not applicable  INTERVENTION:  - Add Ensure Enlive po BID, each supplement provides 350 kcal and 20 grams of protein.  - Add MVI q day.   NUTRITION DIAGNOSIS:   Inadequate oral intake related to lethargy/confusion, poor appetite as evidenced by meal completion < 50%.  GOAL:   Patient will meet greater than or equal to 90% of their needs  MONITOR:   PO intake, Supplement acceptance  REASON FOR ASSESSMENT:   Consult Assessment of nutrition requirement/status  ASSESSMENT:   89 y.o. female admits related to AMS. PMH includes: afib, brain meningioma, recurrent UTIs, HTN, PVD.  Meds reviewed: risaquad, decadron, klor-con, coumadin, warfarin. Labs reviewed.   Pt is currently oriented x1. No intakes per record. RN reports that the pt did not do well with her breakfast tray this am. No significant wt loss per record. RD will add supplements and continue to monitor PO intakes.   FOCUSED PHYSICAL EXAM:  Unable to assess, attempt at f/u.   Diet Order:   Diet Order             Diet regular Room service appropriate? Yes; Fluid consistency: Thin  Diet effective now                   EDUCATION NEEDS:   Not appropriate for education at this time  Skin:  Skin Assessment: Reviewed RN Assessment  Last BM:  10/09/22  Height:   Ht Readings from Last 1 Encounters:  10/10/22 '5\' 7"'$  (1.702 m)    Weight:   Wt Readings from Last 1 Encounters:  10/10/22 76.2 kg    Ideal Body Weight:     BMI:  Body mass index is 26.31 kg/m.  Estimated Nutritional Needs:   Kcal:  2081-3887 kcals  Protein:  95-115 gm  Fluid:  >/= 1.9 L  Thalia Bloodgood, RD, LDN, CNSC.

## 2022-10-12 NOTE — Progress Notes (Signed)
  Transition of Care [facility]) Screening Note   Patient Details  Name: [name] Date of Birth: Dec 21, 1927   Transition of Care [facility]) CM/SW Contact:    Cyndi Bender, RN Phone Number: 10/12/2022, 9:18 AM    Transition of Care Department [facility]) has reviewed patient and no TOC needs have been identified at this time. We will continue to monitor patient advancement through interdisciplinary progression rounds. If new patient transition needs arise, please place a TOC consult.

## 2022-10-12 NOTE — Code Documentation (Signed)
Per pt's daughter:  Pt is to be a DO NOT INTUBATE and DO NOT PERFORM CPR code status. Do everything else. Pt is NOT a true DNR.

## 2022-10-12 NOTE — Evaluation (Signed)
Occupational Therapy Evaluation Patient Details Name: [name] MRN: [id number] DOB: 1927-12-08 Today's Date: 10/12/2022   History of Present Illness 89 y.o. female presents to [facility] hospital on 10/10/2022 with AMS. CT head demonstrates R frontal mass with more prominent vasogenic edema than in previous studies. PMH includes afib, GERD, HA, HTN, PVD, PNA.   Clinical Impression   PTA pt lives at home and has pesonal caregivers. At baseline she is ambulatory with a cane and has assist as needed for ADL tasks. Pt is active in the garden club and enjoys reading the paper. Daughter present and plan is for pt to return home. Eval limited by MD visit. Recommend 24/7 assistance at [place] with [place] [facility] aide. Acute OT to follow to facilitate safe DC home.       Recommendations for follow up therapy are one component of a multi-disciplinary discharge planning process, led by the attending physician.  Recommendations may be updated based on patient status, additional functional criteria and insurance authorization.   Follow Up Recommendations  Home health OT     Assistance Recommended at Discharge Frequent or constant Supervision/Assistance  Patient can return home with the following Two people to help with walking and/or transfers;Two people to help with bathing/dressing/bathroom;Assistance with feeding;Direct supervision/assist for medications management;Direct supervision/assist for financial management;Assistance with cooking/housework;Assist for transportation;Help with stairs or ramp for entrance    Functional Status Assessment  Patient has had a recent decline in their functional status and demonstrates the ability to make significant improvements in function in a reasonable and predictable amount of time.  Equipment Recommendations  Wheelchair (measurements OT);Wheelchair cushion (measurements OT) (? hoyer pending mobility)    Recommendations for Other Services       Precautions /  Restrictions Precautions Precautions: Fall Precaution Comments: orthostatic supine to sitting Restrictions Weight Bearing Restrictions: No      Mobility Bed Mobility Overal bed mobility: Needs Assistance Bed Mobility: Supine to Sit, Sit to Supine     Supine to sit: Max assist, HOB elevated Sit to supine: Max assist, HOB elevated   General bed mobility comments: per PT note - eval limited    Transfers Overall transfer level:  (deferred 2/2 hypotension)                        Balance                                           ADL either performed or assessed with clinical judgement   ADL                                               Vision Baseline Vision/History: 1 Wears glasses;6 Macular Degeneration (reading) Additional Comments: recent eye infection     Perception     Praxis      Pertinent Vitals/Pain Pain Assessment Pain Assessment: Faces Faces Pain Scale: Hurts little more Pain Location: discomfort, especially with eye infection - discomfort with trying to open eyes     Hand Dominance Right   Extremity/Trunk Assessment Upper Extremity Assessment Upper Extremity Assessment: Generalized weakness   Lower Extremity Assessment Lower Extremity Assessment: Generalized weakness   Cervical / Trunk Assessment Cervical / Trunk Assessment: Kyphotic   Communication Communication  Communication: Expressive difficulties   Cognition Arousal/Alertness: Awake/alert Behavior During Therapy: Flat affect Overall Cognitive Status: Impaired/Different from baseline Area of Impairment: Orientation, Attention, Memory, Following commands, Safety/judgement, Awareness, Problem solving                 Orientation Level: Disoriented to, Place, Time, Situation Current Attention Level: Focused Memory: Decreased recall of precautions, Decreased short-term memory Following Commands: Follows one step commands with increased  time Safety/Judgement: Decreased awareness of safety, Decreased awareness of deficits Awareness: Intellectual Problem Solving: Slow processing, Decreased initiation, Difficulty sequencing, Requires verbal cues General Comments: daughter states gradual decline over the last year with memory however is normally able to complete everyday tasks, reads the papers and follows her daily routine; goes to garden club meetingsdaughter states AMD is significant change; able to say her first name but not her last; unable to state her birthday; perseverating; "figity" behavior     General Comments       Exercises     Shoulder Instructions      Home Living Family/patient expects to be discharged to:: Private residence Living Arrangements: Other (Comment) (hired care 24/7) Available Help at Discharge: Family;Personal care attendant;Available 24 hours/day Type of Home: House Home Access: Stairs to enter [employer] of Steps: 1 Entrance Stairs-Rails:  (grab bar) Home Layout: Two level;Able to live on main level with bedroom/bathroom     Bathroom Shower/Tub: Tub/shower unit;Walk-in shower   Bathroom Toilet: Standard     Home Equipment: Conservation officer, nature (2 wheels);Cane - single point;BSC/3in1;Tub bench          Prior Functioning/Environment Prior Level of Function : Needs assist             Mobility Comments: ambulates with SPC and minG of PCA ADLs Comments: requries assistance for bathing and IADLs, is able to dress and feed herself        OT Problem List: Decreased strength;Decreased activity tolerance;Impaired balance (sitting and/or standing);Decreased cognition;Decreased safety awareness;Decreased knowledge of use of DME or AE      OT Treatment/Interventions: Self-care/ADL training;Therapeutic exercise;DME and/or AE instruction;Therapeutic activities;Cognitive remediation/compensation;Visual/perceptual remediation/compensation;Patient/family education;Balance training     OT Goals(Current goals can be found in the care plan section) Acute Rehab OT Goals Patient Stated Goal: daughter - to take her mom home OT Goal Formulation: With family Time For Goal Achievement: 10/26/22 Potential to Achieve Goals: Fair  OT Frequency: Min 2X/week    Co-evaluation              AM-PAC OT "6 Clicks" Daily Activity     Outcome Measure Help from another person eating meals?: Total Help from another person taking care of personal grooming?: Total Help from another person toileting, which includes using toliet, bedpan, or urinal?: Total Help from another person bathing (including washing, rinsing, drying)?: Total Help from another person to put on and taking off regular upper body clothing?: Total Help from another person to put on and taking off regular lower body clothing?: Total 6 Click Score: 6   End of Session Nurse Communication: Mobility status  Activity Tolerance: Other (comment) (limited by MD visit) Patient left: in bed;with call bell/phone within reach;with family/visitor present;Other (comment) (MD)  OT Visit Diagnosis: Unsteadiness on feet (R26.81);Other abnormalities of gait and mobility (R26.89);Muscle weakness (generalized) (M62.81);Other symptoms and signs involving the nervous system (R29.898);Other symptoms and signs involving cognitive function                Time: 8466-5993 OT Time Calculation (min): 14 min Charges:  OT General Charges $OT Visit: 1 Visit OT Evaluation $OT Eval Moderate Complexity: [place], OT/L   Acute OT Clinical Specialist Acute Rehabilitation Services Pager [phone] Office [phone]   [facility] 10/12/2022, 9:17 AM

## 2022-10-12 NOTE — Progress Notes (Signed)
ANTICOAGULATION CONSULT NOTE - Follow Up Consult   Pharmacy Consult for warfarin  Indication: atrial fibrillation  Allergies  Allergen Reactions   Contrast Media [Iodinated Contrast Media] Anaphylaxis   Dronedarone Other (See Comments)    CHF   Other Other (See Comments)    She is allergic to MRI contrast media per daughter Pt states she is allergic to heart medications, can not recall names/ steroid injections ivp dye. ivp dye, reported MRI contrast   Amiodarone     Pulmonary edema   Flecainide     Parkinson like raction   Latex Itching and Swelling   Multaq [Dronedarone Hydrochloride] Swelling   Sulfa Antibiotics Other (See Comments)    Makes her sicker & it causes her to have dark rings around her eyes    Patient Measurements: Height: '5\' 7"'$  (170.2 cm) Weight: 76.2 kg (168 lb) IBW/kg (Calculated) : 61.6  Vital Signs: Temp: 97.9 F (36.6 C) (01/01 0309) Temp Source: Oral (01/01 0309) BP: 128/90 (01/01 0309) Pulse Rate: 78 (01/01 0309)  Labs: Recent Labs    10/10/22 1057 10/11/22 0539 10/12/22 0313  HGB 13.8  --  14.1  HCT 43.7  --  41.8  PLT 166  --  168  LABPROT 20.7* 18.4* 21.4*  INR 1.8* 1.6* 1.9*  CREATININE 0.65  --  0.67     Estimated Creatinine Clearance: 47.7 mL/min (by C-G formula based on SCr of 0.67 mg/dL).   Medical History: Past Medical History:  Diagnosis Date   A-fib ([place])    On chronic anticoagulation   Allergy    Dysrhythmia    hx of atrial fibrilation   GERD (gastroesophageal reflux disease)    Headache(784.0)    Hyperlipidemia    Hypertension    Osteoporosis    Peripheral vascular disease (H[facility])    Pneumonia    PONV (postoperative nausea and vomiting)    Assessment: Patient on warfarin PTA for hx of Afib. Per nursing regimen at home is 2.'5mg'$  every day based on discussion with daughter but that her regimen has been changed around frequently. Last dose on 12/29 was 2.'5mg'$ . INR 12/30 1.8. INR is 1.6 on 12/31.   10/12/22: INR  subtherapeutic but nearing goal today at 1.9. H&H and platelets normal, stable.   Goal of Therapy:  INR 2-3 Monitor platelets by anticoagulation protocol: Yes   Plan:  Will re-dose with home dose of 2.5 mg again  Repeat INR in AM for further dosing.   Daily CBC  Vicenta Dunning, PharmD  PGY1 Pharmacy Resident

## 2022-10-12 NOTE — Progress Notes (Addendum)
PROGRESS NOTE    [name]  TKW:[id number] DOB: 07/02/1928 DOA: 10/10/2022 PCP: Prince Solian, MD    Chief Complaint  Patient presents with   Weakness    Brief Narrative:   [name] is a 89 y.o. female with medical history significant of chronic A-fib on Coumadin, brain meningioma, recurrent UTIs, HTN, PVD, question of CHF, chronic pain on narcotics, brought in by family members for altered mentations.  She is with known history of meningioma, and recent treatment for UTI.   Assessment & Plan:    Acute encephalopathy -Think this is multifactorial, some underlying dementia, as well with known meningioma, which appears to be with worsening edema and mass effect, and possible pansinusitis. -EEG with no seizure activities -Remains altered, will monitor her progression on IV steroids and sinusitis treatment.   Brain meningioma -Brain MRI significant for slight increase in meningioma side, and worsening surrounding mass effect and vasogenic edema, neurosurgery input greatly appreciated, continue with steroids for another couple days and then taper after that   Right eye conjunctivitis -Continue eyedrops -Continue doxycycline for total 30 days  Pansinusitis -As evident in MRI brain, will continue with doxycycline which was started for her right eye conjunctivitis, but will increase dose to 100 mg oral twice daily.   Hx of CHF -X-ray shows mild cardiomegaly and pulmonary congestion, clinically patient has no hypoxia breathing room air blood pressure significant elevated. -Clinically patient appears to be volume contracted due to poor oral intake -Hold off Lasix -Check echocardiogram -BP control   HTN -With elevated blood pressure -Increase amlodipine dosage   PAF -Incentive system, continue sotalol -Pharmacy to dose Coumadin   Hx of CHF -Clinically appears to be volume contraction dehydration, slow hydration x 12 hours reevaluate volume status and chest x-ray  tomorrow morning. -Echocardiogram   Chronic neuralgia -Continue home dose of as needed tramadol and gabapentin   Moderate protein calorie malnutrition -With significant decreased oral intake recently -Check TSH -Ordered speech evaluation and nutrition evaluation for calorie count   Hematuria -Subacute-chronic, given there is no significant symptoms and with recent normal renal and bladder ultrasound. -Will check CT renal protocol  -UTI unlikely.     DVT prophylaxis: Warfarin Code Status: Full Family Communication: daughter and son at bedside Disposition:   Status is: She remains altered   Consultants:  Neurosurgery    Subjective:  This very poor historian, as discussed with daughter at bedside, no significant events, appetite is poor, she remains altered.  Objective: Vitals:   10/11/22 1927 10/12/22 0000 10/12/22 0309 10/12/22 0800  BP: (!) 135/109 130/85 (!) 128/90   Pulse: 92 91 78   Resp: '17 19 17   '$ Temp: 97.7 F (36.5 C) 97.8 F (36.6 C) 97.9 F (36.6 C)   TempSrc: Axillary Oral Oral   SpO2: 91% 91% 93% 93%  Weight:      Height:        Intake/Output Summary (Last 24 hours) at 10/12/2022 1416 Last data filed at 10/12/2022 0800 Gross per 24 hour  Intake 811.51 ml  Output --  Net 811.51 ml   Filed Weights   10/10/22 2008  Weight: 76.2 kg    Examination:  Frail female, sleepy, open eyes to verbal stimuli, only oriented x 1. Symmetrical Chest wall movement, Good air movement bilaterally, CTAB RRR,No Gallops,Rubs or new Murmurs, No Parasternal Heave +ve B.Sounds, Abd Soft, No tenderness, No rebound - guarding or rigidity. No Cyanosis, Clubbing or edema, No new Rash or bruise  Data Reviewed: I have personally reviewed following labs and imaging studies  CBC: Recent Labs  Lab 10/10/22 1057 10/12/22 0313  WBC 6.3 5.2  HGB 13.8 14.1  HCT 43.7 41.8  MCV 95.6 91.7  PLT 166 867    Basic Metabolic Panel: Recent Labs  Lab 10/10/22 1057  10/12/22 0313  NA 140 135  K 4.3 3.5  CL 103 100  CO2 27 21*  GLUCOSE 143* 168*  BUN 17 18  CREATININE 0.65 0.67  CALCIUM 9.2 8.8*    GFR: Estimated Creatinine Clearance: 47.7 mL/min (by C-G formula based on SCr of 0.67 mg/dL).  Liver Function Tests: Recent Labs  Lab 10/10/22 1057  AST 15  ALT 11  ALKPHOS 46  BILITOT 0.8  PROT 7.2  ALBUMIN 4.1    CBG: No results for input(s): "GLUCAP" in the last 168 hours.   Recent Results (from the past 240 hour(s))  Resp panel by RT-PCR (RSV, Flu A&B, Covid) Anterior Nasal Swab     Status: None   Collection Time: 10/10/22 10:57 AM   Specimen: Anterior Nasal Swab  Result Value Ref Range Status   SARS Coronavirus 2 by RT PCR NEGATIVE NEGATIVE Final    Comment: (NOTE) SARS-CoV-2 target nucleic acids are NOT DETECTED.  The SARS-CoV-2 RNA is generally detectable in upper respiratory specimens during the acute phase of infection. The lowest concentration of SARS-CoV-2 viral copies this assay can detect is 138 copies/mL. A negative result does not preclude SARS-Cov-2 infection and should not be used as the sole basis for treatment or other patient management decisions. A negative result may occur with  improper specimen collection/handling, submission of specimen other than nasopharyngeal swab, presence of viral mutation(s) within the areas targeted by this assay, and inadequate number of viral copies(<138 copies/mL). A negative result must be combined with clinical observations, patient history, and epidemiological information. The expected result is Negative.  Fact Sheet for Patients:  EntrepreneurPulse.com.au  Fact Sheet for Healthcare Providers:  IncredibleEmployment.be  This test is no t yet approved or cleared by the [place] FDA and  has been authorized for detection and/or diagnosis of SARS-CoV-2 by FDA under an Emergency Use Authorization (EUA). This EUA will remain  in effect  (meaning this test can be used) for the duration of the COVID-19 declaration under Section 564(b)(1) of the Act, 21 U.S.C.section 360bbb-3(b)(1), unless the authorization is terminated  or revoked sooner.       Influenza A by PCR NEGATIVE NEGATIVE Final   Influenza B by PCR NEGATIVE NEGATIVE Final    Comment: (NOTE) The Xpert Xpress SARS-CoV-2/FLU/RSV plus assay is intended as an aid in the diagnosis of influenza from Nasopharyngeal swab specimens and should not be used as a sole basis for treatment. Nasal washings and aspirates are unacceptable for Xpert Xpress SARS-CoV-2/FLU/RSV testing.  Fact Sheet for Patients: EntrepreneurPulse.com.au  Fact Sheet for Healthcare Providers: IncredibleEmployment.be  This test is not yet approved or cleared by the [place] FDA and has been authorized for detection and/or diagnosis of SARS-CoV-2 by FDA under an Emergency Use Authorization (EUA). This EUA will remain in effect (meaning this test can be used) for the duration of the COVID-19 declaration under Section 564(b)(1) of the Act, 21 U.S.C. section 360bbb-3(b)(1), unless the authorization is terminated or revoked.     Resp Syncytial Virus by PCR NEGATIVE NEGATIVE Final    Comment: (NOTE) Fact Sheet for Patients: EntrepreneurPulse.com.au  Fact Sheet for Healthcare Providers: IncredibleEmployment.be  This test is not yet approved  or cleared by the [place] and has been authorized for detection and/or diagnosis of SARS-CoV-2 by FDA under an Emergency Use Authorization (EUA). This EUA will remain in effect (meaning this test can be used) for the duration of the COVID-19 declaration under Section 564(b)(1) of the Act, 21 U.S.C. section 360bbb-3(b)(1), unless the authorization is terminated or revoked.  Performed at [facility], [address] [place], [place] [address]   Urine  Culture     Status: Abnormal (Preliminary result)   Collection Time: 10/10/22  1:23 PM   Specimen: Urine, Clean Catch  Result Value Ref Range Status   Specimen Description   Final    URINE, CLEAN CATCH Performed at [facility], [address] [place], [place] [address]    Special Requests   Final    NONE Performed at [facility], [place], [place] [address]    Culture (A)  Final    20,000 COLONIES/mL STREPTOCOCCUS GALLOLYTICUS SUSCEPTIBILITIES TO FOLLOW Performed at [facility], [place] [address] [place], [place] [address]    Report Status PENDING  Incomplete         Radiology Studies: ECHOCARDIOGRAM COMPLETE  Result Date: 10/12/2022    ECHOCARDIOGRAM REPORT   Patient Name:   TANDREA KOMMER Date of Exam: 10/12/2022 Medical Rec #:  [id number]    Height:       67.0 in Accession #:    [id number]   Weight:       168.0 lb Date of Birth:  16-Mar-1928    BSA:          1.878 m Patient Age:    89 years     BP:           134/96 mmHg Patient Gender: F            HR:           75 bpm. Exam Location:  Inpatient Procedure: 2D Echo, Color Doppler and Cardiac Doppler Indications:    CHF-Acute Diastolic K80.03  History:        Patient has no prior history of Echocardiogram examinations.                 AMS, Arrythmias:Atrial Fibrillation; Risk Factors:Dyslipidemia,                 Hypertension and Former Smoker.  Sonographer:    Greer Pickerel Referring Phys: [id number] Lequita Halt  Sonographer Comments: Technically challenging study due to limited acoustic windows. Image acquisition challenging due to patient body habitus and Image acquisition challenging due to respiratory motion. IMPRESSIONS  1. Left ventricular ejection fraction, by estimation, is 60 to 65%. Left ventricular ejection fraction by PLAX is 67 %. The left ventricle has normal function. The left ventricle has no regional wall motion abnormalities. There is mild left ventricular  hypertrophy. Left ventricular diastolic parameters are consistent with Grade III diastolic dysfunction (restrictive).  2. Right ventricular systolic function is normal. The right ventricular size is normal. There is normal pulmonary artery systolic pressure.  3. Left atrial size was severely dilated.  4. Right atrial size was mildly dilated.  5. The mitral valve is degenerative. Mild mitral valve regurgitation. No evidence of mitral stenosis.  6. The tricuspid valve is degenerative. Tricuspid valve regurgitation is moderate.  7. The aortic valve is tricuspid. Aortic valve regurgitation is moderate. Aortic valve sclerosis/calcification is present, without any evidence of aortic stenosis. FINDINGS  Left Ventricle: Left ventricular  ejection fraction, by estimation, is 60 to 65%. Left ventricular ejection fraction by PLAX is 67 %. The left ventricle has normal function. The left ventricle has no regional wall motion abnormalities. The left ventricular internal cavity size was normal in size. There is mild left ventricular hypertrophy. Left ventricular diastolic parameters are consistent with Grade III diastolic dysfunction (restrictive). Right Ventricle: The right ventricular size is normal. No increase in right ventricular wall thickness. Right ventricular systolic function is normal. There is normal pulmonary artery systolic pressure. The tricuspid regurgitant velocity is 2.81 m/s, and  with an assumed right atrial pressure of 3 mmHg, the estimated right ventricular systolic pressure is 58.8 mmHg. Left Atrium: Left atrial size was severely dilated. Right Atrium: Right atrial size was mildly dilated. Pericardium: There is no evidence of pericardial effusion. Mitral Valve: The mitral valve is degenerative in appearance. Mild mitral valve regurgitation. No evidence of mitral valve stenosis. Tricuspid Valve: The tricuspid valve is degenerative in appearance. Tricuspid valve regurgitation is moderate . No evidence of  tricuspid stenosis. Aortic Valve: The aortic valve is tricuspid. Aortic valve regurgitation is moderate. Aortic regurgitation PHT measures 698 msec. Aortic valve sclerosis/calcification is present, without any evidence of aortic stenosis. Pulmonic Valve: The pulmonic valve was grossly normal. Pulmonic valve regurgitation is mild. No evidence of pulmonic stenosis. Aorta: The aortic root is normal in size and structure. IAS/Shunts: No atrial level shunt detected by color flow Doppler.  LEFT VENTRICLE PLAX 2D LV EF:         Left            Diastology                ventricular     LV e' medial:    6.99 cm/s                ejection        LV E/e' medial:  9.8                fraction by     LV e' lateral:   9.48 cm/s                PLAX is 67      LV E/e' lateral: 7.2                %. LVIDd:         3.80 cm LVIDs:         2.40 cm LV PW:         0.90 cm LV IVS:        1.00 cm LVOT diam:     1.40 cm LV SV:         23 LV SV Index:   12 LVOT Area:     1.54 cm  RIGHT VENTRICLE RV S prime:     13.70 cm/s TAPSE (M-mode): 1.2 cm LEFT ATRIUM              Index        RIGHT ATRIUM          Index LA diam:        3.00 cm  1.60 cm/m   RA Area:     8.97 cm LA Vol (A2C):   110.0 ml 58.58 ml/m  RA Volume:   17.00 ml 9.05 ml/m LA Vol (A4C):   59.0 ml  31.42 ml/m LA Biplane Vol: 82.9 ml  44.15 ml/m  AORTIC VALVE  PULMONIC VALVE LVOT Vmax:   74.10 cm/s  PR End Diast Vel: 2.13 msec LVOT Vmean:  49.900 cm/s LVOT VTI:    0.151 m AI PHT:      698 msec  AORTA Ao Root diam: 4.00 cm Ao Asc diam:  4.90 cm MITRAL VALVE               TRICUSPID VALVE MV Area (PHT): 3.42 cm    TR Peak grad:   31.6 mmHg MV Decel Time: 222 msec    TR Vmax:        281.00 cm/s MR Peak grad: 68.2 mmHg MR Vmax:      413.00 cm/s  SHUNTS MV E velocity: 68.30 cm/s  Systemic VTI:  0.15 m MV A velocity: 28.80 cm/s  Systemic Diam: 1.40 cm MV E/A ratio:  2.37 Public relations account executive signed by Floydene Flock Signature Date/Time: 10/12/2022/1:42:10 PM     Final    DG Chest 1 View  Result Date: 10/12/2022 CLINICAL DATA:  CHF EXAM: CHEST  1 VIEW COMPARISON:  10/10/2022. FINDINGS: Cardiac silhouette is prominent. There is mild vascular congestion without focal consolidation. No pneumothorax or pleural effusion. Aorta is calcified and dilated. IMPRESSION: Mild changes of CHF. Electronically Signed   By: Sammie Bench M.D.   On: 10/12/2022 10:36   EEG adult  Result Date: 10/11/2022 Lora Havens, MD     10/11/2022  4:52 PM Patient Name: ATAVIA POPPE MRN: [id number] Epilepsy Attending: Lora Havens Referring Physician/Provider: Lequita Halt, MD Date: 10/11/2022 Duration: 22.14 mins Patient history: 89yo F with ams. EEG to evaluate for seizure. Level of alertness: Awake AEDs during EEG study: GBP Technical aspects: This EEG study was done with scalp electrodes positioned according to the 10-20 International system of electrode placement. Electrical activity was reviewed with band pass filter of 1-'70Hz'$ , sensitivity of 7 uV/mm, display speed of 49m/sec with a '60Hz'$  notched filter applied as appropriate. EEG data were recorded continuously and digitally stored.  Video monitoring was available and reviewed as appropriate. Description: The posterior dominant rhythm consists of 8-9 Hz activity of moderate voltage (25-35 uV) seen predominantly in posterior head regions, symmetric and reactive to eye opening and eye closing. EEG showed intermittent generalized 3 to 6 Hz theta-delta slowing. Hyperventilation and photic stimulation were not performed.   ABNORMALITY - Intermittent slow, generalized IMPRESSION: This study is suggestive of mild diffuse encephalopathy, nonspecific etiology. No seizures or epileptiform discharges were seen throughout the recording. PLora Havens  MR BRAIN WO CONTRAST  Result Date: 10/11/2022 CLINICAL DATA:  Mental status change.  Unknown cause. EXAM: MRI HEAD WITHOUT CONTRAST TECHNIQUE: Multiplanar, multiecho pulse sequences of  the brain and surrounding structures were obtained without intravenous contrast. COMPARISON:  CT head 10/10/2022.  MR head 08/24/2016 FINDINGS: Brain: A heterogeneous extra-axial mass lesion adjacent to the frontal lobe in the anterior right cranial fossa demonstrates some interval growth. The lesion now measures 2.6 x 2.2 x 2.4 cm (AP x CC x TR). The lesion previously measured 2.3 x 1.8 x 2.3 cm. Confluent periventricular T2 hyperintensities are again seen. Remote lacunar infarcts are again noted in the corona radiata bilaterally. Scattered subcortical T2 hyperintensities have progressed. Chronic ischemic changes again noted within the thalami and brainstem. The internal auditory canals are within normal limits. Vascular: Flow is present in the major intracranial arteries. Skull and upper cervical spine: The craniocervical junction is normal. Upper cervical spine is within normal limits. Marrow signal is unremarkable. Sinuses/Orbits: Posterior ethmoid  air cells and sphenoid sinuses are opacified bilaterally. Restricted diffusion raises concern for infection. The left maxillary sinus is near completely opacified. A fluid level is present. Left frontal sinus is opacified. Small left mastoid effusion is present. No obstructing nasopharyngeal lesion is present. Bilateral lens replacements are noted. Globes and orbits are otherwise unremarkable. IMPRESSION: 1. Interval growth of extra-axial mass lesion adjacent to the anterior right frontal lobe now measuring 2.6 x 2.2 x 2.4 cm. This is most consistent with a meningioma. 2. Significant progression in surrounding vasogenic edema and local mass effect. 3. Progressive diffuse white matter disease likely reflects the sequela of chronic microvascular ischemia. 4. Diffuse paranasal sinus disease, left greater than right. Restricted diffusion raises concern for infectious sinusitis. No sinus expansion or osseous invasion is evident. These results will be called to the ordering  clinician or representative by the Radiologist Assistant, and communication documented in the PACS or [employer]. Electronically Signed   By: San Morelle M.D.   On: 10/11/2022 13:39        Scheduled Meds:  acidophilus  1 capsule Oral TID WC   amLODipine  2.5 mg Oral Daily   dexamethasone  4 mg Oral Q6H   doxycycline  100 mg Oral Q12H   feeding supplement  237 mL Oral BID BM   fexofenadine  180 mg Oral Daily   fluorometholone  1 drop Both Eyes QID   gabapentin  200 mg Oral TID   melatonin  5 mg Oral QHS   methenamine  1 g Oral BID WC   multivitamin with minerals  1 tablet Oral Daily   ofloxacin  1 drop Both Eyes QID   potassium chloride  10 mEq Oral BID   pravastatin  40 mg Oral Daily   sotalol  120 mg Oral BID   venlafaxine XR  75 mg Oral Q breakfast   warfarin  2.5 mg Oral ONCE-1600   Warfarin - Pharmacist Dosing Inpatient   Does not apply q1600   Continuous Infusions:  sodium chloride 50 mL/hr at 10/12/22 1101     LOS: 0 days       Phillips Climes, MD [facility]   To contact the attending provider between 7A-7P or the covering provider during after hours 7P-7A, please log into the web site [email or web address] and access using universal [place] password for that web site. If you do not have the password, please call the hospital operator.  10/12/2022, 2:16 PM

## 2022-10-12 NOTE — NC FL2 (Signed)
[place] LEVEL OF CARE FORM     IDENTIFICATION  Patient Name: [name] Birthdate: 1928/08/31 Sex: female Admission Date (Current Location): 10/10/2022  [facility] and [place] Number:  Herbalist and Address:  The [place]. [facility], [place] [address] [place], [place] [address]      Provider Number: [id number]  Attending Physician Name and Address:  Albertine Patricia, MD  Relative Name and Phone Number:  [name] (Daughter)  [phone] (Home Phone)    Current Level of Care: Hospital Recommended Level of Care: [place] Prior Approval Number:    Date Approved/Denied:   PASRR Number: [id number]  Discharge Plan: SNF    Current Diagnoses: Patient Active Problem List   Diagnosis Date Noted   AMS (altered mental status) 10/10/2022   Amnesia 07/10/2021   Bacterial conjunctivitis of both eyes 07/10/2021   Postherpetic neuralgia 07/10/2021   Vitamin D deficiency 07/10/2021   Contusion of left knee 04/16/2020   Disequilibrium 06/15/2019   Weakness 06/15/2019   Hypertensive heart failure ([place]) 02/14/2019   Recurrent falls 02/14/2019   Diarrhea 10/25/2018   Acute bronchitis 05/03/2018   Urinary tract infectious disease 04/27/2018   Arthritis of both knees 02/02/2018   Frequency of micturition 10/27/2017   Non-pressure chronic ulcer of other part of right foot limited to breakdown of skin ([place]) 07/29/2017   Anemia 01/29/2017   Benign neoplasm of meninges ([place]) 09/14/2016   Varicose veins of right lower extremity with complications 17/40/8144   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[place]) 01/22/2016   Non-thrombocytopenic purpura ([place]) 01/22/2016   Impaired fasting glucose 01/15/2016   Pain in left knee 07/01/2015   Localized, primary osteoarthritis of hand 10/16/2014   Acute blood loss anemia 04/12/2014   Allergic rhinitis 04/12/2014   Depression 04/12/2014   Aspiration pneumonia ([place]) 04/10/2014   Hip fracture ([place]) 04/08/2014    Fracture of femoral neck, right ([place]) 04/08/2014   Hyperlipidemia    Osteoporosis    Hypertension    A-fib ([place])    Cellulitis of left lower limb 09/18/2013   Peripheral venous insufficiency 03/22/2012   Gastro-esophageal reflux disease without esophagitis 09/30/2011   Allergy to IVP dye 09/29/2011   Spinal stenosis 07/30/2011   Non-refractory chronic migraine without aura 08/23/2009    Orientation RESPIRATION BLADDER Height & Weight     Self  Normal Incontinent, External catheter Weight: 168 lb (76.2 kg) Height:  '5\' 7"'$  (170.2 cm)  BEHAVIORAL SYMPTOMS/MOOD NEUROLOGICAL BOWEL NUTRITION STATUS      Continent Diet (see d/c summary)  AMBULATORY STATUS COMMUNICATION OF NEEDS Skin   Extensive Assist Verbally Normal                       Personal Care Assistance Level of Assistance  Bathing, Feeding, Dressing Bathing Assistance: Maximum assistance Feeding assistance: Independent Dressing Assistance: Maximum assistance     Functional Limitations Info  Sight, Hearing, Speech Sight Info: Adequate Hearing Info: Adequate Speech Info: Adequate    SPECIAL CARE FACTORS FREQUENCY  OT (By licensed OT), PT (By licensed PT)     PT Frequency: 5x/week OT Frequency: 5x/week            Contractures Contractures Info: Not present    Additional Factors Info  Code Status, Allergies Code Status Info: Full code Allergies Info: Contrast Media (iodinated Contrast Media) Dronedarone, latex, sulfa-antibiotics, Flecainide, amiodarone, Multaq (dronedarone Hydrochloride)           Current Medications (10/12/2022):  This is  the current hospital active medication list Current Facility-Administered Medications  Medication Dose Route Frequency Provider Last Rate Last Admin   0.9 %  sodium chloride infusion   Intravenous Continuous Lequita Halt, MD 50 mL/hr at 10/12/22 1101 New Bag at 10/12/22 1101   acetaminophen (TYLENOL) tablet 500 mg  500 mg Oral Q6H PRN Bud Face, PA-C   500 mg  at 10/12/22 0047   acidophilus (RISAQUAD) capsule 1 capsule  1 capsule Oral TID WC Wynetta Fines T, MD   1 capsule at 10/12/22 1036   amLODipine (NORVASC) tablet 2.5 mg  2.5 mg Oral Daily Wynetta Fines T, MD   2.5 mg at 10/12/22 1037   dexamethasone (DECADRON) tablet 4 mg  4 mg Oral Q6H Wynetta Fines T, MD   4 mg at 10/12/22 7001   doxycycline (VIBRA-TABS) tablet 50 mg  50 mg Oral Daily Smoot, Leary Roca, PA-C   50 mg at 10/12/22 1039   fexofenadine (ALLEGRA) tablet 180 mg  180 mg Oral Daily Wynetta Fines T, MD   180 mg at 10/12/22 1040   fluorometholone (FML) 0.1 % ophthalmic suspension 1 drop  1 drop Both Eyes QID [place]y, Silver Huguenin, MD       gabapentin (NEURONTIN) capsule 200 mg  200 mg Oral TID Bud Face, PA-C   200 mg at 10/12/22 1036   melatonin tablet 5 mg  5 mg Oral QHS Smoot, Sarah A, PA-C   5 mg at 10/11/22 2114   methenamine (MANDELAMINE) tablet 1 g  1 g Oral BID WC Smoot, Sarah A, PA-C   1 g at 10/12/22 1041   ofloxacin (OCUFLOX) 0.3 % ophthalmic solution 1 drop  1 drop Both Eyes QID Smoot, Sarah A, PA-C   1 drop at 10/12/22 1041   ondansetron (ZOFRAN) tablet 4 mg  4 mg Oral Q6H PRN Wynetta Fines T, MD       potassium chloride (KLOR-CON M) CR tablet 10 mEq  10 mEq Oral BID Wynetta Fines T, MD   10 mEq at 10/12/22 1036   pravastatin (PRAVACHOL) tablet 40 mg  40 mg Oral Daily Smoot, Sarah A, PA-C   40 mg at 10/12/22 1049   sotalol (BETAPACE) tablet 120 mg  120 mg Oral BID Wynetta Fines T, MD   120 mg at 10/12/22 1042   traMADol (ULTRAM) tablet 50 mg  50 mg Oral Q8H PRN Bud Face, PA-C   50 mg at 10/11/22 2114   venlafaxine XR (EFFEXOR-XR) 24 hr capsule 75 mg  75 mg Oral Q breakfast Wynetta Fines T, MD   75 mg at 10/12/22 1038   warfarin (COUMADIN) tablet 2.5 mg  2.5 mg Oral ONCE-1600 [place], Jarrett [place], [facility]       Warfarin - Pharmacist Dosing Inpatient   Does not apply q1600 Ventura Sellers, [facility]   Given at 10/11/22 1728     Discharge Medications: Please see discharge summary for a list of  discharge medications.  Relevant Imaging Results:  Relevant Lab Results:   Additional Information SSN Ettrick Napoleon, [place]

## 2022-10-12 NOTE — Consult Note (Addendum)
Reason for Consult:meningioma Referring Physician: TRH  [name] is an 89 y.o. female.   HPI:  89 year old female who presented to the hospital with AMS for 2 days. She apparently is very independent at home and her family noticed mental status chang and not being able to sign her name correctly. She has a known meningioma since 2017 but has never followed up with neurosurgery before. Patient is sitting up in bed alert but confused. Daughter is the historian   Past Medical History:  Diagnosis Date   A-fib [facility])    On chronic anticoagulation   Allergy    Dysrhythmia    hx of atrial fibrilation   GERD (gastroesophageal reflux disease)    Headache(784.0)    Hyperlipidemia    Hypertension    Osteoporosis    Peripheral vascular disease (HCC)    Pneumonia    PONV (postoperative nausea and vomiting)     Past Surgical History:  Procedure Laterality Date   BUNIONECTOMY     CARDIAC ELECTROPHYSIOLOGY MAPPING AND ABLATION     FRACTURE SURGERY     HEMIARTHROPLASTY HIP Right 04/08/2014   HIP ARTHROPLASTY Right 04/08/2014   Procedure: ARTHROPLASTY BIPOLAR HIP;  Surgeon: Kimberly Rossetti, MD;  Location: [place];  Service: Orthopedics;  Laterality: Right;    Allergies  Allergen Reactions   Contrast Media [Iodinated Contrast Media] Anaphylaxis   Dronedarone Other (See Comments)    CHF   Other Other (See Comments)    She is allergic to MRI contrast media per daughter Pt states she is allergic to heart medications, can not recall names/ steroid injections ivp dye. ivp dye, reported MRI contrast   Amiodarone     Pulmonary edema   Flecainide     Parkinson like raction   Latex Itching and Swelling   Multaq [Dronedarone Hydrochloride] Swelling   Sulfa Antibiotics Other (See Comments)    Makes her sicker & it causes her to have dark rings around her eyes    Social History   Tobacco Use   Smoking status: Former    Packs/day: 0.50    Years: 4.00    Total pack years: 2.00     Types: Cigarettes    Quit date: 1952    Years since quitting: 72.0   Smokeless tobacco: Former  Substance Use Topics   Alcohol use: Yes    Comment: occas wine    Family History  Problem Relation Age of Onset   Aneurysm Mother    Tuberculosis Father      Review of Systems  Positive ROS: as above  All other systems have been reviewed and were otherwise negative with the exception of those mentioned in the HPI and as above.  Objective: Vital signs in last 24 hours: Temp:  [97.7 F (36.5 C)-97.9 F (36.6 C)] 97.9 F (36.6 C) (01/01 0309) Pulse Rate:  [78-98] 78 (01/01 0309) Resp:  [17-21] 17 (01/01 0309) BP: (128-171)/(85-109) 128/90 (01/01 0309) SpO2:  [91 %-95 %] 93 % (01/01 0800)  General Appearance: Alert, cooperative, no distress, appears stated age Head: Normocephalic, without obvious abnormality, atraumatic Eyes: PERRL, conjunctiva/corneas clear, EOM's intact, fundi benign, both eyes      Lungs: respirations unlabored Heart: Regular rate and rhythm Extremities: Extremities normal, atraumatic, no cyanosis or edema Pulses: 2+ and symmetric all extremities Skin: Skin color, texture, turgor normal, no rashes or lesions  NEUROLOGIC:   Mental status: A&O x1, aphasia, good attention span, Memory and fund of knowledge poor Motor Exam -  grossly normal, normal tone and bulk Sensory Exam - grossly normal Reflexes: symmetric, no pathologic reflexes, No Hoffman's, No clonus Coordination - not tested Gait - not tested Balance - not tested Cranial Nerves: I: smell Not tested  II: visual acuity  OS: na    OD: na  II: visual fields Full to confrontation  II: pupils Equal, round, reactive to light  III,VII: ptosis None  III,IV,VI: extraocular muscles  Full ROM  V: mastication Normal  V: facial light touch sensation  Normal  V,VII: corneal reflex  Present  VII: facial muscle function - upper  Normal  VII: facial muscle function - lower Normal  VIII: hearing Not tested   IX: soft palate elevation  Normal  IX,X: gag reflex Present  XI: trapezius strength  5/5  XI: sternocleidomastoid strength 5/5  XI: neck flexion strength  5/5  XII: tongue strength  Normal    Data Review Lab Results  Component Value Date   WBC 5.2 10/12/2022   HGB 14.1 10/12/2022   HCT 41.8 10/12/2022   MCV 91.7 10/12/2022   PLT 168 10/12/2022   Lab Results  Component Value Date   NA 135 10/12/2022   K 3.5 10/12/2022   CL 100 10/12/2022   CO2 21 (L) 10/12/2022   BUN 18 10/12/2022   CREATININE 0.67 10/12/2022   GLUCOSE 168 (H) 10/12/2022   Lab Results  Component Value Date   INR 1.9 (H) 10/12/2022    Radiology: EEG adult  Result Date: 10/11/2022 Kimberly Havens, MD     10/11/2022  4:52 PM Patient Name: [name] MRN: [id number] Epilepsy Attending: Lora Lutz Referring Physician/Provider: Lequita Halt, MD Date: 10/11/2022 Duration: 22.14 mins Patient history: 89yo F with ams. EEG to evaluate for seizure. Level of alertness: Awake AEDs during EEG study: GBP Technical aspects: This EEG study was done with scalp electrodes positioned according to the 10-20 International system of electrode placement. Electrical activity was reviewed with band pass filter of 1-'70Hz'$ , sensitivity of 7 uV/mm, display speed of 27m/sec with a '60Hz'$  notched filter applied as appropriate. EEG data were recorded continuously and digitally stored.  Video monitoring was available and reviewed as appropriate. Description: The posterior dominant rhythm consists of 8-9 Hz activity of moderate voltage (25-35 uV) seen predominantly in posterior head regions, symmetric and reactive to eye opening and eye closing. EEG showed intermittent generalized 3 to 6 Hz theta-delta slowing. Hyperventilation and photic stimulation were not performed.   ABNORMALITY - Intermittent slow, generalized IMPRESSION: This study is suggestive of mild diffuse encephalopathy, nonspecific etiology. No seizures or epileptiform  discharges were seen throughout the recording. PLora Lutz  MR BRAIN WO CONTRAST  Result Date: 10/11/2022 CLINICAL DATA:  Mental status change.  Unknown cause. EXAM: MRI HEAD WITHOUT CONTRAST TECHNIQUE: Multiplanar, multiecho pulse sequences of the brain and surrounding structures were obtained without intravenous contrast. COMPARISON:  CT head 10/10/2022.  MR head 08/24/2016 FINDINGS: Brain: A heterogeneous extra-axial mass lesion adjacent to the frontal lobe in the anterior right cranial fossa demonstrates some interval growth. The lesion now measures 2.6 x 2.2 x 2.4 cm (AP x CC x TR). The lesion previously measured 2.3 x 1.8 x 2.3 cm. Confluent periventricular T2 hyperintensities are again seen. Remote lacunar infarcts are again noted in the corona radiata bilaterally. Scattered subcortical T2 hyperintensities have progressed. Chronic ischemic changes again noted within the thalami and brainstem. The internal auditory canals are within normal limits. Vascular: Flow is present in the major  intracranial arteries. Skull and upper cervical spine: The craniocervical junction is normal. Upper cervical spine is within normal limits. Marrow signal is unremarkable. Sinuses/Orbits: Posterior ethmoid air cells and sphenoid sinuses are opacified bilaterally. Restricted diffusion raises concern for infection. The left maxillary sinus is near completely opacified. A fluid level is present. Left frontal sinus is opacified. Small left mastoid effusion is present. No obstructing nasopharyngeal lesion is present. Bilateral lens replacements are noted. Globes and orbits are otherwise unremarkable. IMPRESSION: 1. Interval growth of extra-axial mass lesion adjacent to the anterior right frontal lobe now measuring 2.6 x 2.2 x 2.4 cm. This is most consistent with a meningioma. 2. Significant progression in surrounding vasogenic edema and local mass effect. 3. Progressive diffuse white matter disease likely reflects the  sequela of chronic microvascular ischemia. 4. Diffuse paranasal sinus disease, left greater than right. Restricted diffusion raises concern for infectious sinusitis. No sinus expansion or osseous invasion is evident. These results will be called to the ordering clinician or representative by the Radiologist Assistant, and communication documented in the PACS or [employer]. Electronically Signed   By: San Morelle M.D.   On: 10/11/2022 13:39   CT HEAD WO CONTRAST  Result Date: 10/10/2022 CLINICAL DATA:  Altered mental status EXAM: CT HEAD WITHOUT CONTRAST TECHNIQUE: Contiguous axial images were obtained from the base of the skull through the vertex without intravenous contrast. RADIATION DOSE REDUCTION: This exam was performed according to the departmental dose-optimization program which includes automated exposure control, adjustment of the mA and/or kV according to patient size and/or use of iterative reconstruction technique. COMPARISON:  Previous studies including the CT done on 05-28-21 FINDINGS: Brain: These 2.5 x 2.3 cm smooth marginated partly calcified mass inseparable from the meninges in the right frontal region with surrounding vasogenic edema. This lesion has been seen in multiple previous studies dating as far back as 01/08/2007. Vasogenic edema around the lesion is slightly more prominent in the current study. There are no signs of bleeding within the cranium. Cortical sulci are prominent. There is no significant shift of midline structures. Ventricles are unremarkable. Vascular: Unremarkable. Skull: Unremarkable. Sinuses/Orbits: There is almost complete opacification in frontal, ethmoid and sphenoid sinuses. There residual worsening of this finding since 2021-05-28. Other: None. IMPRESSION: There is 2.5 cm partly calcified mass in the right frontal region, most likely meningioma. There is vasogenic edema around this lesion which appears slightly more prominent in comparison with  the immediate previous examination of 2021/05/28. There are no signs of bleeding within the cranium. Atrophy. Small-vessel disease. Electronically Signed   By: Elmer Picker M.D.   On: 10/10/2022 14:07   DG Chest 1 View  Result Date: 10/10/2022 CLINICAL DATA:  Cough. EXAM: CHEST  1 VIEW COMPARISON:  08/11/2021 FINDINGS: Enlarged cardiac silhouette with a mild increase in size. Tortuous and partially calcified thoracic aorta. Possible hiatal hernia. Clear lungs. Mildly prominent pulmonary vasculature. Unremarkable bones. IMPRESSION: 1. Mild cardiomegaly and mild pulmonary vascular congestion. 2. Possible hiatal hernia. Electronically Signed   By: Claudie Revering M.D.   On: 10/10/2022 13:58     Assessment/Plan: 89 year old female with a known history of a right frontal meningioma. MRI brain shows a little more surrounding edema but the size of the tumor is unchanged from a year and a half ago. Not sure that her confusion is actually coming from this meningioma but could keep her on steroids for a couple days to see if this helps at all. Should definitely continue to work her  up for other sources of confusion. Daughter is adamant that she does not want her mother having brain surgery and we agree we do not believe that there is a roll for surgery here. Therefore we will sign off at this time. Please call with any further questions or concerns.     Ocie Cornfield Nuriya Stuck 10/12/2022 9:35 AM

## 2022-10-12 NOTE — TOC Progression Note (Signed)
Transition of Care [facility]) - Progression Note    Patient Details  Name: [name] MRN: [id number] Date of Birth: 05/21/1928  Transition of [facility]) CM/SW Contact  Cyndi Bender, RN Phone Number: 10/12/2022, 12:24 PM  Clinical Narrative:     Damaris Schooner to daughter, Marcie Bal, regarding transition needs. Marcie Bal is undecided if she wants patient to return home or go to SNF. Marcie Bal requested [facility] for home health if patient returns home. Tommi Rumps with [facility] accepted referral for [facility] PT, OT. Marcie Bal would also like hospital bed if she discharges home. TOC will continue to follow.  Expected Discharge Plan: [place] Barriers to Discharge: Continued Medical Work up  Expected Discharge Plan and [place] arrangements for the past 2 months: [place]: PT, OT [facility] Agency: [place] Date [place]: 10/12/22 Time [place]: 1224 Representative spoke with at [place]: [place] Determinants of Health ([place]) Interventions SDOH Screenings   Tobacco Use: Medium Risk (10/10/2022)    Readmission Risk Interventions     No data to display

## 2022-10-13 ENCOUNTER — Ambulatory Visit: Payer: Medicare Other | Admitting: Podiatry

## 2022-10-13 DIAGNOSIS — I480 Paroxysmal atrial fibrillation: Secondary | ICD-10-CM | POA: Diagnosis not present

## 2022-10-13 DIAGNOSIS — R4182 Altered mental status, unspecified: Secondary | ICD-10-CM | POA: Diagnosis not present

## 2022-10-13 LAB — CBC
HCT: 40.3 % (ref 36.0–46.0)
Hemoglobin: 12.8 g/dL (ref 12.0–15.0)
MCH: 30.3 pg (ref 26.0–34.0)
MCHC: 31.8 g/dL (ref 30.0–36.0)
MCV: 95.5 fL (ref 80.0–100.0)
Platelets: 183 10*3/uL (ref 150–400)
RBC: 4.22 MIL/uL (ref 3.87–5.11)
RDW: 13.9 % (ref 11.5–15.5)
WBC: 6.6 10*3/uL (ref 4.0–10.5)
nRBC: 0 % (ref 0.0–0.2)

## 2022-10-13 LAB — URINE CULTURE: Culture: 20000 — AB

## 2022-10-13 LAB — PROTIME-INR
INR: 3.1 — ABNORMAL HIGH (ref 0.8–1.2)
Prothrombin Time: 31.5 seconds — ABNORMAL HIGH (ref 11.4–15.2)

## 2022-10-13 LAB — BASIC METABOLIC PANEL
Anion gap: 10 (ref 5–15)
BUN: 35 mg/dL — ABNORMAL HIGH (ref 8–23)
CO2: 20 mmol/L — ABNORMAL LOW (ref 22–32)
Calcium: 8.8 mg/dL — ABNORMAL LOW (ref 8.9–10.3)
Chloride: 106 mmol/L (ref 98–111)
Creatinine, Ser: 0.87 mg/dL (ref 0.44–1.00)
GFR, Estimated: >60 mL/min (ref >60)
Glucose, Bld: 157 mg/dL — ABNORMAL HIGH (ref 70–99)
Potassium: 4.3 mmol/L (ref 3.5–5.1)
Sodium: 136 mmol/L (ref 135–145)

## 2022-10-13 MED ORDER — SORBITOL 70 % SOLN
960.0000 mL | TOPICAL_OIL | Freq: Once | ORAL | Status: DC
  Filled 2022-10-13: qty 240

## 2022-10-13 MED ORDER — BISACODYL 5 MG PO TBEC
10.0000 mg | DELAYED_RELEASE_TABLET | Freq: Two times a day (BID) | ORAL | Status: DC
  Administered 2022-10-13 – 2022-10-14 (×2): 10 mg via ORAL
  Filled 2022-10-13 (×3): qty 2

## 2022-10-13 MED ORDER — SODIUM CHLORIDE 0.9 % IV SOLN
1.0000 g | INTRAVENOUS | Status: DC
  Administered 2022-10-13: 1 g via INTRAVENOUS
  Filled 2022-10-13: qty 10

## 2022-10-13 MED ORDER — DEXAMETHASONE SODIUM PHOSPHATE 4 MG/ML IJ SOLN
4.0000 mg | Freq: Two times a day (BID) | INTRAMUSCULAR | Status: DC
  Administered 2022-10-13 – 2022-10-14 (×2): 4 mg via INTRAVENOUS
  Filled 2022-10-13 (×2): qty 1

## 2022-10-13 NOTE — Progress Notes (Signed)
   Durable Medical Equipment (From admission, onward)        Start     Ordered  10/13/22 1053  For home use only DME Hospital bed  Once      Question Answer Comment Length of Need Lifetime  Patient has (list medical condition): medical history significant of chronic A-fib on Coumadin, brain meningioma, recurrent UTIs, HTN, PVD, question of CHF, chronic pain . She is with known history of meningioma, and recent treatment for UTI.  The above medical condition requires: Patient requires the ability to reposition frequently  Head must be elevated greater than: 30 degrees  Bed type Semi-electric  Support Surface: Gel Overlay    10/13/22 1054

## 2022-10-13 NOTE — Progress Notes (Signed)
ANTICOAGULATION CONSULT NOTE - Follow Up Consult   Pharmacy Consult for Warfarin  Indication: atrial fibrillation  Allergies  Allergen Reactions   Contrast Media [Iodinated Contrast Media] Anaphylaxis   Dronedarone Other (See Comments)    CHF   Other Other (See Comments)    She is allergic to MRI contrast media per daughter Pt states she is allergic to heart medications, can not recall names/ steroid injections ivp dye. ivp dye, reported MRI contrast   Amiodarone     Pulmonary edema   Flecainide     Parkinson like raction   Latex Itching and Swelling   Multaq [Dronedarone Hydrochloride] Swelling   Sulfa Antibiotics Other (See Comments)    Makes her sicker & it causes her to have dark rings around her eyes    Patient Measurements: Height: '5\' 7"'$  (170.2 cm) Weight: 76.2 kg (168 lb) IBW/kg (Calculated) : 61.6  Vital Signs: Temp: 97.1 F (36.2 C) (01/02 0504) Temp Source: Oral (01/02 0504) BP: 135/92 (01/02 0504) Pulse Rate: 69 (01/02 0504)  Labs: Recent Labs    10/10/22 1057 10/11/22 0539 10/12/22 0313 10/13/22 0502  HGB 13.8  --  14.1 12.8  HCT 43.7  --  41.8 40.3  PLT 166  --  168 183  LABPROT 20.7* 18.4* 21.4* 31.5*  INR 1.8* 1.6* 1.9* 3.1*  CREATININE 0.65  --  0.67 0.87     Estimated Creatinine Clearance: 43.9 mL/min (by C-G formula based on SCr of 0.87 mg/dL).   Medical History: Past Medical History:  Diagnosis Date   A-fib ([place])    On chronic anticoagulation   Allergy    Dysrhythmia    hx of atrial fibrilation   GERD (gastroesophageal reflux disease)    Headache(784.0)    Hyperlipidemia    Hypertension    Osteoporosis    Peripheral vascular disease (HCC)    Pneumonia    PONV (postoperative nausea and vomiting)    Assessment: Patient on warfarin PTA for hx of Afib. Per nursing regimen at home is 2.'5mg'$  every day based on discussion with daughter but that her regimen has been changed around frequently.   INR up to 3.1 today (up from  1.9)  Goal of Therapy:  INR 2-3 Monitor platelets by anticoagulation protocol: Yes   Plan:  No warfarin today Repeat INR in AM for further dosing.   Daily CBC  Thank you Anette Guarneri, PharmD

## 2022-10-13 NOTE — Progress Notes (Signed)
PROGRESS NOTE    [name]  ALP:[id number] DOB: 1928-06-23 DOA: 10/10/2022 PCP: Prince Solian, MD    Chief Complaint  Patient presents with   Weakness    Brief Narrative:   [name] is a 89 y.o. female with medical history significant of chronic A-fib on Coumadin, brain meningioma, recurrent UTIs, HTN, PVD, question of CHF, chronic pain on narcotics, brought in by family members for altered mentations.  She is with known history of meningioma, and recent treatment for UTI.   Assessment & Plan:    Acute encephalopathy -This is likely  multifactorial, some underlying dementia, as well with known meningioma, which appears to be with worsening edema and mass effect. -EEG with no seizure activities. -Initially altered, but mentation much improved after starting IV Decadron, as per daughter close to baseline today.   Brain meningioma -Brain MRI significant for slight increase in meningioma side, and worsening surrounding mass effect and vasogenic edema, neurosurgery input greatly appreciated, continue with steroids, given she had improved with steroids, recommendation to continue with steroid taper for next 10 days, then to monitor off steroids. -She is not a surgical candidate as discussed with neurosurgery.     Right eye conjunctivitis -Continue eyedrops -Continue doxycycline for total 30 days  Pansinusitis -As evident in MRI brain, continue with full dose doxycycline 100 mg oral twice daily x 10 days.(She is already on doxycycline 50 mg oral twice daily x 30 days to treat her right eye conjunctivitis)  Hx of CHF -X-ray shows mild cardiomegaly and pulmonary congestion, clinically patient has no hypoxia breathing room air blood pressure significant elevated. -Clinically patient appears to be volume contracted due to poor oral intake -Hold off Lasix -2D echo with preserved EF, and grade 3 diastolic dysfunction -BP control   HTN -With elevated blood  pressure -Increase amlodipine dosage   PAF - continue sotalol -Pharmacy to dose Coumadin   Chronic neuralgia -Continue home dose of as needed tramadol and gabapentin   Moderate protein calorie malnutrition -With significant decreased oral intake recently -TSH WNL   Hematuria -Subacute-chronic, given there is no significant symptoms and with recent normal renal and bladder ultrasound. -Ultrasound significant only for 1 mm stone in the renal pelvis, otherwise no evidence of obstruction     DVT prophylaxis: Warfarin Code Status: Full Family Communication: daughter  at bedside Disposition:   Status is: She remains altered   Consultants:  Neurosurgery    Subjective:  Patient denies any complaints today, daughter at bedside reports mom mentation much improved, she had BM today x 2.  Objective: Vitals:   10/12/22 1700 10/12/22 2033 10/12/22 2342 10/13/22 0504  BP: (!) 129/90 97/67 104/72 (!) 135/92  Pulse: 72 80 74 69  Resp: (!) 32 '19 20 15  '$ Temp: 97.9 F (36.6 C) 98.1 F (36.7 C) 98.3 F (36.8 C) (!) 97.1 F (36.2 C)  TempSrc: Oral Oral Axillary Oral  SpO2: 93% 95% 96% 94%  Weight:      Height:        Intake/Output Summary (Last 24 hours) at 10/13/2022 1640 Last data filed at 10/13/2022 0100 Gross per 24 hour  Intake 402.31 ml  Output 100 ml  Net 302.31 ml   Filed Weights   10/10/22 2008  Weight: 76.2 kg    Examination:  Awake Alert, is not, sitting on a chair today, talkative, much more appropriate and coherent today Symmetrical Chest wall movement, Good air movement bilaterally, CTAB RRR,No Gallops,Rubs or new Murmurs, No Parasternal Heave +  ve B.Sounds, Abd Soft, No tenderness, No rebound - guarding or rigidity. No Cyanosis, Clubbing or edema, No new Rash or bruise       Data Reviewed: I have personally reviewed following labs and imaging studies  CBC: Recent Labs  Lab 10/10/22 1057 10/12/22 0313 10/13/22 0502  WBC 6.3 5.2 6.6  HGB 13.8  14.1 12.8  HCT 43.7 41.8 40.3  MCV 95.6 91.7 95.5  PLT 166 168 637    Basic Metabolic Panel: Recent Labs  Lab 10/10/22 1057 10/12/22 0313 10/13/22 0502  NA 140 135 136  K 4.3 3.5 4.3  CL 103 100 106  CO2 27 21* 20*  GLUCOSE 143* 168* 157*  BUN 17 18 35*  CREATININE 0.65 0.67 0.87  CALCIUM 9.2 8.8* 8.8*    GFR: Estimated Creatinine Clearance: 43.9 mL/min (by C-G formula based on SCr of 0.87 mg/dL).  Liver Function Tests: Recent Labs  Lab 10/10/22 1057  AST 15  ALT 11  ALKPHOS 46  BILITOT 0.8  PROT 7.2  ALBUMIN 4.1    CBG: No results for input(s): "GLUCAP" in the last 168 hours.   Recent Results (from the past 240 hour(s))  Resp panel by RT-PCR (RSV, Flu A&B, Covid) Anterior Nasal Swab     Status: None   Collection Time: 10/10/22 10:57 AM   Specimen: Anterior Nasal Swab  Result Value Ref Range Status   SARS Coronavirus 2 by RT PCR NEGATIVE NEGATIVE Final    Comment: (NOTE) SARS-CoV-2 target nucleic acids are NOT DETECTED.  The SARS-CoV-2 RNA is generally detectable in upper respiratory specimens during the acute phase of infection. The lowest concentration of SARS-CoV-2 viral copies this assay can detect is 138 copies/mL. A negative result does not preclude SARS-Cov-2 infection and should not be used as the sole basis for treatment or other patient management decisions. A negative result may occur with  improper specimen collection/handling, submission of specimen other than nasopharyngeal swab, presence of viral mutation(s) within the areas targeted by this assay, and inadequate number of viral copies(<138 copies/mL). A negative result must be combined with clinical observations, patient history, and epidemiological information. The expected result is Negative.  Fact Sheet for Patients:  EntrepreneurPulse.com.au  Fact Sheet for Healthcare Providers:  IncredibleEmployment.be  This test is no t yet approved or  cleared by the [place] FDA and  has been authorized for detection and/or diagnosis of SARS-CoV-2 by FDA under an Emergency Use Authorization (EUA). This EUA will remain  in effect (meaning this test can be used) for the duration of the COVID-19 declaration under Section 564(b)(1) of the Act, 21 U.S.C.section 360bbb-3(b)(1), unless the authorization is terminated  or revoked sooner.       Influenza A by PCR NEGATIVE NEGATIVE Final   Influenza B by PCR NEGATIVE NEGATIVE Final    Comment: (NOTE) The Xpert Xpress SARS-CoV-2/FLU/RSV plus assay is intended as an aid in the diagnosis of influenza from Nasopharyngeal swab specimens and should not be used as a sole basis for treatment. Nasal washings and aspirates are unacceptable for Xpert Xpress SARS-CoV-2/FLU/RSV testing.  Fact Sheet for Patients: EntrepreneurPulse.com.au  Fact Sheet for Healthcare Providers: IncredibleEmployment.be  This test is not yet approved or cleared by the [place] FDA and has been authorized for detection and/or diagnosis of SARS-CoV-2 by FDA under an Emergency Use Authorization (EUA). This EUA will remain in effect (meaning this test can be used) for the duration of the COVID-19 declaration under Section 564(b)(1) of the Act, 21 U.S.C.  section 360bbb-3(b)(1), unless the authorization is terminated or revoked.     Resp Syncytial Virus by PCR NEGATIVE NEGATIVE Final    Comment: (NOTE) Fact Sheet for Patients: EntrepreneurPulse.com.au  Fact Sheet for Healthcare Providers: IncredibleEmployment.be  This test is not yet approved or cleared by the [place] FDA and has been authorized for detection and/or diagnosis of SARS-CoV-2 by FDA under an Emergency Use Authorization (EUA). This EUA will remain in effect (meaning this test can be used) for the duration of the COVID-19 declaration under Section 564(b)(1) of the Act, 21  U.S.C. section 360bbb-3(b)(1), unless the authorization is terminated or revoked.  Performed at [facility], [address] [place], [place] [address]   Urine Culture     Status: Abnormal   Collection Time: 10/10/22  1:23 PM   Specimen: Urine, Clean Catch  Result Value Ref Range Status   Specimen Description   Final    URINE, CLEAN CATCH Performed at [facility], [address] [place], [place] [address]    Special Requests   Final    NONE Performed at [facility], [address] [place], [place] [address]    Culture (A)  Final    20,000 COLONIES/mL STREPTOCOCCUS GALLOLYTICUS Standardized susceptibility testing for this organism is not available. Performed at [facility], [place] [address], [place], [place] [address]    Report Status 10/13/2022 FINAL  Final         Radiology Studies: CT RENAL STONE STUDY  Result Date: 10/12/2022 CLINICAL DATA:  Hematuria EXAM: CT ABDOMEN AND PELVIS WITHOUT CONTRAST TECHNIQUE: Multidetector CT imaging of the abdomen and pelvis was performed following the standard protocol without IV contrast. RADIATION DOSE REDUCTION: This exam was performed according to the departmental dose-optimization program which includes automated exposure control, adjustment of the mA and/or kV according to patient size and/or use of iterative reconstruction technique. COMPARISON:  None Available. FINDINGS: Lower chest: Heart is enlarged in size. There are small linear patchy densities in both lower lung fields. Calcifications are noted in pericardium. Hepatobiliary: Beam hardening artifacts are partly obscuring the liver. No definite focal abnormalities are seen. There is no dilation of bile ducts. Gallbladder stones are seen. Pancreas: No focal abnormalities are seen. Spleen: Unremarkable. Adrenals/Urinary Tract: Adrenals are unremarkable. There is no hydronephrosis. There is 4 mm calculus in left renal  pelvis. Ureters are unremarkable. Urinary bladder is not distended. Urinary bladder is mostly obscured by beam hardening artifacts. Stomach/Bowel: There is moderate sized fixed hiatal hernia. Small bowel loops are not dilated. The appendix is not distinctly seen. There is no pericecal inflammation. Scattered diverticula are seen in colon without signs of focal acute diverticulitis. Moderate amount of stool is seen in colon. There is large amount of stool in rectum. Transverse diameter of rectum measures 7.4 cm. Vascular/Lymphatic: Scattered arterial calcifications are seen. Reproductive: Small calcified uterine fibroid is seen. Other: There is no ascites or pneumoperitoneum. Bilateral inguinal hernias containing fat are seen, larger on the left side. Musculoskeletal: There is arthroplasty in right hip. There is 60% decrease in height of body of T12 vertebra. There is retropulsion in the posterior margin of T12 vertebral body. There is first-degree spondylolisthesis at the L4-L5 level. Severe degenerative changes are noted with disc space narrowing, bony spurs, facet hypertrophy, spinal stenosis and encroachment of neural foramina at multiple levels. IMPRESSION: There is no evidence of intestinal obstruction or pneumoperitoneum. There is no hydronephrosis. There is 4 mm calculus in the left renal pelvis. Gallbladder  stones.  There is no dilation of bile ducts. Diverticulosis of colon without signs of focal diverticulitis. There is fecal impaction in rectum. Moderate sized hiatal hernia. Lumbar spondylosis. Decrease in height of body of T12 vertebra may be old compression fracture. Less likely possibility would be recent compression. Please correlate with clinical symptoms. Aortic atherosclerosis. Other findings as described in the body of the report. Electronically Signed   By: Elmer Picker M.D.   On: 10/12/2022 15:39   ECHOCARDIOGRAM COMPLETE  Result Date: 10/12/2022    ECHOCARDIOGRAM REPORT   Patient  Name:   [name] Date of Exam: 10/12/2022 Medical Rec #:  [id number]    Height:       67.0 in Accession #:    [id number]   Weight:       168.0 lb Date of Birth:  1928-03-02    BSA:          1.878 m Patient Age:    38 years     BP:           134/96 mmHg Patient Gender: F            HR:           75 bpm. Exam Location:  Inpatient Procedure: 2D Echo, Color Doppler and Cardiac Doppler Indications:    CHF-Acute Diastolic I14.43  History:        Patient has no prior history of Echocardiogram examinations.                 AMS, Arrythmias:Atrial Fibrillation; Risk Factors:Dyslipidemia,                 Hypertension and Former Smoker.  Sonographer:    Greer Pickerel Referring Phys: [id number] Lequita Halt  Sonographer Comments: Technically challenging study due to limited acoustic windows. Image acquisition challenging due to patient body habitus and Image acquisition challenging due to respiratory motion. IMPRESSIONS  1. Left ventricular ejection fraction, by estimation, is 60 to 65%. Left ventricular ejection fraction by PLAX is 67 %. The left ventricle has normal function. The left ventricle has no regional wall motion abnormalities. There is mild left ventricular hypertrophy. Left ventricular diastolic parameters are consistent with Grade III diastolic dysfunction (restrictive).  2. Right ventricular systolic function is normal. The right ventricular size is normal. There is normal pulmonary artery systolic pressure.  3. Left atrial size was severely dilated.  4. Right atrial size was mildly dilated.  5. The mitral valve is degenerative. Mild mitral valve regurgitation. No evidence of mitral stenosis.  6. The tricuspid valve is degenerative. Tricuspid valve regurgitation is moderate.  7. The aortic valve is tricuspid. Aortic valve regurgitation is moderate. Aortic valve sclerosis/calcification is present, without any evidence of aortic stenosis. FINDINGS  Left Ventricle: Left ventricular ejection fraction, by estimation, is  60 to 65%. Left ventricular ejection fraction by PLAX is 67 %. The left ventricle has normal function. The left ventricle has no regional wall motion abnormalities. The left ventricular internal cavity size was normal in size. There is mild left ventricular hypertrophy. Left ventricular diastolic parameters are consistent with Grade III diastolic dysfunction (restrictive). Right Ventricle: The right ventricular size is normal. No increase in right ventricular wall thickness. Right ventricular systolic function is normal. There is normal pulmonary artery systolic pressure. The tricuspid regurgitant velocity is 2.81 m/s, and  with an assumed right atrial pressure of 3 mmHg, the estimated right ventricular systolic pressure is 76.1 mmHg. Left Atrium: Left atrial size was  severely dilated. Right Atrium: Right atrial size was mildly dilated. Pericardium: There is no evidence of pericardial effusion. Mitral Valve: The mitral valve is degenerative in appearance. Mild mitral valve regurgitation. No evidence of mitral valve stenosis. Tricuspid Valve: The tricuspid valve is degenerative in appearance. Tricuspid valve regurgitation is moderate . No evidence of tricuspid stenosis. Aortic Valve: The aortic valve is tricuspid. Aortic valve regurgitation is moderate. Aortic regurgitation PHT measures 698 msec. Aortic valve sclerosis/calcification is present, without any evidence of aortic stenosis. Pulmonic Valve: The pulmonic valve was grossly normal. Pulmonic valve regurgitation is mild. No evidence of pulmonic stenosis. Aorta: The aortic root is normal in size and structure. IAS/Shunts: No atrial level shunt detected by color flow Doppler.  LEFT VENTRICLE PLAX 2D LV EF:         Left            Diastology                ventricular     LV e' medial:    6.99 cm/s                ejection        LV E/e' medial:  9.8                fraction by     LV e' lateral:   9.48 cm/s                PLAX is 67      LV E/e' lateral: 7.2                 %. LVIDd:         3.80 cm LVIDs:         2.40 cm LV PW:         0.90 cm LV IVS:        1.00 cm LVOT diam:     1.40 cm LV SV:         23 LV SV Index:   12 LVOT Area:     1.54 cm  RIGHT VENTRICLE RV S prime:     13.70 cm/s TAPSE (M-mode): 1.2 cm LEFT ATRIUM              Index        RIGHT ATRIUM          Index LA diam:        3.00 cm  1.60 cm/m   RA Area:     8.97 cm LA Vol (A2C):   110.0 ml 58.58 ml/m  RA Volume:   17.00 ml 9.05 ml/m LA Vol (A4C):   59.0 ml  31.42 ml/m LA Biplane Vol: 82.9 ml  44.15 ml/m  AORTIC VALVE             PULMONIC VALVE LVOT Vmax:   74.10 cm/s  PR End Diast Vel: 2.13 msec LVOT Vmean:  49.900 cm/s LVOT VTI:    0.151 m AI PHT:      698 msec  AORTA Ao Root diam: 4.00 cm Ao Asc diam:  4.90 cm MITRAL VALVE               TRICUSPID VALVE MV Area (PHT): 3.42 cm    TR Peak grad:   31.6 mmHg MV Decel Time: 222 msec    TR Vmax:        281.00 cm/s MR Peak grad: 68.2 mmHg MR Vmax:      413.00 cm/s  SHUNTS MV E velocity: 68.30 cm/s  Systemic VTI:  0.15 m MV A velocity: 28.80 cm/s  Systemic Diam: 1.40 cm MV E/A ratio:  2.37 Sabina Custovic Electronically signed by Floydene Flock Signature Date/Time: 10/12/2022/1:42:10 PM    Final    DG Chest 1 View  Result Date: 10/12/2022 CLINICAL DATA:  CHF EXAM: CHEST  1 VIEW COMPARISON:  10/10/2022. FINDINGS: Cardiac silhouette is prominent. There is mild vascular congestion without focal consolidation. No pneumothorax or pleural effusion. Aorta is calcified and dilated. IMPRESSION: Mild changes of CHF. Electronically Signed   By: Sammie Bench M.D.   On: 10/12/2022 10:36   EEG adult  Result Date: 10/11/2022 Lora Havens, MD     10/11/2022  4:52 PM Patient Name: [name] MRN: [id number] Epilepsy Attending: Lora Havens Referring Physician/Provider: Lequita Halt, MD Date: 10/11/2022 Duration: 22.14 mins Patient history: 89yo F with ams. EEG to evaluate for seizure. Level of alertness: Awake AEDs during EEG study: GBP Technical  aspects: This EEG study was done with scalp electrodes positioned according to the 10-20 International system of electrode placement. Electrical activity was reviewed with band pass filter of 1-'70Hz'$ , sensitivity of 7 uV/mm, display speed of 29m/sec with a '60Hz'$  notched filter applied as appropriate. EEG data were recorded continuously and digitally stored.  Video monitoring was available and reviewed as appropriate. Description: The posterior dominant rhythm consists of 8-9 Hz activity of moderate voltage (25-35 uV) seen predominantly in posterior head regions, symmetric and reactive to eye opening and eye closing. EEG showed intermittent generalized 3 to 6 Hz theta-delta slowing. Hyperventilation and photic stimulation were not performed.   ABNORMALITY - Intermittent slow, generalized IMPRESSION: This study is suggestive of mild diffuse encephalopathy, nonspecific etiology. No seizures or epileptiform discharges were seen throughout the recording. Priyanka OBarbra Sarks       Scheduled Meds:  acidophilus  1 capsule Oral TID WC   amLODipine  2.5 mg Oral Daily   bisacodyl  10 mg Oral BID   cyanocobalamin  1,000 mcg Intramuscular Daily   Followed by   [Derrill MemoON 10/15/2022] cyanocobalamin  1,000 mcg Intramuscular Weekly   dexamethasone (DECADRON) injection  4 mg Intravenous Q6H   doxycycline  100 mg Oral Q12H   feeding supplement  237 mL Oral BID BM   fexofenadine  180 mg Oral Daily   fluorometholone  1 drop Both Eyes QID   gabapentin  200 mg Oral TID   melatonin  5 mg Oral QHS   methenamine  1 g Oral BID WC   multivitamin with minerals  1 tablet Oral Daily   ofloxacin  1 drop Both Eyes QID   pantoprazole  40 mg Oral Daily   potassium chloride  10 mEq Oral BID   pravastatin  40 mg Oral Daily   sorbitol, milk of mag, mineral oil, glycerin (SMOG) enema  960 mL Rectal Once   sotalol  120 mg Oral BID   venlafaxine XR  75 mg Oral Q breakfast   Warfarin - Pharmacist Dosing Inpatient   Does not apply  q1600   Continuous Infusions:  sodium chloride 50 mL/hr at 10/13/22 1436     LOS: 1 day       DPhillips Climes MD [facility]   To contact the attending provider between 7A-7P or the covering provider during after hours 7P-7A, please log into the web site [email or web address] and access using universal [place] password for that web site. If you do not have  the password, please call the hospital operator.  10/13/2022, 4:40 PM

## 2022-10-13 NOTE — TOC Progression Note (Signed)
Transition of Care [facility]) - Progression Note    Patient Details  Name: GRAZIA TAFFE MRN: [id number] Date of Birth: Jun 26, 1928  [facility]) CM/SW [place], RN Phone Number: 10/13/2022, 11:05 AM  Clinical Narrative:     Damaris Schooner to Marcie Bal regarding plan to discharge home with home health and hospital bed. Patient has all other needed DME> Marcie Bal is agreeable to use in house provider [facility] for DME needs.  Notified Erasmo Downer with adapt of bed order. Marcie Bal requesting bed be delivered in the am and Antony Haste, brother, is contact person to setup schedule for delivery.  TOC will continue to follow for needs.  Expected Discharge Plan: [place] Barriers to Discharge: Continued Medical Work up  Expected Discharge Plan and [place] arrangements for the past 2 months: [place]                 DME Arranged: Hospital bed DME Agency: [employer] Date DME Agency Contacted: 10/13/22 Time DME Agency Contacted: 1104 Representative spoke with at DME Agency: Erasmo Downer HH Arranged: PT, OT [place] Agency: [place] Date [place]: 10/12/22 Time [place]: 1224 Representative spoke with at [place]: [place] Determinants of Health ([place]) Interventions SDOH Screenings   Tobacco Use: Medium Risk (10/10/2022)    Readmission Risk Interventions     No data to display

## 2022-10-14 DIAGNOSIS — I10 Essential (primary) hypertension: Secondary | ICD-10-CM

## 2022-10-14 DIAGNOSIS — D329 Benign neoplasm of meninges, unspecified: Secondary | ICD-10-CM | POA: Diagnosis not present

## 2022-10-14 DIAGNOSIS — G9341 Metabolic encephalopathy: Secondary | ICD-10-CM

## 2022-10-14 DIAGNOSIS — I480 Paroxysmal atrial fibrillation: Secondary | ICD-10-CM | POA: Diagnosis not present

## 2022-10-14 LAB — PROTIME-INR
INR: 3.8 — ABNORMAL HIGH (ref 0.8–1.2)
Prothrombin Time: 36.8 seconds — ABNORMAL HIGH (ref 11.4–15.2)

## 2022-10-14 MED ORDER — AMOXICILLIN-POT CLAVULANATE 875-125 MG PO TABS: 1.0000 | ORAL_TABLET | Freq: Two times a day (BID) | ORAL | 0 refills | Status: AC

## 2022-10-14 MED ORDER — VITAMIN B-12 1000 MCG PO TABS: 1000.0000 ug | ORAL_TABLET | Freq: Every day | ORAL | 1 refills | Status: AC

## 2022-10-14 MED ORDER — WARFARIN SODIUM 2.5 MG PO TABS: 1.5000 mg | ORAL_TABLET | ORAL | Status: AC

## 2022-10-14 MED ORDER — DEXAMETHASONE 1 MG PO TABS: ORAL_TABLET | ORAL | 0 refills | Status: AC

## 2022-10-14 NOTE — Progress Notes (Signed)
ANTICOAGULATION CONSULT NOTE - Follow Up Consult   Pharmacy Consult for Warfarin  Indication: atrial fibrillation  Allergies  Allergen Reactions   Contrast Media [Iodinated Contrast Media] Anaphylaxis   Dronedarone Other (See Comments)    CHF   Other Other (See Comments)    She is allergic to MRI contrast media per daughter Pt states she is allergic to heart medications, can not recall names/ steroid injections ivp dye. ivp dye, reported MRI contrast   Amiodarone     Pulmonary edema   Depakote [Divalproex Sodium] Other (See Comments)    Balance issues   Flecainide     Parkinson like raction   Keflex [Cephalexin] Other (See Comments)    C_Diff   Latex Itching and Swelling   Multaq [Dronedarone Hydrochloride] Swelling   Sulfa Antibiotics Other (See Comments)    Makes her sicker & it causes her to have dark rings around her eyes    Patient Measurements: Height: '5\' 7"'$  (170.2 cm) Weight: 76.2 kg (168 lb) IBW/kg (Calculated) : 61.6  Vital Signs: Temp: 97.8 F (36.6 C) (01/03 0600) Temp Source: Oral (01/03 0600) BP: 126/89 (01/03 0600) Pulse Rate: 73 (01/03 0600)  Labs: Recent Labs    10/12/22 0313 10/13/22 0502 10/14/22 0402  HGB 14.1 12.8  --   HCT 41.8 40.3  --   PLT 168 183  --   LABPROT 21.4* 31.5* 36.8*  INR 1.9* 3.1* 3.8*  CREATININE 0.67 0.87  --      Estimated Creatinine Clearance: 43.9 mL/min (by C-G formula based on SCr of 0.87 mg/dL).   Medical History: Past Medical History:  Diagnosis Date   A-fib ([place])    On chronic anticoagulation   Allergy    Dysrhythmia    hx of atrial fibrilation   GERD (gastroesophageal reflux disease)    Headache(784.0)    Hyperlipidemia    Hypertension    Osteoporosis    Peripheral vascular disease (HCC)    Pneumonia    PONV (postoperative nausea and vomiting)    Assessment: Patient on warfarin PTA for hx of Afib. PTA regimen is 2.'5mg'$  every day except 1.'25mg'$  on Wednesday.  Based on discussion with daughter, her  regimen has been changed around frequently.   INR up to 3.8 today.  No bleeding noted.  Continue to hold warfarin.   Goal of Therapy:  INR 2-3 Monitor platelets by anticoagulation protocol: Yes   Plan:  No warfarin today Repeat INR in AM for further dosing.   Daily CBC  Quency Tober, Pharm.D., BCPS Clinical Pharmacist Clinical phone for 10/14/2022 from 7:30-3:00 is [phone].  **Pharmacist phone directory can be found on [email or web address] listed under [place].  10/14/2022 8:23 AM

## 2022-10-14 NOTE — Progress Notes (Signed)
Occupational Therapy Treatment Patient Details Name: [name] MRN: [id number] DOB: 1928/08/14 Today's Date: 10/14/2022   History of present illness 89 y.o. female presents to [facility] hospital on 10/10/2022 with AMS. CT head demonstrates R frontal mass with more prominent vasogenic edema than in previous studies. PMH includes afib, GERD, HA, HTN, PVD, PNA.   OT comments  Pt seen in conjunction with PT to maximize pts activity tolerance and optimize pt participation. Pt continues to present with impaired cognition and  impaired balance. Pt currently requires MIN A +2 for safety for ambulatory ADL transfer with RW, MOD A for 3/3 toileting tasks and min guard for standing grooming tasks at sink. Pt pleasantly confused during session but following one step commands with + time. pt overall improved this session in comparison to previous OT session therefore update DME recs as indicated below as pt already has needed DME.  Pt would continue to benefit from skilled occupational therapy while admitted and after d/c to address the below listed limitations in order to improve overall functional mobility and facilitate independence with BADL participation. DC plan remains appropriate, will follow acutely per POC.      Recommendations for follow up therapy are one component of a multi-disciplinary discharge planning process, led by the attending physician.  Recommendations may be updated based on patient status, additional functional criteria and insurance authorization.    Follow Up Recommendations  Home health OT     Assistance Recommended at Discharge Frequent or constant Supervision/Assistance  Patient can return home with the following  Two people to help with walking and/or transfers;Two people to help with bathing/dressing/bathroom;Assistance with feeding;Direct supervision/assist for medications management;Direct supervision/assist for financial management;Assistance with cooking/housework;Assist for  transportation;Help with stairs or ramp for entrance   Equipment Recommendations  None recommended by OT    Recommendations for Other Services      Precautions / Restrictions Precautions Precautions: Fall Restrictions Weight Bearing Restrictions: No       Mobility Bed Mobility Overal bed mobility: Needs Assistance Bed Mobility: Supine to Sit, Sit to Supine     Supine to sit: Min assist, +2 for safety/equipment Sit to supine: Min assist, +2 for safety/equipment   General bed mobility comments: assist for trunk and LE management, scooting to/from EOB.    Transfers Overall transfer level: Needs assistance Equipment used: Rolling walker (2 wheels) Transfers: Sit to/from Stand Sit to Stand: Min assist, +2 safety/equipment           General transfer comment: assist for rise and steady, cues for correct hand placement with standing and sitting. heavy posterior bias during pericare requiring assist to correct     Balance Overall balance assessment: Needs assistance Sitting-balance support: No upper extremity supported Sitting balance-Leahy Scale: Fair   Postural control: Posterior lean Standing balance support: Bilateral upper extremity supported, During functional activity, Reliant on assistive device for balance Standing balance-Leahy Scale: Poor                             ADL either performed or assessed with clinical judgement   ADL Overall ADL's : Needs assistance/impaired     Grooming: Wash/dry hands;Standing;Min guard;Cueing for sequencing;Cueing for safety Grooming Details (indicate cue type and reason): cues to initiate task but able to problem solve once task was initiated                 Toilet Transfer: Minimal assistance;+2 for safety/equipment;BSC/3in1;Regular Toilet;Grab bars;Rolling walker (2 wheels);Cueing  for sequencing;Cueing for safety Toilet Transfer Details (indicate cue type and reason): pt needed MIN A for RW mgmt and  assist for sequencing tasks d/t cog impairments Toileting- Clothing Manipulation and Hygiene: Sit to/from stand;Moderate assistance Toileting - Clothing Manipulation Details (indicate cue type and reason): A d/t posterior lean when completing pericare and for clothing mgmt   Tub/Shower Transfer Details (indicate cue type and reason): caregiver reports she has TTB and has assist with bathing Functional mobility during ADLs: Minimal assistance;+2 for safety/equipment;Rolling walker (2 wheels) General ADL Comments: ADL participation impacted by impaired balance and  cognitive deficits    Extremity/Trunk Assessment Upper Extremity Assessment Upper Extremity Assessment: Generalized weakness   Lower Extremity Assessment Lower Extremity Assessment: Generalized weakness        Vision Baseline Vision/History: 1 Wears glasses;6 Macular Degeneration (reading)     Perception Perception Perception: Not tested   Praxis Praxis Praxis: Not tested    Cognition Arousal/Alertness: Awake/alert Behavior During Therapy: WFL for tasks assessed/performed Overall Cognitive Status: Impaired/Different from baseline Area of Impairment: Orientation, Attention, Memory, Following commands, Safety/judgement, Awareness, Problem solving                 Orientation Level: Disoriented to, Place, Time, Situation Current Attention Level: Focused Memory: Decreased recall of precautions, Decreased short-term memory Following Commands: Follows one step commands with increased time   Awareness: Intellectual Problem Solving: Slow processing, Decreased initiation, Difficulty sequencing, Requires verbal cues General Comments: pt require step-by-step commands for ADL tasks initially, but once started pt requires less cuing. Pt laughing at seemingly inappropriate times during session, stating "this is funny, this is hilarious". Poor safety awareness with RW use        Exercises      Shoulder Instructions        General Comments      Pertinent Vitals/ Pain       Pain Assessment Pain Assessment: Faces Faces Pain Scale: No hurt  Home Living                                          Prior Functioning/Environment              Frequency  Min 2X/week        Progress Toward Goals  OT Goals(current goals can now be found in the care plan section)  Progress towards OT goals: Progressing toward goals  Acute Rehab OT Goals Patient Stated Goal: none stated OT Goal Formulation: With patient Time For Goal Achievement: 10/26/22 Potential to Achieve Goals: [place] Discharge plan remains appropriate;Frequency remains appropriate    Co-evaluation      Reason for Co-Treatment: For patient/therapist safety;To address functional/ADL transfers;Necessary to address cognition/behavior during functional activity PT goals addressed during session: Mobility/safety with mobility;Balance        AM-PAC OT "6 Clicks" Daily Activity     Outcome Measure   Help from another person eating meals?: A Little Help from another person taking care of personal grooming?: A Little Help from another person toileting, which includes using toliet, bedpan, or urinal?: A Little Help from another person bathing (including washing, rinsing, drying)?: A Little Help from another person to put on and taking off regular upper body clothing?: A Little Help from another person to put on and taking off regular lower body clothing?: A Little 6 Click Score: 18    End of Session Equipment  Utilized During Treatment: Gait belt;Rolling walker (2 wheels)  OT Visit Diagnosis: Unsteadiness on feet (R26.81);Other abnormalities of gait and mobility (R26.89);Muscle weakness (generalized) (M62.81);Other symptoms and signs involving the nervous system (R29.898);Other symptoms and signs involving cognitive function   Activity Tolerance Patient tolerated treatment well   Patient Left in bed;with call  bell/phone within reach;with bed alarm set;with family/visitor present   Nurse Communication Mobility status (Rn present at end of session)        Time: 8335-8251 OT Time Calculation (min): 29 min  Charges: OT General Charges $OT Visit: 1 Visit OT Treatments $Self Care/Home Management : 8-22 mins  Harley Alto., COTA/L Acute Rehabilitation Services [phone]   Precious Haws 10/14/2022, 12:37 PM

## 2022-10-14 NOTE — TOC Transition Note (Signed)
Transition of Care [facility]) - CM/SW Discharge Note   Patient Details  Name: [name] MRN: [id number] Date of Birth: 02/05/1928  [facility] CM/SW Contact:  Cyndi Bender, RN Phone Number: 10/14/2022, 2:41 PM   Clinical Narrative:     Patient is stable for discharge. Family will transport home. Cory with [facility] aware of discharge. Patient's son , Antony Haste, states hospital bed has been delivered. Address, Phone number and PCP verified.    Final next level of care: Home w Home Health Services Barriers to Discharge: Barriers Resolved   Patient Goals and CMS Choice CMS Medicare.gov Compare Post Acute Care list provided to:: Patient Represenative (must comment) Choice offered to / list presented to : Adult Children  Discharge Placement                         Discharge Plan and Services Additional resources added to the After Visit Summary for                  DME Arranged: Hospital bed DME Agency: [employer] Date DME Agency Contacted: 10/13/22 Time DME Agency Contacted: 1104 Representative spoke with at DME Agency: Erasmo Downer HH Arranged: PT, OT [place] Agency: [place] Date [place]: 10/12/22 Time [place]: 1224 Representative spoke with at [place]: [place] ([place]) Interventions SDOH Screenings   Tobacco Use: Medium Risk (10/10/2022)     Readmission Risk Interventions    10/14/2022    2:41 PM  Readmission Risk Prevention Plan  Transportation Screening Complete  PCP or Specialist Appt within 5-7 Days Complete  Home Care Screening Complete  Medication Review (RN CM) Complete

## 2022-10-14 NOTE — Progress Notes (Signed)
Patient discharged per md orders. Patient stable at time of discharge with no indications of acute distress noted. Patient accompanied by daughter and personal caregiver. All patient belongings with patient's family. Reviewed discharge instructions, medications, follow up, ER precautions with loved one [name]), allowed time for questions, verbalizes understanding, hard copy provided. Informed loved one that patient has home meds in pharmacy, per daughter [name] ok for them to leave without the medications, 2nd RN verification Latanya Maudlin (charge RN).

## 2022-10-14 NOTE — Progress Notes (Signed)
Physical Therapy Treatment Patient Details Name: MAYRENE BASTARACHE MRN: [id number] DOB: 01-18-1928 Today's Date: 10/14/2022   History of Present Illness 89 y.o. female presents to [facility] hospital on 10/10/2022 with AMS. CT head demonstrates R frontal mass with more prominent vasogenic edema than in previous studies. PMH includes afib, GERD, HA, HTN, PVD, PNA.    PT Comments    Pt more alert today, confused and laughing inappropriately during session. Pt overall requiring min +2 safety for room-distance mobility, impaired safety awareness and safe use of RW during gait. Pt also unaware of deficits, for example pt with heavy posterior bias during pericare after toileting requiring posterior support and pt states "you're pushing me into the trash can (forward)". One of pt's aides present during session, states she will have 24/7 care at d/c at home. Will continue to follow while acute.    Recommendations for follow up therapy are one component of a multi-disciplinary discharge planning process, led by the attending physician.  Recommendations may be updated based on patient status, additional functional criteria and insurance authorization.  Follow Up Recommendations  Home health PT (family declined SNF, updating recommendation to reflect home) Can patient physically be transported by private vehicle: Yes   Assistance Recommended at Discharge Frequent or constant Supervision/Assistance  Patient can return home with the following A lot of help with bathing/dressing/bathroom;Assistance with cooking/housework;Assistance with feeding;Direct supervision/assist for medications management;Direct supervision/assist for financial management;Assist for transportation;Help with stairs or ramp for entrance;A little help with walking and/or transfers   Equipment Recommendations  Hospital bed    Recommendations for Other Services       Precautions / Restrictions Precautions Precautions: Fall Restrictions Weight  Bearing Restrictions: No     Mobility  Bed Mobility Overal bed mobility: Needs Assistance Bed Mobility: Supine to Sit, Sit to Supine     Supine to sit: Min assist, +2 for safety/equipment Sit to supine: Min assist, +2 for safety/equipment   General bed mobility comments: assist for trunk and LE management, scooting to/from EOB.    Transfers Overall transfer level: Needs assistance Equipment used: Rolling walker (2 wheels) Transfers: Sit to/from Stand Sit to Stand: Min assist, +2 safety/equipment           General transfer comment: assist for rise and steady, cues for correct hand placement with standing and sitting. heavy posterior bias during pericare requiring assist to correct    Ambulation/Gait Ambulation/Gait assistance: Min assist, +2 safety/equipment Gait Distance (Feet): 10 Feet (x2 - to and from toilet) Assistive device: Rolling walker (2 wheels), 1 person hand held assist Gait Pattern/deviations: Step-through pattern, Decreased stride length, Trunk flexed       General Gait Details: assist to steady and guide RW, mod cuing for form and safety   Stairs             Wheelchair Mobility    Modified Rankin (Stroke Patients Only)       Balance Overall balance assessment: Needs assistance Sitting-balance support: No upper extremity supported Sitting balance-Leahy Scale: Fair   Postural control: Posterior lean Standing balance support: Bilateral upper extremity supported, During functional activity, Reliant on assistive device for balance Standing balance-Leahy Scale: Poor                              Cognition Arousal/Alertness: Awake/alert Behavior During Therapy: WFL for tasks assessed/performed Overall Cognitive Status: Impaired/Different from baseline Area of Impairment: Orientation, Attention, Memory, Following commands, Safety/judgement, Awareness, Problem solving  Orientation Level: Disoriented to,  Place, Time, Situation Current Attention Level: Focused Memory: Decreased recall of precautions, Decreased short-term memory Following Commands: Follows one step commands with increased time Safety/Judgement: Decreased awareness of safety, Decreased awareness of deficits Awareness: Intellectual Problem Solving: Slow processing, Decreased initiation, Difficulty sequencing, Requires verbal cues General Comments: pt require step-by-step commands for ADL tasks initially, but once started pt requires less cuing. Pt laughing at seemingly inappropriate times during session, stating "this is funny, this is hilarious". Poor safety awareness with RW use        Exercises      General Comments        Pertinent Vitals/Pain Pain Assessment Pain Assessment: Faces Faces Pain Scale: No hurt Pain Intervention(s): Monitored during session    Home Living                          Prior Function            PT Goals (current goals can now be found in the care plan section) Acute Rehab PT Goals Patient Stated Goal: to improve mobility and cognition, return toward baseline or minA level PT Goal Formulation: With family Time For Goal Achievement: 10/25/22 Potential to Achieve Goals: Fair Progress towards PT goals: Progressing toward goals    Frequency    Min 2X/week      PT Plan Current plan remains appropriate    Co-evaluation   Reason for Co-Treatment: For patient/therapist safety;To address functional/ADL transfers;Necessary to address cognition/behavior during functional activity PT goals addressed during session: Mobility/safety with mobility;Balance        AM-PAC PT "6 Clicks" Mobility   Outcome Measure  Help needed turning from your back to your side while in a flat bed without using bedrails?: A Little Help needed moving from lying on your back to sitting on the side of a flat bed without using bedrails?: A Lot Help needed moving to and from a bed to a chair  (including a wheelchair)?: A Lot Help needed standing up from a chair using your arms (e.g., wheelchair or bedside chair)?: A Lot Help needed to walk in hospital room?: A Lot Help needed climbing 3-5 steps with a railing? : Total 6 Click Score: 12    End of Session Equipment Utilized During Treatment: Gait belt Activity Tolerance: Patient tolerated treatment well;Patient limited by fatigue Patient left: in bed;with call bell/phone within reach;with bed alarm set;with family/visitor present Nurse Communication: Mobility status PT Visit Diagnosis: Other abnormalities of gait and mobility (R26.89);Muscle weakness (generalized) (M62.81)     Time: 4401-0272 PT Time Calculation (min) (ACUTE ONLY): 29 min  Charges:  $Therapeutic Activity: 8-22 mins                     Stacie Glaze, PT DPT Acute Rehabilitation Services Pager [phone]  Office [phone]    Cedar E Ruffin Pyo 10/14/2022, 12:01 PM

## 2022-10-14 NOTE — Discharge Summary (Signed)
PATIENT DETAILS Name: [name] Age: 89 y.o. Sex: female Date of Birth: 23-Nov-1927 MRN: [id number]. Admitting Physician: Lequita Halt, MD MHD:[id number], Steva Ready, MD  Admit Date: 10/10/2022 Discharge date: 10/14/2022  Recommendations for Outpatient Follow-up:  Follow up with PCP in 1-2 weeks Please obtain CMP/CBC in one week  Admitted From:  Home  Disposition: Home health   Discharge Condition: fair  CODE STATUS:   Code Status: Full Code   Diet recommendation:  Diet Order             Diet - low sodium heart healthy           Diet regular Room service appropriate? Yes; Fluid consistency: Thin  Diet effective now                    Brief Summary: 89 y.o. female with medical history significant of chronic A-fib on Coumadin, brain meningioma, recurrent UTIs, HTN, PVD, question of CHF, chronic pain on narcotics, brought in by family members for altered mental status-subsequently admitted to the hospital service.  Brief Hospital Course: Acute metabolic encephalopathy  Superimposed on dementia-etiology of encephalopathy felt to be multifactorial from meningioma with worsening edema/mass effect Continues to have some confusion but seems to be overall better.  Per prior notes-has a reported history of some amount of dementia at baseline. No evidence of seizures-EEG negative Hopefully once steroids are tapered-she is back to her usual surroundings-her mentation will be further improved.  Meningioma with vasogenic edema/local mass effect Continue tapering Decadron Per neurosurgery-family does not desire any surgery.  Chronic diastolic heart failure No evidence of volume overload  Pansinusitis Will switch from Doxy to Augmentin x 5 days.  Right eye conjunctivitis Continue prior eyedrops-continue doxycycline per prior plan of 30 days.  HTN BP stable Continue amlodipine  PAF Stable Continue sotalol-hold Coumadin-repeat INR on Friday before dosing Coumadin  accordingly. Continue to monitor INR closely in the outpatient setting Per family-INR has been fluctuating quite a bit lately-PCP to consider switching to Eliquis.  Hematuria No structural lesions seen on imaging studies Likely sequelae of Coumadin Continue to monitor in the outpatient setting by PCP If it persists-May need outpatient urology evaluation  Protein calorie malnutrition-moderate  Palliative care Full code for now Long conversation with daughter Investment banker, corporate at [place]) and son at bedside.  Daughter inquired about-how comfort care is done in the outpatient setting. We talked about minimizing some of her medications-perhaps even stopping anticoagulation at some point-and touching base with PCP regarding palliative/DNR conversation.  Nutrition Status: Nutrition Problem: Inadequate oral intake Etiology: lethargy/confusion, poor appetite Signs/Symptoms: meal completion < 50% Interventions: Ensure Enlive (each supplement provides 350kcal and 20 grams of protein), MVI   Obesity: Estimated body mass index is 26.31 kg/m as calculated from the following:   Height as of this encounter: '5\' 7"'$  (1.702 m).   Weight as of this encounter: 76.2 kg.    Discharge Diagnoses:  Principal Problem:   AMS (altered mental status) Active Problems:   A-fib [facility])   Spinal stenosis   Discharge Instructions:  Activity:  As tolerated with Full fall precautions use walker/cane & assistance as needed  Discharge Instructions     Call MD for:  difficulty breathing, headache or visual disturbances   Complete by: As directed    Call MD for:  extreme fatigue   Complete by: As directed    Call MD for:  persistant dizziness or light-headedness   Complete by: As directed    Call MD  for:  persistant nausea and vomiting   Complete by: As directed    Diet - low sodium heart healthy   Complete by: As directed    Discharge instructions   Complete by: As directed    Follow with Primary MD  Avva,  Ravisankar, MD in 1-2 weeks  Hold Coumadin until 1/5-repeat INR-and dose Coumadin accordingly.  Please get a complete blood count and chemistry panel checked by your Primary MD at your next visit, and again as instructed by your Primary MD.  Get Medicines reviewed and adjusted: Please take all your medications with you for your next visit with your Primary MD  Laboratory/radiological data: Please request your Primary MD to go over all hospital tests and procedure/radiological results at the follow up, please ask your Primary MD to get all Hospital records sent to his/her office.  In some cases, they will be blood work, cultures and biopsy results pending at the time of your discharge. Please request that your primary care M.D. follows up on these results.  Also Note the following: If you experience worsening of your admission symptoms, develop shortness of breath, life threatening emergency, suicidal or homicidal thoughts you must seek medical attention immediately by calling 911 or calling your MD immediately  if symptoms less severe.  You must read complete instructions/literature along with all the possible adverse reactions/side effects for all the Medicines you take and that have been prescribed to you. Take any new Medicines after you have completely understood and accpet all the possible adverse reactions/side effects.   Do not drive when taking Pain medications or sleeping medications (Benzodaizepines)  Do not take more than prescribed Pain, Sleep and Anxiety Medications. It is not advisable to combine anxiety,sleep and pain medications without talking with your primary care practitioner  Special Instructions: If you have smoked or chewed Tobacco  in the last 2 yrs please stop smoking, stop any regular Alcohol  and or any Recreational drug use.  Wear Seat belts while driving.  Please note: You were cared for by a hospitalist during your hospital stay. Once you are discharged, your  primary care physician will handle any further medical issues. Please note that NO REFILLS for any discharge medications will be authorized once you are discharged, as it is imperative that you return to your primary care physician (or establish a relationship with a primary care physician if you do not have one) for your post hospital discharge needs so that they can reassess your need for medications and monitor your lab values.   Increase activity slowly   Complete by: As directed       Allergies as of 10/14/2022       Reactions   Contrast Media [iodinated Contrast Media] Anaphylaxis   Dronedarone Other (See Comments)   CHF   Other Other (See Comments)   She is allergic to MRI contrast media per daughter Pt states she is allergic to heart medications, can not recall names/ steroid injections ivp dye. ivp dye, reported MRI contrast   Amiodarone    Pulmonary edema   Depakote [divalproex Sodium] Other (See Comments)   Balance issues   Flecainide    Parkinson like raction   Keflex [cephalexin] Other (See Comments)   C_Diff   Latex Itching, Swelling   Multaq [dronedarone Hydrochloride] Swelling   Sulfa Antibiotics Other (See Comments)   Makes her sicker & it causes her to have dark rings around her eyes        Medication List  TAKE these medications    acetaminophen 500 MG tablet Commonly known as: TYLENOL Take 1,000 mg by mouth in the morning, at noon, and at bedtime.   amLODipine 2.5 MG tablet Commonly known as: NORVASC TAKE 1 TABLET BY MOUTH EVERY DAY   amoxicillin-clavulanate 875-125 MG tablet Commonly known as: AUGMENTIN Take 1 tablet by mouth 2 (two) times daily for 4 days.   cyanocobalamin 1000 MCG tablet Commonly known as: VITAMIN B12 Take 1 tablet (1,000 mcg total) by mouth daily.   dexamethasone 1 MG tablet Commonly known as: DECADRON Take 4 tablets twice daily for 2 days, then 3 tablets twice daily for 2 days, 2 tablets twice daily for 2 days, 1 tablet  twice daily for 2 days, then half a tablet twice daily for 2 days and stop.   docusate sodium 100 MG capsule Commonly known as: COLACE Take 100 mg by mouth daily.   doxycycline 100 MG tablet Commonly known as: ADOXA Take 100 mg by mouth 2 (two) times daily. X 30 days   EPINEPHrine 0.3 mg/0.3 mL Soaj injection Commonly known as: EPI-PEN Inject into the muscle.   fexofenadine 180 MG tablet Commonly known as: ALLEGRA Take 180 mg by mouth daily.   fluorometholone 0.1 % ophthalmic ointment Commonly known as: FML Place 1 Application into both eyes 4 (four) times daily.   fluorometholone 0.1 % ophthalmic suspension Commonly known as: FML Place 1 drop into both eyes 4 (four) times daily.   gabapentin 100 MG capsule Commonly known as: NEURONTIN Take 200 mg by mouth 3 (three) times daily.   Klor-Con M10 10 MEQ tablet Generic drug: potassium chloride Take 10 mEq by mouth 2 (two) times daily.   melatonin 5 MG Tabs Take 5 mg by mouth at bedtime.   methenamine 1 g tablet Commonly known as: HIPREX Take 1 g by mouth daily.   ofloxacin 0.3 % ophthalmic solution Commonly known as: OCUFLOX Place 1 drop into both eyes 4 (four) times daily.   ondansetron 4 MG tablet Commonly known as: ZOFRAN Take 4 mg by mouth every 6 (six) hours as needed for nausea or vomiting.   polyethylene glycol 17 g packet Commonly known as: MIRALAX / GLYCOLAX Take 17 g by mouth daily as needed for mild constipation.   potassium chloride 10 MEQ tablet Commonly known as: KLOR-CON Take 1 tablet (10 mEq total) by mouth 2 (two) times daily.   pravastatin 40 MG tablet Commonly known as: PRAVACHOL Take 40 mg by mouth daily.   sotalol 120 MG tablet Commonly known as: BETAPACE TAKE 1&1/2 TABLETS BY MOUTH 2 TIMES DAILY. What changed:  how much to take when to take this additional instructions   traMADol 50 MG tablet Commonly known as: ULTRAM Take 50 mg by mouth every 6 (six) hours as needed for  moderate pain.   traMADol 50 MG tablet Commonly known as: ULTRAM Take 50 mg by mouth every 8 (eight) hours as needed. for pain   venlafaxine XR 75 MG 24 hr capsule Commonly known as: EFFEXOR-XR TAKE 1 CAPSULE BY MOUTH DAILY WITH BREAKFAST.   Vitamin D (Ergocalciferol) 1.25 MG (50000 UNIT) Caps capsule Commonly known as: DRISDOL Take 50,000 Units by mouth every 7 (seven) days. Wednesday   warfarin 2.5 MG tablet Commonly known as: COUMADIN Take 0.5-1 tablets (1.25-2.5 mg total) by mouth See admin instructions. Taking daily 2.5 mg except on Wednesday 1.5 mg Start taking on: October 16, 2022 What changed:  how much to take when to take this  additional instructions These instructions start on October 16, 2022. If you are unsure what to do until then, ask your doctor or other care provider.               Durable Medical Equipment  (From admission, onward)           Start     Ordered   10/13/22 1053  For home use only DME Hospital bed  Once       Question Answer Comment  Length of Need Lifetime   Patient has (list medical condition): medical history significant of chronic A-fib on Coumadin, brain meningioma, recurrent UTIs, HTN, PVD, question of CHF, chronic pain . She is with known history of meningioma, and recent treatment for UTI.   The above medical condition requires: Patient requires the ability to reposition frequently   Head must be elevated greater than: 30 degrees   Bed type Semi-electric   Support Surface: Gel Overlay      10/13/22 1054            Follow-up Information     [facility] Follow up.   Specialty: Home Health Services Why: Home Health has been arranged. They will contact you to schedule a vist within 48hrs post discharge. Contact information: [place] [address] [phone]         Prince Solian, MD. Schedule an appointment as soon as possible for a visit on 10/16/2022.   Specialty: Internal  Medicine Contact information: [place] [address] [phone]                Allergies  Allergen Reactions   Contrast Media [Iodinated Contrast Media] Anaphylaxis   Dronedarone Other (See Comments)    CHF   Other Other (See Comments)    She is allergic to MRI contrast media per daughter Pt states she is allergic to heart medications, can not recall names/ steroid injections ivp dye. ivp dye, reported MRI contrast   Amiodarone     Pulmonary edema   Depakote [Divalproex Sodium] Other (See Comments)    Balance issues   Flecainide     Parkinson like raction   Keflex [Cephalexin] Other (See Comments)    C_Diff   Latex Itching and Swelling   Multaq [Dronedarone Hydrochloride] Swelling   Sulfa Antibiotics Other (See Comments)    Makes her sicker & it causes her to have dark rings around her eyes     Other Procedures/Studies: CT RENAL STONE STUDY  Result Date: 10/12/2022 CLINICAL DATA:  Hematuria EXAM: CT ABDOMEN AND PELVIS WITHOUT CONTRAST TECHNIQUE: Multidetector CT imaging of the abdomen and pelvis was performed following the standard protocol without IV contrast. RADIATION DOSE REDUCTION: This exam was performed according to the departmental dose-optimization program which includes automated exposure control, adjustment of the mA and/or kV according to patient size and/or use of iterative reconstruction technique. COMPARISON:  None Available. FINDINGS: Lower chest: Heart is enlarged in size. There are small linear patchy densities in both lower lung fields. Calcifications are noted in pericardium. Hepatobiliary: Beam hardening artifacts are partly obscuring the liver. No definite focal abnormalities are seen. There is no dilation of bile ducts. Gallbladder stones are seen. Pancreas: No focal abnormalities are seen. Spleen: Unremarkable. Adrenals/Urinary Tract: Adrenals are unremarkable. There is no hydronephrosis. There is 4 mm calculus in left renal pelvis.  Ureters are unremarkable. Urinary bladder is not distended. Urinary bladder is mostly obscured by beam hardening artifacts. Stomach/Bowel: There is  moderate sized fixed hiatal hernia. Small bowel loops are not dilated. The appendix is not distinctly seen. There is no pericecal inflammation. Scattered diverticula are seen in colon without signs of focal acute diverticulitis. Moderate amount of stool is seen in colon. There is large amount of stool in rectum. Transverse diameter of rectum measures 7.4 cm. Vascular/Lymphatic: Scattered arterial calcifications are seen. Reproductive: Small calcified uterine fibroid is seen. Other: There is no ascites or pneumoperitoneum. Bilateral inguinal hernias containing fat are seen, larger on the left side. Musculoskeletal: There is arthroplasty in right hip. There is 60% decrease in height of body of T12 vertebra. There is retropulsion in the posterior margin of T12 vertebral body. There is first-degree spondylolisthesis at the L4-L5 level. Severe degenerative changes are noted with disc space narrowing, bony spurs, facet hypertrophy, spinal stenosis and encroachment of neural foramina at multiple levels. IMPRESSION: There is no evidence of intestinal obstruction or pneumoperitoneum. There is no hydronephrosis. There is 4 mm calculus in the left renal pelvis. Gallbladder stones.  There is no dilation of bile ducts. Diverticulosis of colon without signs of focal diverticulitis. There is fecal impaction in rectum. Moderate sized hiatal hernia. Lumbar spondylosis. Decrease in height of body of T12 vertebra may be old compression fracture. Less likely possibility would be recent compression. Please correlate with clinical symptoms. Aortic atherosclerosis. Other findings as described in the body of the report. Electronically Signed   By: Elmer Picker M.D.   On: 10/12/2022 15:39   ECHOCARDIOGRAM COMPLETE  Result Date: 10/12/2022    ECHOCARDIOGRAM REPORT   Patient Name:   [name] Date of Exam: 10/12/2022 Medical Rec #:  [id number]    Height:       67.0 in Accession #:    [id number]   Weight:       168.0 lb Date of Birth:  08-15-1928    BSA:          1.878 m Patient Age:    56 years     BP:           134/96 mmHg Patient Gender: F            HR:           75 bpm. Exam Location:  Inpatient Procedure: 2D Echo, Color Doppler and Cardiac Doppler Indications:    CHF-Acute Diastolic N00.37  History:        Patient has no prior history of Echocardiogram examinations.                 AMS, Arrythmias:Atrial Fibrillation; Risk Factors:Dyslipidemia,                 Hypertension and Former Smoker.  Sonographer:    Greer Pickerel Referring Phys: [id number] Lequita Halt  Sonographer Comments: Technically challenging study due to limited acoustic windows. Image acquisition challenging due to patient body habitus and Image acquisition challenging due to respiratory motion. IMPRESSIONS  1. Left ventricular ejection fraction, by estimation, is 60 to 65%. Left ventricular ejection fraction by PLAX is 67 %. The left ventricle has normal function. The left ventricle has no regional wall motion abnormalities. There is mild left ventricular hypertrophy. Left ventricular diastolic parameters are consistent with Grade III diastolic dysfunction (restrictive).  2. Right ventricular systolic function is normal. The right ventricular size is normal. There is normal pulmonary artery systolic pressure.  3. Left atrial size was severely dilated.  4. Right atrial size was mildly dilated.  5. The mitral valve  is degenerative. Mild mitral valve regurgitation. No evidence of mitral stenosis.  6. The tricuspid valve is degenerative. Tricuspid valve regurgitation is moderate.  7. The aortic valve is tricuspid. Aortic valve regurgitation is moderate. Aortic valve sclerosis/calcification is present, without any evidence of aortic stenosis. FINDINGS  Left Ventricle: Left ventricular ejection fraction, by estimation, is 60 to 65%.  Left ventricular ejection fraction by PLAX is 67 %. The left ventricle has normal function. The left ventricle has no regional wall motion abnormalities. The left ventricular internal cavity size was normal in size. There is mild left ventricular hypertrophy. Left ventricular diastolic parameters are consistent with Grade III diastolic dysfunction (restrictive). Right Ventricle: The right ventricular size is normal. No increase in right ventricular wall thickness. Right ventricular systolic function is normal. There is normal pulmonary artery systolic pressure. The tricuspid regurgitant velocity is 2.81 m/s, and  with an assumed right atrial pressure of 3 mmHg, the estimated right ventricular systolic pressure is 54.9 mmHg. Left Atrium: Left atrial size was severely dilated. Right Atrium: Right atrial size was mildly dilated. Pericardium: There is no evidence of pericardial effusion. Mitral Valve: The mitral valve is degenerative in appearance. Mild mitral valve regurgitation. No evidence of mitral valve stenosis. Tricuspid Valve: The tricuspid valve is degenerative in appearance. Tricuspid valve regurgitation is moderate . No evidence of tricuspid stenosis. Aortic Valve: The aortic valve is tricuspid. Aortic valve regurgitation is moderate. Aortic regurgitation PHT measures 698 msec. Aortic valve sclerosis/calcification is present, without any evidence of aortic stenosis. Pulmonic Valve: The pulmonic valve was grossly normal. Pulmonic valve regurgitation is mild. No evidence of pulmonic stenosis. Aorta: The aortic root is normal in size and structure. IAS/Shunts: No atrial level shunt detected by color flow Doppler.  LEFT VENTRICLE PLAX 2D LV EF:         Left            Diastology                ventricular     LV e' medial:    6.99 cm/s                ejection        LV E/e' medial:  9.8                fraction by     LV e' lateral:   9.48 cm/s                PLAX is 67      LV E/e' lateral: 7.2                %.  LVIDd:         3.80 cm LVIDs:         2.40 cm LV PW:         0.90 cm LV IVS:        1.00 cm LVOT diam:     1.40 cm LV SV:         23 LV SV Index:   12 LVOT Area:     1.54 cm  RIGHT VENTRICLE RV S prime:     13.70 cm/s TAPSE (M-mode): 1.2 cm LEFT ATRIUM              Index        RIGHT ATRIUM          Index LA diam:        3.00 cm  1.60 cm/m   RA  Area:     8.97 cm LA Vol (A2C):   110.0 ml 58.58 ml/m  RA Volume:   17.00 ml 9.05 ml/m LA Vol (A4C):   59.0 ml  31.42 ml/m LA Biplane Vol: 82.9 ml  44.15 ml/m  AORTIC VALVE             PULMONIC VALVE LVOT Vmax:   74.10 cm/s  PR End Diast Vel: 2.13 msec LVOT Vmean:  49.900 cm/s LVOT VTI:    0.151 m AI PHT:      698 msec  AORTA Ao Root diam: 4.00 cm Ao Asc diam:  4.90 cm MITRAL VALVE               TRICUSPID VALVE MV Area (PHT): 3.42 cm    TR Peak grad:   31.6 mmHg MV Decel Time: 222 msec    TR Vmax:        281.00 cm/s MR Peak grad: 68.2 mmHg MR Vmax:      413.00 cm/s  SHUNTS MV E velocity: 68.30 cm/s  Systemic VTI:  0.15 m MV A velocity: 28.80 cm/s  Systemic Diam: 1.40 cm MV E/A ratio:  2.37 Public relations account executive signed by Floydene Flock Signature Date/Time: 10/12/2022/1:42:10 PM    Final    DG Chest 1 View  Result Date: 10/12/2022 CLINICAL DATA:  CHF EXAM: CHEST  1 VIEW COMPARISON:  10/10/2022. FINDINGS: Cardiac silhouette is prominent. There is mild vascular congestion without focal consolidation. No pneumothorax or pleural effusion. Aorta is calcified and dilated. IMPRESSION: Mild changes of CHF. Electronically Signed   By: Sammie Bench M.D.   On: 10/12/2022 10:36   EEG adult  Result Date: 10/11/2022 Lora Havens, MD     10/11/2022  4:52 PM Patient Name: [name] MRN: [id number] Epilepsy Attending: Lora Havens Referring Physician/Provider: Lequita Halt, MD Date: 10/11/2022 Duration: 22.14 mins Patient history: 89yo F with ams. EEG to evaluate for seizure. Level of alertness: Awake AEDs during EEG study: GBP Technical aspects: This  EEG study was done with scalp electrodes positioned according to the 10-20 International system of electrode placement. Electrical activity was reviewed with band pass filter of 1-'70Hz'$ , sensitivity of 7 uV/mm, display speed of 47m/sec with a '60Hz'$  notched filter applied as appropriate. EEG data were recorded continuously and digitally stored.  Video monitoring was available and reviewed as appropriate. Description: The posterior dominant rhythm consists of 8-9 Hz activity of moderate voltage (25-35 uV) seen predominantly in posterior head regions, symmetric and reactive to eye opening and eye closing. EEG showed intermittent generalized 3 to 6 Hz theta-delta slowing. Hyperventilation and photic stimulation were not performed.   ABNORMALITY - Intermittent slow, generalized IMPRESSION: This study is suggestive of mild diffuse encephalopathy, nonspecific etiology. No seizures or epileptiform discharges were seen throughout the recording. PLora Havens  MR BRAIN WO CONTRAST  Result Date: 10/11/2022 CLINICAL DATA:  Mental status change.  Unknown cause. EXAM: MRI HEAD WITHOUT CONTRAST TECHNIQUE: Multiplanar, multiecho pulse sequences of the brain and surrounding structures were obtained without intravenous contrast. COMPARISON:  CT head 10/10/2022.  MR head 08/24/2016 FINDINGS: Brain: A heterogeneous extra-axial mass lesion adjacent to the frontal lobe in the anterior right cranial fossa demonstrates some interval growth. The lesion now measures 2.6 x 2.2 x 2.4 cm (AP x CC x TR). The lesion previously measured 2.3 x 1.8 x 2.3 cm. Confluent periventricular T2 hyperintensities are again seen. Remote lacunar infarcts are again noted in the corona radiata bilaterally.  Scattered subcortical T2 hyperintensities have progressed. Chronic ischemic changes again noted within the thalami and brainstem. The internal auditory canals are within normal limits. Vascular: Flow is present in the major intracranial arteries. Skull  and upper cervical spine: The craniocervical junction is normal. Upper cervical spine is within normal limits. Marrow signal is unremarkable. Sinuses/Orbits: Posterior ethmoid air cells and sphenoid sinuses are opacified bilaterally. Restricted diffusion raises concern for infection. The left maxillary sinus is near completely opacified. A fluid level is present. Left frontal sinus is opacified. Small left mastoid effusion is present. No obstructing nasopharyngeal lesion is present. Bilateral lens replacements are noted. Globes and orbits are otherwise unremarkable. IMPRESSION: 1. Interval growth of extra-axial mass lesion adjacent to the anterior right frontal lobe now measuring 2.6 x 2.2 x 2.4 cm. This is most consistent with a meningioma. 2. Significant progression in surrounding vasogenic edema and local mass effect. 3. Progressive diffuse white matter disease likely reflects the sequela of chronic microvascular ischemia. 4. Diffuse paranasal sinus disease, left greater than right. Restricted diffusion raises concern for infectious sinusitis. No sinus expansion or osseous invasion is evident. These results will be called to the ordering clinician or representative by the Radiologist Assistant, and communication documented in the PACS or [employer]. Electronically Signed   By: San Morelle M.D.   On: 10/11/2022 13:39   CT HEAD WO CONTRAST  Result Date: 10/10/2022 CLINICAL DATA:  Altered mental status EXAM: CT HEAD WITHOUT CONTRAST TECHNIQUE: Contiguous axial images were obtained from the base of the skull through the vertex without intravenous contrast. RADIATION DOSE REDUCTION: This exam was performed according to the departmental dose-optimization program which includes automated exposure control, adjustment of the mA and/or kV according to patient size and/or use of iterative reconstruction technique. COMPARISON:  Previous studies including the CT done on 2021/05/16 FINDINGS: Brain: These  2.5 x 2.3 cm smooth marginated partly calcified mass inseparable from the meninges in the right frontal region with surrounding vasogenic edema. This lesion has been seen in multiple previous studies dating as far back as 01/08/2007. Vasogenic edema around the lesion is slightly more prominent in the current study. There are no signs of bleeding within the cranium. Cortical sulci are prominent. There is no significant shift of midline structures. Ventricles are unremarkable. Vascular: Unremarkable. Skull: Unremarkable. Sinuses/Orbits: There is almost complete opacification in frontal, ethmoid and sphenoid sinuses. There residual worsening of this finding since 2021-05-16. Other: None. IMPRESSION: There is 2.5 cm partly calcified mass in the right frontal region, most likely meningioma. There is vasogenic edema around this lesion which appears slightly more prominent in comparison with the immediate previous examination of 05-16-21. There are no signs of bleeding within the cranium. Atrophy. Small-vessel disease. Electronically Signed   By: Elmer Picker M.D.   On: 10/10/2022 14:07   DG Chest 1 View  Result Date: 10/10/2022 CLINICAL DATA:  Cough. EXAM: CHEST  1 VIEW COMPARISON:  08/11/2021 FINDINGS: Enlarged cardiac silhouette with a mild increase in size. Tortuous and partially calcified thoracic aorta. Possible hiatal hernia. Clear lungs. Mildly prominent pulmonary vasculature. Unremarkable bones. IMPRESSION: 1. Mild cardiomegaly and mild pulmonary vascular congestion. 2. Possible hiatal hernia. Electronically Signed   By: Claudie Revering M.D.   On: 10/10/2022 13:58     TODAY-DAY OF DISCHARGE:  Subjective:   [name] today has no headache,no chest abdominal pain,no new weakness tingling or numbness, feels much better wants to go home today.   Objective:   Blood pressure 134/79, pulse 64, temperature  97.9 F (36.6 C), temperature source Oral, resp. rate (!) 21, height '5\' 7"'$  (1.702 m), weight  76.2 kg, SpO2 96 %.  Intake/Output Summary (Last 24 hours) at 10/14/2022 1440 Last data filed at 10/14/2022 1241 Gross per 24 hour  Intake 2097 ml  Output --  Net 2097 ml   Filed Weights   10/10/22 2008  Weight: 76.2 kg    Exam: Awake Alert, Oriented *3, No new F.N deficits, Normal affect [place].AT,PERRAL Supple Neck,No JVD, No cervical lymphadenopathy appriciated.  Symmetrical Chest wall movement, Good air movement bilaterally, CTAB RRR,No Gallops,Rubs or new Murmurs, No Parasternal Heave +ve B.Sounds, Abd Soft, Non tender, No organomegaly appriciated, No rebound -guarding or rigidity. No Cyanosis, Clubbing or edema, No new Rash or bruise   PERTINENT RADIOLOGIC STUDIES: CT RENAL STONE STUDY  Result Date: 10/12/2022 CLINICAL DATA:  Hematuria EXAM: CT ABDOMEN AND PELVIS WITHOUT CONTRAST TECHNIQUE: Multidetector CT imaging of the abdomen and pelvis was performed following the standard protocol without IV contrast. RADIATION DOSE REDUCTION: This exam was performed according to the departmental dose-optimization program which includes automated exposure control, adjustment of the mA and/or kV according to patient size and/or use of iterative reconstruction technique. COMPARISON:  None Available. FINDINGS: Lower chest: Heart is enlarged in size. There are small linear patchy densities in both lower lung fields. Calcifications are noted in pericardium. Hepatobiliary: Beam hardening artifacts are partly obscuring the liver. No definite focal abnormalities are seen. There is no dilation of bile ducts. Gallbladder stones are seen. Pancreas: No focal abnormalities are seen. Spleen: Unremarkable. Adrenals/Urinary Tract: Adrenals are unremarkable. There is no hydronephrosis. There is 4 mm calculus in left renal pelvis. Ureters are unremarkable. Urinary bladder is not distended. Urinary bladder is mostly obscured by beam hardening artifacts. Stomach/Bowel: There is moderate sized fixed hiatal hernia. Small  bowel loops are not dilated. The appendix is not distinctly seen. There is no pericecal inflammation. Scattered diverticula are seen in colon without signs of focal acute diverticulitis. Moderate amount of stool is seen in colon. There is large amount of stool in rectum. Transverse diameter of rectum measures 7.4 cm. Vascular/Lymphatic: Scattered arterial calcifications are seen. Reproductive: Small calcified uterine fibroid is seen. Other: There is no ascites or pneumoperitoneum. Bilateral inguinal hernias containing fat are seen, larger on the left side. Musculoskeletal: There is arthroplasty in right hip. There is 60% decrease in height of body of T12 vertebra. There is retropulsion in the posterior margin of T12 vertebral body. There is first-degree spondylolisthesis at the L4-L5 level. Severe degenerative changes are noted with disc space narrowing, bony spurs, facet hypertrophy, spinal stenosis and encroachment of neural foramina at multiple levels. IMPRESSION: There is no evidence of intestinal obstruction or pneumoperitoneum. There is no hydronephrosis. There is 4 mm calculus in the left renal pelvis. Gallbladder stones.  There is no dilation of bile ducts. Diverticulosis of colon without signs of focal diverticulitis. There is fecal impaction in rectum. Moderate sized hiatal hernia. Lumbar spondylosis. Decrease in height of body of T12 vertebra may be old compression fracture. Less likely possibility would be recent compression. Please correlate with clinical symptoms. Aortic atherosclerosis. Other findings as described in the body of the report. Electronically Signed   By: Elmer Picker M.D.   On: 10/12/2022 15:39     PERTINENT LAB RESULTS: CBC: Recent Labs    10/12/22 0313 10/13/22 0502  WBC 5.2 6.6  HGB 14.1 12.8  HCT 41.8 40.3  PLT 168 183   CMET CMP  Component Value Date/Time   NA 136 10/13/2022 0502   K 4.3 10/13/2022 0502   CL 106 10/13/2022 0502   CO2 20 (L) 10/13/2022  0502   GLUCOSE 157 (H) 10/13/2022 0502   BUN 35 (H) 10/13/2022 0502   CREATININE 0.87 10/13/2022 0502   CALCIUM 8.8 (L) 10/13/2022 0502   PROT 7.2 10/10/2022 1057   ALBUMIN 4.1 10/10/2022 1057   AST 15 10/10/2022 1057   ALT 11 10/10/2022 1057   ALKPHOS 46 10/10/2022 1057   BILITOT 0.8 10/10/2022 1057   GFRNONAA >60 10/13/2022 0502   GFRAA >60 08/24/2016 1125    GFR Estimated Creatinine Clearance: 43.9 mL/min (by C-G formula based on SCr of 0.87 mg/dL). No results for input(s): "LIPASE", "AMYLASE" in the last 72 hours. No results for input(s): "CKTOTAL", "CKMB", "CKMBINDEX", "TROPONINI" in the last 72 hours. Invalid input(s): "POCBNP" No results for input(s): "DDIMER" in the last 72 hours. No results for input(s): "HGBA1C" in the last 72 hours. No results for input(s): "CHOL", "HDL", "LDLCALC", "TRIG", "CHOLHDL", "LDLDIRECT" in the last 72 hours. No results for input(s): "TSH", "T4TOTAL", "T3FREE", "THYROIDAB" in the last 72 hours.  Invalid input(s): "FREET3"  No results for input(s): "VITAMINB12", "FOLATE", "FERRITIN", "TIBC", "IRON", "RETICCTPCT" in the last 72 hours.  Coags: Recent Labs    10/13/22 0502 10/14/22 0402  INR 3.1* 3.8*   Microbiology: Recent Results (from the past 240 hour(s))  Resp panel by RT-PCR (RSV, Flu A&B, Covid) Anterior Nasal Swab     Status: None   Collection Time: 10/10/22 10:57 AM   Specimen: Anterior Nasal Swab  Result Value Ref Range Status   SARS Coronavirus 2 by RT PCR NEGATIVE NEGATIVE Final    Comment: (NOTE) SARS-CoV-2 target nucleic acids are NOT DETECTED.  The SARS-CoV-2 RNA is generally detectable in upper respiratory specimens during the acute phase of infection. The lowest concentration of SARS-CoV-2 viral copies this assay can detect is 138 copies/mL. A negative result does not preclude SARS-Cov-2 infection and should not be used as the sole basis for treatment or other patient management decisions. A negative result may occur  with  improper specimen collection/handling, submission of specimen other than nasopharyngeal swab, presence of viral mutation(s) within the areas targeted by this assay, and inadequate number of viral copies(<138 copies/mL). A negative result must be combined with clinical observations, patient history, and epidemiological information. The expected result is Negative.  Fact Sheet for Patients:  EntrepreneurPulse.com.au  Fact Sheet for Healthcare Providers:  IncredibleEmployment.be  This test is no t yet approved or cleared by the [place] FDA and  has been authorized for detection and/or diagnosis of SARS-CoV-2 by FDA under an Emergency Use Authorization (EUA). This EUA will remain  in effect (meaning this test can be used) for the duration of the COVID-19 declaration under Section 564(b)(1) of the Act, 21 U.S.C.section 360bbb-3(b)(1), unless the authorization is terminated  or revoked sooner.       Influenza A by PCR NEGATIVE NEGATIVE Final   Influenza B by PCR NEGATIVE NEGATIVE Final    Comment: (NOTE) The Xpert Xpress SARS-CoV-2/FLU/RSV plus assay is intended as an aid in the diagnosis of influenza from Nasopharyngeal swab specimens and should not be used as a sole basis for treatment. Nasal washings and aspirates are unacceptable for Xpert Xpress SARS-CoV-2/FLU/RSV testing.  Fact Sheet for Patients: EntrepreneurPulse.com.au  Fact Sheet for Healthcare Providers: IncredibleEmployment.be  This test is not yet approved or cleared by the [place] and has been authorized for  detection and/or diagnosis of SARS-CoV-2 by FDA under an Emergency Use Authorization (EUA). This EUA will remain in effect (meaning this test can be used) for the duration of the COVID-19 declaration under Section 564(b)(1) of the Act, 21 U.S.C. section 360bbb-3(b)(1), unless the authorization is terminated  or revoked.     Resp Syncytial Virus by PCR NEGATIVE NEGATIVE Final    Comment: (NOTE) Fact Sheet for Patients: EntrepreneurPulse.com.au  Fact Sheet for Healthcare Providers: IncredibleEmployment.be  This test is not yet approved or cleared by the [place] FDA and has been authorized for detection and/or diagnosis of SARS-CoV-2 by FDA under an Emergency Use Authorization (EUA). This EUA will remain in effect (meaning this test can be used) for the duration of the COVID-19 declaration under Section 564(b)(1) of the Act, 21 U.S.C. section 360bbb-3(b)(1), unless the authorization is terminated or revoked.  Performed at [facility], [address] [place], [place] [address]   Urine Culture     Status: Abnormal   Collection Time: 10/10/22  1:23 PM   Specimen: Urine, Clean Catch  Result Value Ref Range Status   Specimen Description   Final    URINE, CLEAN CATCH Performed at [facility], [address] [place], [place] [address]    Special Requests   Final    NONE Performed at [facility], [address], [place], [place] [address]    Culture (A)  Final    20,000 COLONIES/mL STREPTOCOCCUS GALLOLYTICUS Standardized susceptibility testing for this organism is not available. Performed at [facility], [place] [address], [place],  [address]    Report Status 10/13/2022 FINAL  Final    FURTHER DISCHARGE INSTRUCTIONS:  Get Medicines reviewed and adjusted: Please take all your medications with you for your next visit with your Primary MD  Laboratory/radiological data: Please request your Primary MD to go over all hospital tests and procedure/radiological results at the follow up, please ask your Primary MD to get all Hospital records sent to his/her office.  In some cases, they will be blood work, cultures and biopsy results pending at the time of your discharge.  Please request that your primary care M.D. goes through all the records of your hospital data and follows up on these results.  Also Note the following: If you experience worsening of your admission symptoms, develop shortness of breath, life threatening emergency, suicidal or homicidal thoughts you must seek medical attention immediately by calling 911 or calling your MD immediately  if symptoms less severe.  You must read complete instructions/literature along with all the possible adverse reactions/side effects for all the Medicines you take and that have been prescribed to you. Take any new Medicines after you have completely understood and accpet all the possible adverse reactions/side effects.   Do not drive when taking Pain medications or sleeping medications (Benzodaizepines)  Do not take more than prescribed Pain, Sleep and Anxiety Medications. It is not advisable to combine anxiety,sleep and pain medications without talking with your primary care practitioner  Special Instructions: If you have smoked or chewed Tobacco  in the last 2 yrs please stop smoking, stop any regular Alcohol  and or any Recreational drug use.  Wear Seat belts while driving.  Please note: You were cared for by a hospitalist during your hospital stay. Once you are discharged, your primary care physician will handle any further medical issues. Please note that NO REFILLS for any discharge medications will be authorized once you are discharged, as it  is imperative that you return to your primary care physician (or establish a relationship with a primary care physician if you do not have one) for your post hospital discharge needs so that they can reassess your need for medications and monitor your lab values.  Total Time spent coordinating discharge including counseling, education and face to face time equals greater than 30 minutes.  SignedOren Binet 10/14/2022 2:40 PM

## 2022-10-21 ENCOUNTER — Telehealth: Payer: Self-pay

## 2022-10-21 DIAGNOSIS — R627 Adult failure to thrive: Secondary | ICD-10-CM

## 2022-10-21 DIAGNOSIS — I4819 Other persistent atrial fibrillation: Secondary | ICD-10-CM

## 2022-10-21 DIAGNOSIS — I5032 Chronic diastolic (congestive) heart failure: Secondary | ICD-10-CM

## 2022-10-21 DIAGNOSIS — Z66 Do not resuscitate: Secondary | ICD-10-CM

## 2022-10-21 DIAGNOSIS — F02B0 Dementia in other diseases classified elsewhere, moderate, without behavioral disturbance, psychotic disturbance, mood disturbance, and anxiety: Secondary | ICD-10-CM

## 2022-10-21 NOTE — Telephone Encounter (Signed)
Patient daughter called and stated that her mother, (patient) had an INR was 1.2 and she started her on Eliquis 2.'5mg'$  last night and a dose this morning. Patient's provider stopped the Warfarin and started her on Eliquis. Daughter is asking for a call ASAP because she knows that her mother is dying, and doesn't want to make things worse for patient. Patient is too weak to come in. Please call Marcie Bal at [phone].

## 2022-10-21 NOTE — Telephone Encounter (Signed)
ICD-10-CM   1. Failure to thrive in adult  R62.7 Ambulatory referral to Hospice    2. Chronic diastolic heart failure ([place])  I50.32 Ambulatory referral to Hospice    3. Persistent atrial fibrillation ([place])  I48.19 Ambulatory referral to Hospice    4. Moderate late onset Alzheimer's dementia without behavioral disturbance, psychotic disturbance, mood disturbance, or anxiety (HCC)  G30.1 Ambulatory referral to [email or web address]     5. DNR (do not resuscitate)  Z66      Orders Placed This Encounter  Procedures   Ambulatory referral to Hospice    Referral Priority:   Routine    Referral Type:   Consultation    Referral Reason:   Specialty Services Required    Requested Specialty:   Hospice Services    Number of Visits Requested:   1    Adrian Prows, MD, [facility] 10/21/2022, 5:47 PM Office: [phone] Fax: [phone] Pager: [phone]

## 2022-10-22 ENCOUNTER — Telehealth: Payer: Self-pay

## 2022-10-22 NOTE — Telephone Encounter (Signed)
(  4:03 pm) PC SW left a voice message for patient and her daughter requesting a call back to discuss palliative care and schedule an initial visit.

## 2022-10-23 ENCOUNTER — Telehealth: Payer: Self-pay | Admitting: Cardiology

## 2022-10-23 DIAGNOSIS — F02B0 Dementia in other diseases classified elsewhere, moderate, without behavioral disturbance, psychotic disturbance, mood disturbance, and anxiety: Secondary | ICD-10-CM

## 2022-10-23 DIAGNOSIS — I5033 Acute on chronic diastolic (congestive) heart failure: Secondary | ICD-10-CM

## 2022-10-23 DIAGNOSIS — Z515 Encounter for palliative care: Secondary | ICD-10-CM

## 2022-10-23 DIAGNOSIS — R627 Adult failure to thrive: Secondary | ICD-10-CM

## 2022-10-23 MED ORDER — FUROSEMIDE 20 MG PO TABS: 20.0000 mg | ORAL_TABLET | Freq: Two times a day (BID) | ORAL | 0 refills | Status: AC | PRN

## 2022-10-23 MED ORDER — FENTANYL 25 MCG/HR TD PT72: 1.0000 | MEDICATED_PATCH | TRANSDERMAL | 0 refills | Status: AC

## 2022-10-23 NOTE — Telephone Encounter (Signed)
I had a 20-minute discussion with the patient's daughter on 10/21/2022 regarding patient's recent ED evaluation and hospitalization, patient has taken a downward turn towards being extremely confused, dementia setting again, very weak and frail and very unstable on her gait with high likelihood of frequent fall.  After long discussion, we have agreed that she should be made every palliative care/comfort care patient at this time.  I again spoke to patient's daughter today as she contacted me stating that her mom appears to be in heart failure with gurgling noises in her lungs, her daughter is an Therapist, sports.  I have sent in for furosemide prescription.  Patient has chronic arthritis and is in pain, I will send fentanyl patches to be used as she will be made comfort care.  I have already made consultation for palliative/comfort care, team will be visiting her tomorrow at 1:30 PM on 10/24/2022.  Patient's daughter has discontinued after my discussion, all the medications except aspirin 81 mg daily.  I agree with this.     ICD-10-CM   1. Hospice care patient  Z51.5 fentaNYL (DURAGESIC) 25 MCG/HR    furosemide (LASIX) 20 MG tablet    2. Acute on chronic diastolic heart failure (HCC)  I50.33 fentaNYL (DURAGESIC) 25 MCG/HR    furosemide (LASIX) 20 MG tablet    3. Moderate late onset Alzheimer's dementia without behavioral disturbance, psychotic disturbance, mood disturbance, or anxiety (HCC)  G30.1    F02.B0     4. Failure to thrive in adult  R62.7       Meds ordered this encounter  Medications   fentaNYL (DURAGESIC) 25 MCG/HR    Sig: Place 1 patch onto the skin every 3 (three) days.    Dispense:  4 patch    Refill:  0   furosemide (LASIX) 20 MG tablet    Sig: Take 1 tablet (20 mg total) by mouth 2 (two) times daily as needed for up to 15 days for fluid.    Dispense:  30 tablet    Refill:  0    Total time spent 30 minutes.   Adrian Prows, MD, [facility] 10/23/2022, 4:20 PM Office: [phone] Fax:  [phone] Pager: [phone]

## 2022-10-25 ENCOUNTER — Other Ambulatory Visit: Payer: Self-pay | Admitting: Cardiology

## 2022-10-25 DIAGNOSIS — R627 Adult failure to thrive: Secondary | ICD-10-CM

## 2022-10-25 DIAGNOSIS — Z515 Encounter for palliative care: Secondary | ICD-10-CM

## 2022-10-25 MED ORDER — MORPHINE SULFATE 20 MG/5ML PO SOLN: 2.5000 mg | ORAL | 0 refills | Status: DC | PRN

## 2022-10-25 MED ORDER — MORPHINE SULFATE 20 MG/5ML PO SOLN: 2.5000 mg | ORAL | 0 refills | Status: AC | PRN

## 2022-10-25 NOTE — Progress Notes (Signed)
ICD-10-CM   1. Hospice care patient  Z51.5 morphine 20 MG/5ML solution    2. Failure to thrive in adult  R62.7 morphine 20 MG/5ML solution     Meds ordered this encounter  Medications   morphine 20 MG/5ML solution    Sig: Take 0.6 mLs (2.4 mg total) by mouth every 2 (two) hours as needed for pain.    Dispense:  15 mL    Refill:  0

## 2022-10-25 NOTE — Progress Notes (Signed)
No orders of the defined types were placed in this encounter.   Meds ordered this encounter  Medications   morphine 20 MG/5ML solution    Sig: Take 0.6 mLs (2.4 mg total) by mouth every 2 (two) hours as needed for pain.    Dispense:  15 mL    Refill:  0   Sent to Different pharmacy

## 2022-10-30 ENCOUNTER — Other Ambulatory Visit: Payer: Self-pay | Admitting: Cardiology

## 2022-10-30 DIAGNOSIS — I5033 Acute on chronic diastolic (congestive) heart failure: Secondary | ICD-10-CM

## 2022-10-30 DIAGNOSIS — Z515 Encounter for palliative care: Secondary | ICD-10-CM

## 2022-11-12 DEATH — deceased

## 2023-05-27 ENCOUNTER — Ambulatory Visit: Payer: Medicare Other | Admitting: Cardiology
